# Patient Record
Sex: Female | Born: 1985 | Race: White | Hispanic: No | Marital: Married | Smoking: Never smoker
Health system: Southern US, Community
[De-identification: ages and names within clinical notes are randomized; demographics above are authoritative.]

## PROBLEM LIST (undated history)

## (undated) DIAGNOSIS — F419 Anxiety disorder, unspecified: Secondary | ICD-10-CM

## (undated) DIAGNOSIS — F32A Depression, unspecified: Secondary | ICD-10-CM

## (undated) DIAGNOSIS — G35D Multiple sclerosis, unspecified: Secondary | ICD-10-CM

## (undated) DIAGNOSIS — G35 Multiple sclerosis: Secondary | ICD-10-CM

## (undated) DIAGNOSIS — R87629 Unspecified abnormal cytological findings in specimens from vagina: Secondary | ICD-10-CM

## (undated) DIAGNOSIS — K589 Irritable bowel syndrome without diarrhea: Secondary | ICD-10-CM

## (undated) DIAGNOSIS — H539 Unspecified visual disturbance: Secondary | ICD-10-CM

## (undated) HISTORY — DX: Unspecified visual disturbance: H53.9

## (undated) HISTORY — PX: WISDOM TOOTH EXTRACTION: SHX21

## (undated) HISTORY — PX: TYMPANOPLASTY: SHX33

## (undated) HISTORY — PX: LEEP: SHX91

## (undated) HISTORY — PX: MYRINGOTOMY: SUR874

---
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
## 2007-02-09 ENCOUNTER — Other Ambulatory Visit: Admission: RE | Admit: 2007-02-09 | Discharge: 2007-02-09 | Payer: Self-pay | Admitting: Gynecology

## 2007-08-08 ENCOUNTER — Encounter: Admission: RE | Admit: 2007-08-08 | Discharge: 2007-08-08 | Payer: Self-pay | Admitting: Gastroenterology

## 2007-08-15 ENCOUNTER — Other Ambulatory Visit: Admission: RE | Admit: 2007-08-15 | Discharge: 2007-08-15 | Payer: Self-pay | Admitting: Gynecology

## 2008-04-24 ENCOUNTER — Other Ambulatory Visit: Admission: RE | Admit: 2008-04-24 | Discharge: 2008-04-24 | Payer: Self-pay | Admitting: Gynecology

## 2010-02-20 DIAGNOSIS — N760 Acute vaginitis: Secondary | ICD-10-CM | POA: Insufficient documentation

## 2010-02-20 DIAGNOSIS — D649 Anemia, unspecified: Secondary | ICD-10-CM | POA: Insufficient documentation

## 2010-02-20 DIAGNOSIS — K5900 Constipation, unspecified: Secondary | ICD-10-CM | POA: Insufficient documentation

## 2010-02-20 DIAGNOSIS — K589 Irritable bowel syndrome without diarrhea: Secondary | ICD-10-CM | POA: Insufficient documentation

## 2014-07-01 ENCOUNTER — Encounter: Payer: Self-pay | Admitting: Gastroenterology

## 2014-08-14 ENCOUNTER — Ambulatory Visit: Payer: Self-pay | Admitting: Gastroenterology

## 2016-03-27 ENCOUNTER — Encounter (HOSPITAL_COMMUNITY): Payer: Self-pay | Admitting: *Deleted

## 2016-03-27 ENCOUNTER — Ambulatory Visit (HOSPITAL_COMMUNITY)
Admission: EM | Admit: 2016-03-27 | Discharge: 2016-03-27 | Disposition: A | Discharge status: Nursing Home (Includes ICF) | Payer: BLUE CROSS/BLUE SHIELD | Source: Home / Self Care | Attending: Emergency Medicine | Admitting: Emergency Medicine

## 2016-03-27 ENCOUNTER — Emergency Department (HOSPITAL_COMMUNITY): Payer: BLUE CROSS/BLUE SHIELD

## 2016-03-27 ENCOUNTER — Emergency Department (HOSPITAL_COMMUNITY)
Admission: EM | Admit: 2016-03-27 | Discharge: 2016-03-28 | Disposition: A | Discharge status: Home / Self Care | Payer: BLUE CROSS/BLUE SHIELD | Attending: Emergency Medicine | Admitting: Emergency Medicine

## 2016-03-27 ENCOUNTER — Encounter (HOSPITAL_COMMUNITY): Payer: Self-pay | Admitting: Emergency Medicine

## 2016-03-27 DIAGNOSIS — R112 Nausea with vomiting, unspecified: Secondary | ICD-10-CM

## 2016-03-27 DIAGNOSIS — R197 Diarrhea, unspecified: Secondary | ICD-10-CM | POA: Insufficient documentation

## 2016-03-27 DIAGNOSIS — R102 Pelvic and perineal pain: Secondary | ICD-10-CM

## 2016-03-27 DIAGNOSIS — R1011 Right upper quadrant pain: Secondary | ICD-10-CM

## 2016-03-27 DIAGNOSIS — N83202 Unspecified ovarian cyst, left side: Secondary | ICD-10-CM | POA: Insufficient documentation

## 2016-03-27 DIAGNOSIS — E876 Hypokalemia: Secondary | ICD-10-CM | POA: Insufficient documentation

## 2016-03-27 DIAGNOSIS — R509 Fever, unspecified: Secondary | ICD-10-CM

## 2016-03-27 DIAGNOSIS — R1031 Right lower quadrant pain: Secondary | ICD-10-CM | POA: Insufficient documentation

## 2016-03-27 DIAGNOSIS — M545 Low back pain: Secondary | ICD-10-CM | POA: Insufficient documentation

## 2016-03-27 LAB — COMPREHENSIVE METABOLIC PANEL
ALBUMIN: 3.9 g/dL (ref 3.5–5.0)
ALT: 17 U/L (ref 14–54)
AST: 24 U/L (ref 15–41)
Alkaline Phosphatase: 55 U/L (ref 38–126)
Anion gap: 9 (ref 5–15)
BILIRUBIN TOTAL: 0.6 mg/dL (ref 0.3–1.2)
BUN: 7 mg/dL (ref 6–20)
CO2: 21 mmol/L — AB (ref 22–32)
Calcium: 8.5 mg/dL — ABNORMAL LOW (ref 8.9–10.3)
Chloride: 100 mmol/L — ABNORMAL LOW (ref 101–111)
Creatinine, Ser: 0.61 mg/dL (ref 0.44–1.00)
GFR calc Af Amer: >60 mL/min (ref >60)
GFR calc non Af Amer: >60 mL/min (ref >60)
GLUCOSE: 112 mg/dL — AB (ref 65–99)
POTASSIUM: 3.1 mmol/L — AB (ref 3.5–5.1)
SODIUM: 130 mmol/L — AB (ref 135–145)
TOTAL PROTEIN: 7 g/dL (ref 6.5–8.1)

## 2016-03-27 LAB — I-STAT BETA HCG BLOOD, ED (MC, WL, AP ONLY): I-stat hCG, quantitative: <5 m[IU]/mL (ref <5)

## 2016-03-27 LAB — POCT URINALYSIS DIP (DEVICE)
BILIRUBIN URINE: NEGATIVE
GLUCOSE, UA: NEGATIVE mg/dL
HGB URINE DIPSTICK: NEGATIVE
Ketones, ur: NEGATIVE mg/dL
LEUKOCYTES UA: NEGATIVE
NITRITE: NEGATIVE
Protein, ur: 30 mg/dL — AB
SPECIFIC GRAVITY, URINE: 1.02 (ref 1.005–1.030)
Urobilinogen, UA: 0.2 mg/dL (ref 0.0–1.0)
pH: 6 (ref 5.0–8.0)

## 2016-03-27 LAB — CBC
HEMATOCRIT: 36.7 % (ref 36.0–46.0)
Hemoglobin: 12.6 g/dL (ref 12.0–15.0)
MCH: 29.6 pg (ref 26.0–34.0)
MCHC: 34.3 g/dL (ref 30.0–36.0)
MCV: 86.2 fL (ref 78.0–100.0)
Platelets: 197 10*3/uL (ref 150–400)
RBC: 4.26 MIL/uL (ref 3.87–5.11)
RDW: 12.5 % (ref 11.5–15.5)
WBC: 5.9 10*3/uL (ref 4.0–10.5)

## 2016-03-27 LAB — WET PREP, GENITAL
SPERM: NONE SEEN
Trich, Wet Prep: NONE SEEN
Yeast Wet Prep HPF POC: NONE SEEN

## 2016-03-27 LAB — POCT PREGNANCY, URINE: PREG TEST UR: NEGATIVE

## 2016-03-27 LAB — LIPASE, BLOOD: Lipase: 123 U/L — ABNORMAL HIGH (ref 11–51)

## 2016-03-27 MED ORDER — ACETAMINOPHEN 325 MG PO TABS
650.0000 mg | ORAL_TABLET | Freq: Once | ORAL | Status: AC
  Administered 2016-03-27: 650 mg via ORAL

## 2016-03-27 MED ORDER — ACETAMINOPHEN 325 MG PO TABS
ORAL_TABLET | ORAL | Status: AC
  Filled 2016-03-27: qty 1

## 2016-03-27 MED ORDER — ONDANSETRON 4 MG PO TBDP
4.0000 mg | ORAL_TABLET | Freq: Once | ORAL | Status: AC
  Administered 2016-03-27: 4 mg via ORAL

## 2016-03-27 MED ORDER — ONDANSETRON 4 MG PO TBDP
ORAL_TABLET | ORAL | Status: AC
Start: 2016-03-27 — End: 2016-03-27
  Filled 2016-03-27: qty 1

## 2016-03-27 MED ORDER — IBUPROFEN 400 MG PO TABS
400.0000 mg | ORAL_TABLET | Freq: Once | ORAL | Status: AC
  Administered 2016-03-27: 400 mg via ORAL
  Filled 2016-03-27: qty 1

## 2016-03-27 NOTE — ED Triage Notes (Signed)
Reports feeling tight and crampy in lower abdomen.  reports burning with urination. Denies vaginal discharge.  Symptoms started yesterday.  Reports vomiting and diarrhea until 10 this morning.  Nausea continues.  Having general aches

## 2016-03-27 NOTE — Discharge Instructions (Signed)
Please go directly to the emergency room. This could be either your gallbladder or appendix.

## 2016-03-27 NOTE — ED Notes (Signed)
Pt ambulated to restroom independently and back to room

## 2016-03-27 NOTE — ED Triage Notes (Signed)
Sent from urgent care.  Pt had lower abdominal pain last week and then resolved and now with right lower abdominal pain.  Tender to right upper abdominal area.She was given tylenol for fever and headache and now head feels better. Sent here to rule out appy and chole.

## 2016-03-27 NOTE — ED Triage Notes (Signed)
Here for UTI sx onset last night associated w/dysuria, n/v, abd pain, BA, back pain, fevers, chills  A&O x4... NAD

## 2016-03-27 NOTE — ED Provider Notes (Signed)
[facility]    CSN: [id number] Arrival date & time: 03/27/16  [id number]     History   Chief Complaint Chief Complaint  Patient presents with  . Urinary Tract Infection    HPI Brittany PhiKelley D Johnston is a 30 y.o. female.   HPI She is a 30 year old woman here for evaluation of abdominal pain. She states her symptoms started last night with nausea, vomiting, and diarrhea. Today, she developed body aches and pain in her lower back. She reports a little bit of discomfort with urination. She states she is hurting the worst in her right lower abdomen. No vomiting or diarrhea since 10:00 this morning. She has been able to tolerate Gatorade and ginger ale. She is febrile here. She has not taken any medication prior to arrival.  No past medical history on file.  There are no active problems to display for this patient.   History reviewed. No pertinent surgical history.  OB History    No data available       Home Medications    Prior to Admission medications   Not on File    Family History No family history on file.  Social History Social History  Substance Use Topics  . Smoking status: Never Smoker  . Smokeless tobacco: Never Used  . Alcohol use Yes     Allergies   Patient has no known allergies.   Review of Systems Review of Systems As in history of present illness  Physical Exam Triage Vital Signs ED Triage Vitals [03/27/16 1921]  Enc Vitals Group     BP 108/67     Pulse Rate 110     Resp 20     Temp 102.2 F (39 C)     Temp Source Oral     SpO2 100 %     Weight      Height      Head Circumference      Peak Flow      Pain Score 8     Pain Loc      Pain Edu?      Excl. in GC?    No data found.   Updated Vital Signs BP 108/67 (BP Location: Left Arm)   Pulse 110   Temp 102.2 F (39 C) (Oral)   Resp 20   LMP 03/14/2016   SpO2 100%   Visual Acuity Right Eye Distance:   Left Eye Distance:   Bilateral Distance:    Right Eye Near:     Left Eye Near:    Bilateral Near:     Physical Exam  Constitutional: She is oriented to person, place, and time. She appears well-developed and well-nourished. No distress.  Looks ill  Cardiovascular: Regular rhythm and normal heart sounds.   No murmur heard. Tachycardic  Pulmonary/Chest: Effort normal and breath sounds normal. No respiratory distress. She has no wheezes. She has no rales.  Abdominal: Soft. Bowel sounds are normal. She exhibits no distension. There is tenderness (RUQ and RLQ). There is tenderness at McBurney's point. There is no rebound and no guarding.  No CVA tenderness  Neurological: She is alert and oriented to person, place, and time.     UC Treatments / Results  Labs (all labs ordered are listed, but only abnormal results are displayed) Labs Reviewed  POCT URINALYSIS DIP (DEVICE) - Abnormal; Notable for the following:       Result Value   Protein, ur 30 (*)    All other components within normal  limits  URINE CULTURE    EKG  EKG Interpretation None       Radiology No results found.  Procedures Procedures (including critical care time)  Medications Ordered in UC Medications  acetaminophen (TYLENOL) tablet 650 mg (650 mg Oral Given 03/27/16 1952)  ondansetron (ZOFRAN-ODT) disintegrating tablet 4 mg (4 mg Oral Given 03/27/16 2035)     Initial Impression / Assessment and Plan / UC Course  I have reviewed the triage vital signs and the nursing notes.  Pertinent labs & imaging results that were available during my care of the patient were reviewed by me and considered in my medical decision making (see chart for details).  Clinical Course     Given fever and right upper quadrant and right lower quadrant pain, transfer to the emergency room via private vehicle to rule out cholecystitis or appendicitis. Tylenol and Zofran given here prior to transfer.  Final Clinical Impressions(s) / UC Diagnoses   Final diagnoses:  Fever, unspecified fever  cause  RLQ abdominal pain  RUQ pain    New Prescriptions New Prescriptions   No medications on file     Charm Rings, MD 03/27/16 2037

## 2016-03-27 NOTE — ED Notes (Signed)
Pt requested pain medicine for her headache. Pt last took 650mg  of tylenol at 1930. Elpidio Anis notified and gave verbal order for 400mg  of ibuprofen.

## 2016-03-27 NOTE — ED Notes (Signed)
Offered to get provider to explain concerns

## 2016-03-27 NOTE — ED Notes (Signed)
Pregnancy test is negative

## 2016-03-28 ENCOUNTER — Other Ambulatory Visit (HOSPITAL_COMMUNITY): Payer: Self-pay

## 2016-03-28 ENCOUNTER — Other Ambulatory Visit (HOSPITAL_COMMUNITY): Payer: BLUE CROSS/BLUE SHIELD

## 2016-03-28 MED ORDER — METRONIDAZOLE 0.75 % VA GEL: 1.0000 | Freq: Two times a day (BID) | VAGINAL | 0 refills | Status: DC

## 2016-03-28 MED ORDER — POTASSIUM CHLORIDE CRYS ER 20 MEQ PO TBCR: 20.0000 meq | EXTENDED_RELEASE_TABLET | Freq: Two times a day (BID) | ORAL | 0 refills | Status: DC

## 2016-03-28 MED ORDER — TRAMADOL HCL 50 MG PO TABS: 50.0000 mg | ORAL_TABLET | Freq: Four times a day (QID) | ORAL | 0 refills | Status: DC | PRN

## 2016-03-28 MED ORDER — NAPROXEN 500 MG PO TABS
500.0000 mg | ORAL_TABLET | Freq: Two times a day (BID) | ORAL | 0 refills | Status: DC
Start: 2016-03-28 — End: 2016-06-28

## 2016-03-28 NOTE — ED Notes (Signed)
Pt transported to US

## 2016-03-28 NOTE — ED Provider Notes (Signed)
[facility]   CSN: [id number] Arrival date & time: 03/27/16  2039     History   Chief Complaint Chief Complaint  Patient presents with  . Fever  . Abdominal Pain  . Nausea    HPI Brittany Johnston is a 30 y.o. female.  Patient presents with complaint of lower abdominal discomfort that started yesterday. No nausea, vomiting or diarrhea. She developed a fever with a reported Tmax of 102. No congestion, cough, sore throat. She localizes the abdominal discomfort to the lower abdomen. No vaginal discharge, dysuria or frequency. She also had pain across the low back. She was seen at Urgent Care prior to arrival and reports that on his exam she had tenderness in the RUQ. No aggravating or alleviating factors.    The history is provided by the patient. No language interpreter was used.  Fever   Pertinent negatives include no diarrhea and no vomiting.  Abdominal Pain   Associated symptoms include fever. Pertinent negatives include diarrhea, nausea, vomiting and dysuria.    History reviewed. No pertinent past medical history.  There are no active problems to display for this patient.   Past Surgical History:  Procedure Laterality Date  . LEEP     suppose to have follow up of cervical cells in January    OB History    No data available       Home Medications    Prior to Admission medications   Not on File    Family History No family history on file.  Social History Social History  Substance Use Topics  . Smoking status: Never Smoker  . Smokeless tobacco: Never Used  . Alcohol use Yes     Allergies   Patient has no known allergies.   Review of Systems Review of Systems  Constitutional: Positive for fever. Negative for chills.  HENT: Negative.   Respiratory: Negative.   Cardiovascular: Negative.   Gastrointestinal: Positive for abdominal pain. Negative for diarrhea, nausea and vomiting.  Genitourinary: Negative.  Negative for dysuria,  menstrual problem, pelvic pain and vaginal discharge.  Musculoskeletal: Positive for back pain.  Skin: Negative.   Neurological: Negative.      Physical Exam Updated Vital Signs BP 113/71   Pulse 83   Temp 98.7 F (37.1 C) (Oral)   Resp 16   Wt 66.1 kg   LMP 03/14/2016   SpO2 96%   Physical Exam  Constitutional: She appears well-developed and well-nourished.  HENT:  Head: Normocephalic.  Neck: Normal range of motion. Neck supple.  Cardiovascular: Normal rate and regular rhythm.   Pulmonary/Chest: Effort normal and breath sounds normal. She has no wheezes. She has no rales.  Abdominal: Soft. Bowel sounds are normal. There is no tenderness. There is no rebound and no guarding.  Genitourinary:  Genitourinary Comments: There is tenderness to cervix and a yellowish vaginal discharge present. Cervix is otherwise unremarkable in appearance. No adnexal mass or tenderness.   Musculoskeletal: Normal range of motion.  Neurological: She is alert. No cranial nerve deficit.  Skin: Skin is warm and dry.  Psychiatric: She has a normal mood and affect.     ED Treatments / Results  Labs (all labs ordered are listed, but only abnormal results are displayed) Labs Reviewed  WET PREP, GENITAL - Abnormal; Notable for the following:       Result Value   Clue Cells Wet Prep HPF POC PRESENT (*)    WBC, Wet Prep HPF POC MODERATE (*)  All other components within normal limits  LIPASE, BLOOD - Abnormal; Notable for the following:    Lipase 123 (*)    All other components within normal limits  COMPREHENSIVE METABOLIC PANEL - Abnormal; Notable for the following:    Sodium 130 (*)    Potassium 3.1 (*)    Chloride 100 (*)    CO2 21 (*)    Glucose, Bld 112 (*)    Calcium 8.5 (*)    All other components within normal limits  CBC  I-STAT BETA HCG BLOOD, ED ([facility], [facility], [facility] ONLY)  GC/CHLAMYDIA PROBE AMP ([place]) NOT AT [facility]   Results for orders placed or performed during the hospital  encounter of 03/27/16  Wet prep, genital  Result Value Ref Range   Yeast Wet Prep HPF POC NONE SEEN NONE SEEN   Trich, Wet Prep NONE SEEN NONE SEEN   Clue Cells Wet Prep HPF POC PRESENT (A) NONE SEEN   WBC, Wet Prep HPF POC MODERATE (A) NONE SEEN   Sperm NONE SEEN   Lipase, blood  Result Value Ref Range   Lipase 123 (H) 11 - 51 U/L  Comprehensive metabolic panel  Result Value Ref Range   Sodium 130 (L) 135 - 145 mmol/L   Potassium 3.1 (L) 3.5 - 5.1 mmol/L   Chloride 100 (L) 101 - 111 mmol/L   CO2 21 (L) 22 - 32 mmol/L   Glucose, Bld 112 (H) 65 - 99 mg/dL   BUN 7 6 - 20 mg/dL   Creatinine, Ser 4.09 0.44 - 1.00 mg/dL   Calcium 8.5 (L) 8.9 - 10.3 mg/dL   Total Protein 7.0 6.5 - 8.1 g/dL   Albumin 3.9 3.5 - 5.0 g/dL   AST 24 15 - 41 U/L   ALT 17 14 - 54 U/L   Alkaline Phosphatase 55 38 - 126 U/L   Total Bilirubin 0.6 0.3 - 1.2 mg/dL   GFR calc non Af Amer >60 >60 mL/min   GFR calc Af Amer >60 >60 mL/min   Anion gap 9 5 - 15  CBC  Result Value Ref Range   WBC 5.9 4.0 - 10.5 K/uL   RBC 4.26 3.87 - 5.11 MIL/uL   Hemoglobin 12.6 12.0 - 15.0 g/dL   HCT 81.1 91.4 - 78.2 %   MCV 86.2 78.0 - 100.0 fL   MCH 29.6 26.0 - 34.0 pg   MCHC 34.3 30.0 - 36.0 g/dL   RDW 95.6 21.3 - 08.6 %   Platelets 197 150 - 400 K/uL  I-Stat beta hCG blood, ED  Result Value Ref Range   I-stat hCG, quantitative <5.0 <5 mIU/mL   Comment 3            EKG  EKG Interpretation None       Radiology No results found. US Transvaginal Non-ob  Result Date: 03/28/2016 CLINICAL DATA:  Acute onset of lower pelvic pain. Initial encounter. EXAM: TRANSABDOMINAL AND TRANSVAGINAL ULTRASOUND OF PELVIS TECHNIQUE: Both transabdominal and transvaginal ultrasound examinations of the pelvis were performed. Transabdominal technique was performed for global imaging of the pelvis including uterus, ovaries, adnexal regions, and pelvic cul-de-sac. It was necessary to proceed with endovaginal exam following the  transabdominal exam to visualize the endometrium. COMPARISON:  None FINDINGS: Uterus Measurements: 7.3 x 3.6 x 5.7 cm. No fibroids or other mass visualized. Endometrium Thickness: 0.8 cm.  No focal abnormality visualized. Right ovary Measurements: 3.8 x 1.4 x 2.3 cm. Normal appearance/no adnexal mass. Left ovary Measurements: 4.0  x 3.7 x 3.1 cm. A complex 3.1 x 3.1 x 2.2 cm cyst is noted at the left ovary, with internal echoes. The appearance suggests a hemorrhagic cyst. Other findings Trace free fluid is seen within the pelvic cul-de-sac. IMPRESSION: 1. Suggestion of 3.1 cm hemorrhagic cyst at the left ovary. 2. Otherwise unremarkable pelvic ultrasound. No evidence for ovarian torsion. Electronically Signed   By: Roanna Raider M.D.   On: 03/28/2016 01:04   US Pelvis Complete  Result Date: 03/28/2016 CLINICAL DATA:  Acute onset of lower pelvic pain. Initial encounter. EXAM: TRANSABDOMINAL AND TRANSVAGINAL ULTRASOUND OF PELVIS TECHNIQUE: Both transabdominal and transvaginal ultrasound examinations of the pelvis were performed. Transabdominal technique was performed for global imaging of the pelvis including uterus, ovaries, adnexal regions, and pelvic cul-de-sac. It was necessary to proceed with endovaginal exam following the transabdominal exam to visualize the endometrium. COMPARISON:  None FINDINGS: Uterus Measurements: 7.3 x 3.6 x 5.7 cm. No fibroids or other mass visualized. Endometrium Thickness: 0.8 cm.  No focal abnormality visualized. Right ovary Measurements: 3.8 x 1.4 x 2.3 cm. Normal appearance/no adnexal mass. Left ovary Measurements: 4.0 x 3.7 x 3.1 cm. A complex 3.1 x 3.1 x 2.2 cm cyst is noted at the left ovary, with internal echoes. The appearance suggests a hemorrhagic cyst. Other findings Trace free fluid is seen within the pelvic cul-de-sac. IMPRESSION: 1. Suggestion of 3.1 cm hemorrhagic cyst at the left ovary. 2. Otherwise unremarkable pelvic ultrasound. No evidence for ovarian torsion.  Electronically Signed   By: Roanna Raider M.D.   On: 03/28/2016 01:04    Procedures Procedures (including critical care time)  Medications Ordered in ED Medications  ibuprofen (ADVIL,MOTRIN) tablet 400 mg (400 mg Oral Given 03/27/16 2352)     Initial Impression / Assessment and Plan / ED Course  I have reviewed the triage vital signs and the nursing notes.  Pertinent labs & imaging results that were available during my care of the patient were reviewed by me and considered in my medical decision making (see chart for details).  Clinical Course     Patient presents from Urgent Care with concern for lower abdominal pain and possible appendicitis.   On my exam, she is currently nontender to palpation in upper or lower abdomen. There is tenderness on pelvic exam and a vaginal discharge present. She is known to have cervical dysplasia, currently under GYN care.   Pelvic US was ordered to more fully evaluate. Will reassess after results.  Patient's US showing a left hemorrhagic ovarian cyst. She has been comfortable. No leukocytosis. No RUQ (or RLQ) tenderness. Doubt appendicitis or involvement of gall bladder. There is a cyst present and known cervical dysplasia. The patient is felt stable for discharge home. Return precautions of fever, significant RLQ tenderness, vomiting, were discussed. Patient and family comfortable with discharge.   Final Clinical Impressions(s) / ED Diagnoses   Final diagnoses:  Pelvic pain    New Prescriptions New Prescriptions   No medications on file     Danne Harbor 04/01/16 2311    Lyndal Pulley, MD 04/02/16 1309

## 2016-03-28 NOTE — Discharge Instructions (Signed)
Treat the fever with ibuprofen and/or tylenol. Take prescriptions as directed and return to the hospital if you have worsening symptoms.

## 2016-03-28 NOTE — ED Notes (Signed)
Pt verbalized understanding of d/c instructions and has no further questions. Pt stable and NAD. Pt to follow up with obgyn. VSS.

## 2016-03-29 LAB — URINE CULTURE

## 2016-03-31 LAB — GC/CHLAMYDIA PROBE AMP (~~LOC~~) NOT AT ARMC
Chlamydia: NEGATIVE
NEISSERIA GONORRHEA: NEGATIVE

## 2016-06-03 ENCOUNTER — Other Ambulatory Visit: Payer: Self-pay | Admitting: Ophthalmology

## 2016-06-03 DIAGNOSIS — H04129 Dry eye syndrome of unspecified lacrimal gland: Secondary | ICD-10-CM

## 2016-06-03 DIAGNOSIS — H16102 Unspecified superficial keratitis, left eye: Secondary | ICD-10-CM

## 2016-06-03 DIAGNOSIS — H5712 Ocular pain, left eye: Secondary | ICD-10-CM

## 2016-06-07 ENCOUNTER — Telehealth: Payer: Self-pay | Admitting: Neurology

## 2016-06-07 ENCOUNTER — Ambulatory Visit: Payer: Self-pay | Admitting: Neurology

## 2016-06-07 NOTE — Telephone Encounter (Signed)
This patient did not show for a new patient appointment today. 

## 2016-06-08 ENCOUNTER — Encounter: Payer: Self-pay | Admitting: Neurology

## 2016-06-21 ENCOUNTER — Telehealth: Payer: Self-pay | Admitting: *Deleted

## 2016-06-21 ENCOUNTER — Encounter: Payer: Self-pay | Admitting: *Deleted

## 2016-06-21 NOTE — Telephone Encounter (Signed)
Brittany Johnston called back stating she can come in to see Dr. Epimenio Foot on 06-28-16 at 1:30.

## 2016-06-21 NOTE — Telephone Encounter (Signed)
LMTC.  RAS has reviewed her MRI and would like for her to come in sooner than 07-12-16.  Will offer w/i appt. 06-28-16 at 1330, arrival time of 1300.  I have already reserved this time for her, and her March appt. remains as well, so just need to cancel whichever appt. she is not going to keep/fim

## 2016-06-21 NOTE — Telephone Encounter (Signed)
Noted/fim 

## 2016-06-28 ENCOUNTER — Encounter: Payer: Self-pay | Admitting: *Deleted

## 2016-06-28 ENCOUNTER — Ambulatory Visit (INDEPENDENT_AMBULATORY_CARE_PROVIDER_SITE_OTHER): Payer: BLUE CROSS/BLUE SHIELD | Admitting: Neurology

## 2016-06-28 ENCOUNTER — Encounter: Payer: Self-pay | Admitting: Neurology

## 2016-06-28 DIAGNOSIS — F329 Major depressive disorder, single episode, unspecified: Secondary | ICD-10-CM | POA: Diagnosis not present

## 2016-06-28 DIAGNOSIS — H469 Unspecified optic neuritis: Secondary | ICD-10-CM

## 2016-06-28 DIAGNOSIS — F32A Depression, unspecified: Secondary | ICD-10-CM

## 2016-06-28 DIAGNOSIS — Z79899 Other long term (current) drug therapy: Secondary | ICD-10-CM

## 2016-06-28 DIAGNOSIS — G35 Multiple sclerosis: Secondary | ICD-10-CM | POA: Diagnosis not present

## 2016-06-28 DIAGNOSIS — G35D Multiple sclerosis, unspecified: Secondary | ICD-10-CM | POA: Insufficient documentation

## 2016-06-28 HISTORY — DX: Unspecified optic neuritis: H46.9

## 2016-06-28 NOTE — Progress Notes (Signed)
[facility]  PATIENT: Brittany Johnston DOB: [birth year]  REFERRING DOCTOR OR PCP:  Aura Camps [facility])    Fax [phone] SOURCE: Patient, , records from [facility], MRI and lab reports, MRI images on PACS.  _________________________________   HISTORICAL  CHIEF COMPLAINT:  Chief Complaint  Patient presents with  . Abnormal MRI    Brittany Johnston is here wit hher mother Brittany Johnston to discuss abnormal MRI brain. Sts. onset of left eye pain mid January.  Opthalmologist dx. optic neuritis and sent her for an MRI brain, which is suspicious for MS.  Has had 5 dayas of IV steroids at [facility].  No LP.  Here to confirm dx. of MS. Denies eye pain but sts. vision is not back to baseline/fim    HISTORY OF PRESENT ILLNESS:  I had the pleasure seeing you patient, Brittany Johnston, at [facility] for neurologic consultation regarding her recent optic neuritis and abnormal MRI.  Around 05/20/2016, she had the onset of left eye pain, worse with movement.   Symptoms started while driving and she noted looking to merge was painful. She saw her ophthalmologist and she had an MRI 06/03/2016 showing optic nerve enhancement on the left and a second focus in the right hemisphere that also enhanced.     She noted mild blurry vision shortly after that but it seemed worse 06/05/2016 and the next day, vision was worse.    She went to the [facility] ED and was admitted for 5 days of IV Solu-Medrol (3 days in hospital, 2 as outpatient).  There, she had an MRI of the cervical spine that showed 2 more lesions (at C4C5 and T1) consistent with MS.  In retrospect, a few weeks before the visual symptoms, she noted right hand numbness, especially if she hold her phone which is persisting.   , She saw a holistic medicine doctor who told her that she was deficient and vitamin D and was hypothyroid and she was prescribed supplements. She was also prescribed low-dose naltrexone.    She saw Dr. Harlen Labs at  [facility].   Tysabri and ocrelizumab were discussed as possible treatments. Some initial blood work was performed and she is JCV antibody negative.   She is hepatitis B negative.  In early to mid January, she had the flu and was treated with Tamiflu. She was feeling better when her vision symptoms occurred.    Gait/strength/sensation:   She tripped a few times over the last week and felt less steady climbing the attic ladder recently.   However, she feels that strength and sensation are normal and symmetric in the legs. She has not noted clumsiness in the arms or legs. She does note some numbness in the right hand that comes and goes but has been occurring over the last month.  Bladder/Bowel:   She denies any bladder urgency, frequency or incontinence. She does not have hesitancy.   She has constipation and had a coloscopy at age 89.  Fatigue/Sleep:   She denies much fatigue.   She has some trouble falling asleep.  Once asleep, she sleeps well.  Mood/Cognition:  She feels depressed and has had some crying spells, worse since the diagnosis.  She would prefer not to go on an antidepressant at this point.   She feels she does not remember as well as she used to.    She feels focus and attention are worse.  There is no family history of MS or other autoimmune disease.  I personally reviewed the MRI of the brain dated 06/03/2016 and the MRI of the cervical spine dated 06/08/2016.   The MRI of the brain showed enhancement of the left optic nerve. Additionally in the right temporal lobes there is another T2/FLAIR hyperintense focus that enhances. Additionally there is a periventricular right cerebellar focus that does not enhance. I also reviewed the MRI of the cervical spine. At C3-C4 there is a right lateral focus with subtle enhancement. There is also a right posterolateral focus adjacent to T1 that does not enhance.   Lab reports from her recent hospital stay were also  evaluated. He is JCV antibody negative. Anti-NMO is negative.    REVIEW OF SYSTEMS: Constitutional: No fevers, chills, sweats, or change in appetite Eyes: No visual changes, double vision, eye pain Ear, nose and throat: No hearing loss, ear pain, nasal congestion, sore throat Cardiovascular: No chest pain, palpitations Respiratory: No shortness of breath at rest or with exertion.   No wheezes GastrointestinaI: No nausea, vomiting, diarrhea, abdominal pain, fecal incontinence,    Has constipation Genitourinary: No dysuria, urinary retention or frequency.  No nocturia.  Has had some yeast infections.  Musculoskeletal: No neck pain, back pain Integumentary: No rash, pruritus, skin lesions Neurological: as above Psychiatric: Noting some depression, worse with recent symptoms.   Endocrine: No palpitations, diaphoresis, change in appetite, change in weigh or increased thirst Hematologic/Lymphatic: No anemia, purpura, petechiae. Allergic/Immunologic: No itchy/runny eyes, nasal congestion, recent allergic reactions, rashes  ALLERGIES: No Known Allergies  HOME MEDICATIONS:  Current Outpatient Prescriptions:  .  Cholecalciferol (VITAMIN D3) 5000 units CAPS, Take by mouth., Disp: , Rfl:  .  lactobacillus acidophilus (BACID) TABS tablet, Take 2 tablets by mouth 3 (three) times daily., Disp: , Rfl:  .  UNABLE TO FIND, Methylcobalamin 1 mg daily, Disp: , Rfl:  .  zinc gluconate 50 MG tablet, Take 50 mg by mouth., Disp: , Rfl:  .  natalizumab (TYSABRI) 300 MG/15ML injection, Inject 300 mg into the vein every 30 (thirty) days., Disp: , Rfl:   PAST MEDICAL HISTORY: Past Medical History:  Diagnosis Date  . Vision abnormalities     PAST SURGICAL HISTORY: Past Surgical History:  Procedure Laterality Date  . LEEP     suppose to have follow up of cervical cells in January    FAMILY HISTORY: Family History  Problem Relation Age of Onset  . Heart disease Mother   . Healthy Father      SOCIAL HISTORY:  Social History   Social History  . Marital status: Single    Spouse name: N/A  . Number of children: N/A  . Years of education: N/A   Occupational History  . Not on file.   Social History Main Topics  . Smoking status: Never Smoker  . Smokeless tobacco: Never Used  . Alcohol use Yes     Comment: occasional  . Drug use: No  . Sexual activity: Not on file   Other Topics Concern  . Not on file   Social History Narrative  . No narrative on file     PHYSICAL EXAM  Vitals:   06/28/16 1326  BP: 104/75  Pulse: 77  Resp: 14  Weight: 134 lb (60.8 kg)  Height: 5\' 5"  (1.651 m)    Body mass index is 22.3 kg/m.   General: The patient is well-developed and well-nourished and in no acute distress  Eyes:  Funduscopic exam shows mild optic nerve edema OS and normal OD optic discs and  normal OU retinal vessels.   She is 20/100 OS and 20/30 OD with glasses  Neck: The neck is supple, no carotid bruits are noted.  The neck is nontender.  Cardiovascular: The heart has a regular rate and rhythm with a normal S1 and S2. There were no murmurs, gallops or rubs. Lungs are clear to auscultation.  Skin: Extremities are without significant edema.  Musculoskeletal:  Back is nontender  Neurologic Exam  Mental status: The patient is alert and oriented x 3 at the time of the examination. The patient has apparent normal recent and remote memory, with an apparently normal attention span and concentration ability.   Speech is normal.  Cranial nerves: Extraocular movements are full. She has a 2+ APD.   Left color vision is reduced.  Visual fields are full.  Facial symmetry is present. There is good facial sensation to soft touch bilaterally.Facial strength is normal.  Trapezius and sternocleidomastoid strength is normal. No dysarthria is noted.  The tongue is midline, and the patient has symmetric elevation of the soft palate. No obvious hearing deficits are  noted.  Motor:  Muscle bulk is normal.   Tone is normal. Strength is  5 / 5 in all 4 extremities.   Sensory: Sensory testing is intact to pinprick, soft touch and vibration sensation in all 4 extremities.  Coordination: Cerebellar testing reveals good finger-nose-finger and heel-to-shin bilaterally.  Gait and station: Station is normal.   Gait is normal. Tandem gait is normal. Romberg is negative.   Reflexes: Deep tendon reflexes are symmetric and normal bilaterally.   Plantar responses are flexor.     DIAGNOSTIC DATA (LABS, IMAGING, TESTING) - I reviewed patient records, labs, notes, testing and imaging myself where available.  Lab Results  Component Value Date   WBC 5.9 03/27/2016   HGB 12.6 03/27/2016   HCT 36.7 03/27/2016   MCV 86.2 03/27/2016   PLT 197 03/27/2016      Component Value Date/Time   NA 130 (L) 03/27/2016 2054   K 3.1 (L) 03/27/2016 2054   CL 100 (L) 03/27/2016 2054   CO2 21 (L) 03/27/2016 2054   GLUCOSE 112 (H) 03/27/2016 2054   BUN 7 03/27/2016 2054   CREATININE 0.61 03/27/2016 2054   CALCIUM 8.5 (L) 03/27/2016 2054   PROT 7.0 03/27/2016 2054   ALBUMIN 3.9 03/27/2016 2054   AST 24 03/27/2016 2054   ALT 17 03/27/2016 2054   ALKPHOS 55 03/27/2016 2054   BILITOT 0.6 03/27/2016 2054   GFRNONAA >60 03/27/2016 2054   GFRAA >60 03/27/2016 2054       ASSESSMENT AND PLAN  Multiple sclerosis (HCC) - Plan: Quantiferon tb gold assay (blood), Pan-ANCA, TSH  Optic neuritis - Plan: Quantiferon tb gold assay (blood), Pan-ANCA, TSH  High risk medication use  Depression, unspecified depression type   In summary, Ms. Christell Constant is a 31 year old woman with left optic neuritis who has MRIs of the brain and spinal cord consistent with multiple sclerosis. She has evidence of dissemination in space and time and has two lesions in the spinal cord, 2 in the brain (one adjacent to the fourth ventricle in the cerebellum and one right temporal enhancing lesion) and left  optic nerve enhancement.    She is better since receiving IV steroids but has not returned to baseline. He had a very long discussion today about MS symptoms, and treatment options. Because she has two lesions in the spinal cord, I believe we need to begin with an higher efficacious  medication as her first disease modifying therapy and recommend that we start either ocrelizumab or Tysabri. She is JCV antibody negative and I feel that Tysabri may be her best beginning option. She signed the service request form and we will send it in. She does have some depression which is partially reactive to her recent diagnosis if symptoms worsen any or do not improve over the next month or 2 I would recommend that she begin an antidepressant or seek counseling/psychology.     I will check a pan--Anka tests to make sure that she does not have polyarteritis nodosa that can mimic MS. I will also check a TSH as she was told she might have hypothyroidism.    She is advised to take vitamin D 5000 units daily.      She will return to see me for a regular follow-up visit in 3 months but return sooner for her infusions. She should call us if she has any new or worsening neurologic symptoms.  Thank you for asking me to see Mrs. Moore. Please let me know if I can be of further assistance with her or other patients in the future.     Charels Stambaugh A. Epimenio Foot, MD, PhD 06/28/2016, 4:00 PM Certified in Neurology, Clinical Neurophysiology, Sleep Medicine, Pain Medicine and Neuroimaging  [facility] [address], [address] [place], [place] [address] [phone]

## 2016-06-29 LAB — PAN-ANCA
ANCA Proteinase 3: <3.5 U/mL (ref 0.0–3.5)
Atypical pANCA: <1:20 {titer}
C-ANCA: <1:20 {titer}

## 2016-06-29 LAB — TSH: TSH: 1.48 u[IU]/mL (ref 0.450–4.500)

## 2016-07-01 LAB — QUANTIFERON IN TUBE
QFT TB AG MINUS NIL VALUE: 0 [IU]/mL
QUANTIFERON MITOGEN VALUE: 5.24 IU/mL
QUANTIFERON NIL VALUE: 0.02 [IU]/mL
QUANTIFERON TB AG VALUE: 0.02 [IU]/mL
QUANTIFERON TB GOLD: NEGATIVE

## 2016-07-01 LAB — QUANTIFERON TB GOLD ASSAY (BLOOD)

## 2016-07-02 ENCOUNTER — Telehealth: Payer: Self-pay | Admitting: *Deleted

## 2016-07-02 NOTE — Telephone Encounter (Signed)
-----   Message from Asa Lente, MD sent at 07/01/2016  6:49 PM EST ----- Please let her know that the pan-ANCA test was negative. The thyroid test was normal.   If we have not already done so we can send in the Tysabri form.

## 2016-07-02 NOTE — Telephone Encounter (Signed)
I have spoken with Brittany Johnston this morning and per RAS, reviewed lab results as below.  Tysabri form has already been completed and given to [place] in the infusion suite.  Pt. verbalized understanding of same and sts. she has already been contacted by [employer] and told that ins. approved Ty and she will be receiving a call to schedule/fim

## 2016-07-12 ENCOUNTER — Ambulatory Visit: Payer: Self-pay | Admitting: Neurology

## 2016-07-20 NOTE — Telephone Encounter (Signed)
Pt called stating that her insurance company is not approving the [facility]. Pt states that Dr Epimenio Foot would have to call them to schedule a Peer to Peer with their physician. Pt would like a call with outcome

## 2016-07-20 NOTE — Telephone Encounter (Signed)
Message printed and given to Tina in the infusion suite/fim 

## 2016-07-23 ENCOUNTER — Telehealth: Payer: Self-pay | Admitting: Neurology

## 2016-07-23 NOTE — Telephone Encounter (Signed)
I called [phone] to do Peer to Peer.    I was told Med Director who reviewed her case will not be available until Tuesday.  As she has an aggressive MS (MRI shows 2 enhancing lesions and she also has two foci in her spine), I requested review earlier.

## 2016-07-26 NOTE — Telephone Encounter (Signed)
I did "peer to peer" with a Wellsite geologist [phone]).    Dr. Luiz Blare     I discussed her aggressive features including 2 plaques in her spinal cord and 1 plaque in her cerebellum and there is medical necessity for a highly effective disease modifying therapy such as Tysabri.  .     She was unable to authorize Tysabri as their policy states that the patient should begin with less effective medications such as Copaxone.    She recommended that I appeal on behalf of the patient with an expedited review by a neurologist to [phone]  I called that number (10-15 min wait) and was put through to a total of 3 different people over the next 45 minutes without resolution of the issue. Eventually, I was given the phone #[phone] that I had spoken to on Friday to set up the initial peer-to-peer.     We'll try that number again tomorrow as they are closed right now.

## 2016-07-27 NOTE — Telephone Encounter (Signed)
I have spoken with Brittany Johnston again today--she sts. she has spoken with her ins. co. and has faxed them a release allowing RAS to act on her behalf in Tysabri appeal/fim

## 2016-07-27 NOTE — Telephone Encounter (Signed)
Yesterday I spoke to a Wellsite geologist (Dr. Luiz Blare) who is not a neurologist and she upheld the denial of Tysabri.   I then spoke to 3-4 more people over an hour and got nowhere on initiating her member appeal.  Today:  I called [phone]  At 915 am - -- I got  voicemail and LM for them to call me so I can schedule peer to peer with neurologist.  Id did not get a return call.  I called again at 12:50 and got through to a live person  In order for me to have her denial reviewedby a neurologist, the patient has to give permission for [place] to act on her behalf for a 'member appeal of her denial'.  She needs to call [phone] and let them know she wants to do a "member appeal for the denial of Tysabri" and that I can speak to them on her behalf.   (if she calls we should be able to have this occur much faster than if she does by mail)  Please call her and let her know the above and for her to use those words

## 2016-07-27 NOTE — Telephone Encounter (Signed)
Patient requesting a call to discuss as in previous message permission for Dr. Epimenio Foot to speak to ins. company on her behalf regarding an appeal for denial of Tysabri.

## 2016-07-27 NOTE — Telephone Encounter (Signed)
I have spoken with Brittany Johnston and per RAS, reviewed information with her.  She will call her ins. co. and give RAS permission to act on her behalf for a member appeal for the denial of Tysabri, and let me know once she has done this/fim

## 2016-07-28 NOTE — Telephone Encounter (Signed)
I lmom identified vm for the med. director's office (phone# [phone]), requesting a call back for Dr. Epimenio Foot to schedule expedited review with a neurologist for pt's Tysabri.  Left message that this is an urgent request, as pt. is not currently on any dmt, and therefore is at risk for relapse.  I left office # and also my cell phone #/fim

## 2016-08-02 NOTE — Telephone Encounter (Signed)
Patient calling to follow up on Tysabri appeal. She also states her right foot has been numb  X 2 days.

## 2016-08-03 NOTE — Telephone Encounter (Signed)
I have spoken with Brittany Johnston in the Med. Director's office, who sts. we must speak with someone in the appeals office.  She transferred me to a #, where I was on hold for 20 min.  Brittany Johnston then came back to the phone and stated that appeal has not been started, that pt. must refax hippa info to them.  She requested pt's phone #, which I have provided./fim

## 2016-08-03 NOTE — Telephone Encounter (Signed)
I have spoken with Brittany Johnston at [facility] [employer], who sts. pt. does not have rx. coverage with them. I then lmom for the med. director's office [phone]) requesting urgent call back, as pt. is currently experiencing active MS sx, has not been able to start a dmt due to insurance non-responsiveness./fim

## 2016-08-03 NOTE — Telephone Encounter (Signed)
I have spoken with Anisha L. at [facility] and req. to speak with the legal dept., in order to file a complaint in ref. to ins. companies lack of response to urgent appeal, leaving pt's MS untreated.  Anisha placed me on hold, then returned to the phone and sts. we are not able to speak with anyone in the legal dept. and only recourse is to go thru provider services. I have spoken with [name] and advised that [place], in the med. director's office should have contacted her this morning to ask her to refax hippa release.  She sts. nobody has contacted her.  She will contact her insurance co. now to see if she can expedite appeal/fim

## 2016-08-04 NOTE — Telephone Encounter (Signed)
Pt called to inform that she spoke with the insurance company on yesterday and she was told that they do not have record of Dr Epimenio Foot calling to appeal the denial of covering the Tysabri .  Pt said she was told that by calling on yesterday she initiated an appeal, she was told that a packet is being sent to her with the name and direct # of someone to assist in this, pt said when she gets this information she will call back and provide it.

## 2016-08-04 NOTE — Telephone Encounter (Signed)
I have spoken with Brittany Johnston this afternoon.  She sts. she is very frustrated--that her ins. company denied speaking with Dr. Epimenio Foot, denied that a request for appeal of Tysabri denial had been made. Sts. they denied receiving faxed consent from [place] last week, giving Dr. Epimenio Foot permission to appeal Tysabri denial on her behalf; that she would have to refax connsent, and that appeal process would take up to 30 days.  Pt. sts. she requested to speak with the legal dept. to file a complaint, but was told only attorneys can speak with legal dept.  Will speak with RAS and call pt. back, as he will not want her to go another month or longer without dmt for MS./fim

## 2016-08-04 NOTE — Telephone Encounter (Signed)
I have spoken with Brittany Johnston this evening and given w/I appt. with RAS tomrrow am 0930, to discuss tx. options/fim

## 2016-08-04 NOTE — Telephone Encounter (Signed)
I have made multiple phone calls to [facility] this afternoon, to try and determine what MS dmt they will approve immediately, as RAS wants to get pt. started on a therapy asap.  I have spoken with [employer], with request that someone review their formulary with me to see what would be covered.  Online, there are 3 formulary choices--I do not know which one pt. has access to.  Dr. Epimenio Foot has reviewed what he has been able to find online, but meds on the formulary do not appear to be up to date. (For ex., Su Hilt is on the formulary, and it is no longer on the market.)  I have expressed extreme frustration that this one question can't be answered by anyone I have spoken with, even Tamela Oddi in the pharmacy. Nobody has been able to even give approved MS dmt.  RAS is aware./fim

## 2016-08-05 ENCOUNTER — Encounter: Payer: Self-pay | Admitting: Neurology

## 2016-08-05 ENCOUNTER — Telehealth: Payer: Self-pay | Admitting: *Deleted

## 2016-08-05 ENCOUNTER — Ambulatory Visit (INDEPENDENT_AMBULATORY_CARE_PROVIDER_SITE_OTHER): Payer: BLUE CROSS/BLUE SHIELD | Admitting: Neurology

## 2016-08-05 ENCOUNTER — Encounter (INDEPENDENT_AMBULATORY_CARE_PROVIDER_SITE_OTHER): Payer: Self-pay

## 2016-08-05 VITALS — BP 114/73 | HR 69 | Resp 20 | Ht 65.0 in | Wt 136.0 lb

## 2016-08-05 DIAGNOSIS — H469 Unspecified optic neuritis: Secondary | ICD-10-CM

## 2016-08-05 DIAGNOSIS — G373 Acute transverse myelitis in demyelinating disease of central nervous system: Secondary | ICD-10-CM | POA: Diagnosis not present

## 2016-08-05 DIAGNOSIS — G35 Multiple sclerosis: Secondary | ICD-10-CM

## 2016-08-05 DIAGNOSIS — R200 Anesthesia of skin: Secondary | ICD-10-CM

## 2016-08-05 HISTORY — DX: Acute transverse myelitis in demyelinating disease of central nervous system: G37.3

## 2016-08-05 NOTE — Progress Notes (Signed)
[facility]  PATIENT: Brittany Johnston DOB: 1986/03/20  REFERRING DOCTOR OR PCP:  Aura Camps [facility])    Fax [phone] SOURCE: Patient, , records from [facility], MRI and lab reports, MRI images on PACS.  _________________________________   HISTORICAL  CHIEF COMPLAINT:  Chief Complaint  Patient presents with  . Follow-up    discuss treatment options    HISTORY OF PRESENT ILLNESS:  Brittany Johnston is a 31 yo woman with a new diagnosis of MS.   Because of the aggressiveness of her MS (2 cervical spine lesions, one enhancing causing numbness and optic neuritis both occurring in rapid) she needs to be on a highly effective DMT.    Insurance did not approve Tysabri (I spoke to Dr. Luiz Blare, a medical director) and both the patient and I tried to initiate appeals last week  but the insurance company has delayed the process.  I have spoken with them for over an hour and two nurses with extensive experience working with MS patients have spent a combined total over 8 hours and the patient has spoken to them several times.    For the past 2 days, she has had right hand and leg numbness, consistent with a new exacerbation.   Her gait is more off balance.  No change in bladder.    MS History  Around 05/20/2016, she had the onset of left eye pain, worse with movement.   Symptoms started while driving and she noted looking to merge was painful. She saw her ophthalmologist and she had an MRI 06/03/2016 showing optic nerve enhancement on the left and a second focus in the right hemisphere that also enhanced.     She noted mild blurry vision shortly after that but it seemed worse 06/05/2016 and the next day, vision was worse.    She went to the [facility] ED and was admitted for 5 days of IV Solu-Medrol (3 days in hospital, 2 as outpatient).  There, she had an MRI of the cervical spine that showed 2 more lesions (at C4C5 and T1) consistent with MS.  In retrospect, a few weeks before  the visual symptoms, she noted right hand numbness, especially if she hold her phone which is persisting.     She saw Dr. Harlen Labs, Director of the MS Center at [facility].   Tysabri and ocrelizumab were discussed as possible treatments. Some initial blood work was performed and she is JCV antibody negative.   She is hepatitis B negative.  I have reviewed the MRI of the brain dated 06/03/2016 and the MRI of the cervical spine dated 06/08/2016.   The MRI of the brain showed enhancement of the left optic nerve. Additionally in the right temporal lobes there is another T2/FLAIR hyperintense focus that enhances. Additionally there is a periventricular right cerebellar focus that does not enhance. I also reviewed the MRI of the cervical spine. At C3-C4 there is a right lateral focus with subtle enhancement. There is also a right posterolateral focus adjacent to T1 that does not enhance.   Lab reports from her recent hospital stay were also evaluated. He is JCV antibody negative. Anti-NMO is negative.     REVIEW OF SYSTEMS: Constitutional: No fevers, chills, sweats, or change in appetite Eyes: No visual changes, double vision, eye pain Ear, nose and throat: No hearing loss, ear pain, nasal congestion, sore throat Cardiovascular: No chest pain, palpitations Respiratory: No shortness of breath at rest or with exertion.  No wheezes GastrointestinaI: No nausea, vomiting, diarrhea, abdominal pain, fecal incontinence,    Has constipation Genitourinary: No dysuria, urinary retention or frequency.  No nocturia.  Has had some yeast infections.  Musculoskeletal: No neck pain, back pain Integumentary: No rash, pruritus, skin lesions Neurological: as above Psychiatric: Noting some depression, worse with recent symptoms.   Endocrine: No palpitations, diaphoresis, change in appetite, change in weigh or increased thirst Hematologic/Lymphatic: No anemia, purpura, petechiae. Allergic/Immunologic: No  itchy/runny eyes, nasal congestion, recent allergic reactions, rashes  ALLERGIES: No Known Allergies  HOME MEDICATIONS:  Current Outpatient Prescriptions:  .  Cholecalciferol (VITAMIN D3) 5000 units CAPS, Take by mouth., Disp: , Rfl:  .  lactobacillus acidophilus (BACID) TABS tablet, Take 2 tablets by mouth 3 (three) times daily., Disp: , Rfl:  .  natalizumab (TYSABRI) 300 MG/15ML injection, Inject 300 mg into the vein every 30 (thirty) days., Disp: , Rfl:  .  UNABLE TO FIND, Methylcobalamin 1 mg daily, Disp: , Rfl:  .  zinc gluconate 50 MG tablet, Take 50 mg by mouth., Disp: , Rfl:   PAST MEDICAL HISTORY: Past Medical History:  Diagnosis Date  . Vision abnormalities     PAST SURGICAL HISTORY: Past Surgical History:  Procedure Laterality Date  . LEEP     suppose to have follow up of cervical cells in January    FAMILY HISTORY: Family History  Problem Relation Age of Onset  . Heart disease Mother   . Healthy Father     SOCIAL HISTORY:  Social History   Social History  . Marital status: Single    Spouse name: N/A  . Number of children: N/A  . Years of education: N/A   Occupational History  . Not on file.   Social History Main Topics  . Smoking status: Never Smoker  . Smokeless tobacco: Never Used  . Alcohol use Yes     Comment: occasional  . Drug use: No  . Sexual activity: Not on file   Other Topics Concern  . Not on file   Social History Narrative  . No narrative on file     PHYSICAL EXAM  Vitals:   08/05/16 1014  BP: 114/73  Pulse: 69  Resp: 20  Weight: 136 lb (61.7 kg)  Height: 5\' 5"  (1.651 m)    Body mass index is 22.63 kg/m.   General: The patient is well-developed and well-nourished and in no acute distress   Neurologic Exam  Mental status: The patient is alert and oriented x 3 at the time of the examination. The patient has apparent normal recent and remote memory, with an apparently normal attention span and concentration  ability.   Speech is normal.  Cranial nerves: Extraocular movements are full. She has a 2+ APD.   Left color vision is reduced.   Facial symmetry is present. There is good facial sensation to soft touch bilaterally.Facial strength is normal.  Trapezius and sternocleidomastoid strength is normal. No dysarthria is noted.  The tongue is midline, and the patient has symmetric elevation of the soft palate. No obvious hearing deficits are noted.  Motor:  Muscle bulk is normal.   Tone is normal. Strength is  5 / 5 in all 4 extremities.   Sensory: She has reduced sensation to touch in the right hand and right foot. Other modalities are more symmetric.  Coordination: Cerebellar testing reveals good finger-nose-finger and heel-to-shin bilaterally.  Gait and station: Station is normal.   Gait is normal. Tandem gait is now wide.  Romberg is negative.   Reflexes: Deep tendon reflexes are symmetric and normal bilaterally.       DIAGNOSTIC DATA (LABS, IMAGING, TESTING) - I reviewed patient records, labs, notes, testing and imaging myself where available.  Lab Results  Component Value Date   WBC 5.9 03/27/2016   HGB 12.6 03/27/2016   HCT 36.7 03/27/2016   MCV 86.2 03/27/2016   PLT 197 03/27/2016      Component Value Date/Time   NA 130 (L) 03/27/2016 2054   K 3.1 (L) 03/27/2016 2054   CL 100 (L) 03/27/2016 2054   CO2 21 (L) 03/27/2016 2054   GLUCOSE 112 (H) 03/27/2016 2054   BUN 7 03/27/2016 2054   CREATININE 0.61 03/27/2016 2054   CALCIUM 8.5 (L) 03/27/2016 2054   PROT 7.0 03/27/2016 2054   ALBUMIN 3.9 03/27/2016 2054   AST 24 03/27/2016 2054   ALT 17 03/27/2016 2054   ALKPHOS 55 03/27/2016 2054   BILITOT 0.6 03/27/2016 2054   GFRNONAA >60 03/27/2016 2054   GFRAA >60 03/27/2016 2054       ASSESSMENT AND PLAN  Multiple sclerosis (HCC)  Numbness  Optic neuritis  Transverse myelitis (HCC)    1.    She has a new MS exacerbation causing numbness on the right and a change in  her gait. We will do 3 days of IV Solu-Medrol one gram each day. 2.     I am continuing to try to get her on a more effective disease modifying therapy for her multiple sclerosis. An appeal has been initiated to have a peer-to-peer review by a neurologist who hopefully will recognize that her case presentation (spinal cord symptoms, multiple enhancing lesions, exacerbations occurring with short intervals and incomplete recovery) places her in a more aggressive subgroup.  I have extensive experience with multiple sclerosis following more than 500 patients and serving as Interior and spatial designer of the MS center in [place]. Additionally, she had an opinion from the director of the MS center at [facility] when hospitalized for her optic neuritis.   Both of [place] have felt that she needs to start on a highly effective DMT such as Tysabri (or ocrelizumab would also be a reasonable alternative).    Studies show that medications such as Copaxone or one of the interferons has only a 29-32% effect on relapse rate compared to the 68% effect that Tysabri has.   Additionally, another highly effective therapy (ocrelizumab) when compared to an interferon (Rebif) was more than 50% more effective.   Both Tysabri and Ocrelizumab are more effective in new lesion reduction on MRI, as well.  She is JCV Ab negative.    3.    It is medically necessary that she be placed on a highly effective disease modifying therapy for her aggressive form of MS. 4.   She will return tomorrow for her next dose of IV steroid and call if any new issues  47 minute face-to-face interaction with greater than one half the time counseling and coordinating care about her new diagnosis of MS and disease modifying therapy.  Merlon Alcorta A. Epimenio Foot, MD, PhD 08/05/2016, 12:32 PM Certified in Neurology, Clinical Neurophysiology, Sleep Medicine, Pain Medicine and Neuroimaging  [facility] [address] [place], [place] [address] [phone]

## 2016-08-05 NOTE — Telephone Encounter (Signed)
Spoke to [place] - she mentioned earlier today, while here for Solu-Medrol, that she has a Advice worker plan for her pharmacy benefits.  She had not realized that this information was unknown to [place] or important to her care.  The plan is completely separate from her [facility] plan.  I called [facility] at [phone] (spoke to [place]) and completed a verbal review for her Tysabri.  It was approved from 08/05/16 through 08/06/2018.  HW#[id number].  I called [facility] customer [facility] at [phone] (spoke to [place]) who stated the patient would have a zero co-pay.  I returned call to [place] to let her know the status.  She apologized profusely stating that she did not understand that this information was important for [place] to know.  She thought since the medication was being infused at the doctor's office that it would be covered by her medical insurance.  I assured her it was fine and that we were just glad to get the approval so she could start therapy.

## 2016-08-05 NOTE — Telephone Encounter (Addendum)
Late entry from 08/05/16 (prior to the two calls received from [facility] today):  1) [employer] and spoke to [place] - explained situation and transferred to member services. 2) Spoke to [place] in member services who stated the patient does not have prescription benefits with them.  He verified this with [facility] who is the [employer] pharmacy vendor.  Provided him with Tysabri's J-code [phone]).  He reviewed her benefits and stated this was billable through her medical policy as long as our office was buying and billing the medication.  He told me it required pre-certification and transferred me to the authorization department. 3) Spoke to Darl Pikes in the authorization department who stated there was already a case denied for this patient's Tysabri but she could not see if an appeal had been started yet.  She transferred to another area of authorization. 4) Spoke to Maralyn Sago in this other area of authorizations who told me she was unable to find a provider initiated appeal (although the appeal had been previously requested) but found a customer initiated appeal, via email request, that was started on 08/04/16.  She gave me the inquiry # of [id number]. Stated they needed a consent form from the patient.  The patient had already faxed and mailed (overnight) her consent form giving [place] permission to initiate an appeal on her behalf.  Also, the representative stated that there was documentation that [employer] called the patient and was unable to reach her.  She was unable to inform me of next step. I asked to speak with a supervisor.   The patient was in the office at the time of this call and stated she had no missed calls from [facility].  5) Spoke to supervisor, Jill Alexanders, who was able to locate both consent forms sent in by the patient.  He was also able to locate the provider initiated request for appeal and the customer initiated request for appeal.  He gave me the following information:  1) Provider Appeal was started on 08/03/16  and marked expedited (although Dr. Epimenio Foot had asked for the appeal on 07/26/16 - see previous  note) - Appeal #[id number].  It was closed on 08/04/16 because the appeal was not considered as expedited.  I asked him why the appeal  was closed when no one had reached out to Dr. Epimenio Foot to complete the appeal.  Jill Alexanders was uncertain what happened and said I would need  to speak with someone in member services.  2) Customer Appeal was started on 08/04/16 and was closed the same day - Appeal #[id number].  We were told that [employer] tried to reach  the patient unsuccessfully.  The patient was in our office at the time of this call.  She stated that she never received a call from the insurance  company on the date the call was claimed to be made.  Jill Alexanders was uncertain what happened and said I would need to speak with someone  in member services.   6) I was transferred to member services and spoke to [place] (call reference [id number]) 7) Additionally, during this time, a separate call was made to the Medical Director's office by Lupe Carney, RN.  She spoke to [place] who told her they had tried to reach our office by phone.  Our office has no documentation of any call from them.  In fact, we have been readily available and have made repeated phone calls to their offices.  Dr. Epimenio Foot also stated that he had not received a return call  from them. 8) I ask Heron Nay in member services to speak with a Merchandiser, retail.  He said he would research all the information and see what he could do to help.  After investigation, Heron Nay came back on the line and offered to have a Scientist, research (medical) named Orvilla Fus return the call to Dr. Epimenio Foot.  Dr. Bonnita Hollow mobile number was provided and I ask that the call be made from her prior to me disconnecting from member services.  He sent her an instant message and she was agreeable.  She phoned him immediately and took the patient's information from him at that time.  At this point, she started the appeal  process and ask him to fax in medical records to [phone].  She provided him with case [id number] and appeal #[id number] (which is the same appeal number that we were told was closed earlier today).  Dr. Epimenio Foot faxed the requested information and received confirmation of receipt.  After repeated communications with the insurance today and Dr. Bonnita Hollow faxed information, Ezequiel Essex has now returned our calls and indicated that a decision on this appeal will be made 08/09/16.  Dr. Epimenio Foot, Faith and I have spent a total of ten hours on the phone with the insurance company.  Today's call last 3 hours and 34 minutes.

## 2016-08-05 NOTE — Telephone Encounter (Signed)
Vernona Rieger with [facility] Dept. Is calling to discuss appeal for Tysabri for the patient. Her direct number is [phone].

## 2016-08-05 NOTE — Telephone Encounter (Signed)
I have spoken with Vernona Rieger in the appeals dept. at [facility] this afternoon.  She sts. pt's appeal has been assigned to her and we should have an answer by Monday 08-09-16/fim

## 2016-08-06 NOTE — Telephone Encounter (Signed)
Fax received from [facility].  Tysabri approved for dates 08-05-16 thru 08-06-18.  PA# [employer] [id number] DG/fim

## 2016-08-09 ENCOUNTER — Telehealth: Payer: Self-pay | Admitting: Neurology

## 2016-08-09 NOTE — Telephone Encounter (Signed)
Brittany Johnston from [facility] [employer]([phone]) called re: [id number]#[id number] to inform that a letter has been mailed out to pt that the claim was not approved

## 2016-08-09 NOTE — Telephone Encounter (Signed)
Noted.  RAS is aware. Note along with [facility] approval for Tysabri  left for Tina:  RAS would like pt. infused this week if at all possible/fim

## 2016-08-10 ENCOUNTER — Encounter: Payer: Self-pay | Admitting: Neurology

## 2016-08-11 NOTE — Telephone Encounter (Signed)
Patient calling saying she has not heard from [place] regarding her infusion. I called and spoke to [facility] and she was in the middle of a problem with a patient. I told patient I would give her VM for Mindy and she will call her right back but said she had rather leave a message with you.

## 2016-08-11 NOTE — Telephone Encounter (Signed)
Message printed and given to Tina in the infusion suite/fim 

## 2016-09-03 ENCOUNTER — Encounter: Payer: Self-pay | Admitting: Gastroenterology

## 2016-09-08 ENCOUNTER — Encounter: Payer: Self-pay | Admitting: Neurology

## 2016-09-08 ENCOUNTER — Telehealth: Payer: Self-pay | Admitting: *Deleted

## 2016-09-08 MED ORDER — PHENTERMINE HCL 30 MG PO CAPS: 30.0000 mg | ORAL_CAPSULE | ORAL | 5 refills | Status: DC

## 2016-09-08 NOTE — Telephone Encounter (Signed)
See 09/08/16 email/fim

## 2016-10-06 ENCOUNTER — Ambulatory Visit: Payer: BLUE CROSS/BLUE SHIELD | Admitting: Gastroenterology

## 2016-10-06 ENCOUNTER — Other Ambulatory Visit: Payer: Self-pay

## 2016-10-07 ENCOUNTER — Ambulatory Visit: Payer: BLUE CROSS/BLUE SHIELD | Admitting: Neurology

## 2016-10-14 ENCOUNTER — Encounter: Payer: Self-pay | Admitting: Neurology

## 2016-10-18 ENCOUNTER — Ambulatory Visit (INDEPENDENT_AMBULATORY_CARE_PROVIDER_SITE_OTHER): Payer: BLUE CROSS/BLUE SHIELD | Admitting: Neurology

## 2016-10-18 ENCOUNTER — Encounter: Payer: Self-pay | Admitting: Neurology

## 2016-10-18 ENCOUNTER — Telehealth: Payer: Self-pay | Admitting: *Deleted

## 2016-10-18 VITALS — BP 118/68 | HR 90 | Temp 100.7°F | Resp 16 | Ht 65.0 in | Wt 139.5 lb

## 2016-10-18 DIAGNOSIS — G501 Atypical facial pain: Secondary | ICD-10-CM | POA: Diagnosis not present

## 2016-10-18 DIAGNOSIS — H6690 Otitis media, unspecified, unspecified ear: Secondary | ICD-10-CM | POA: Insufficient documentation

## 2016-10-18 DIAGNOSIS — G35 Multiple sclerosis: Secondary | ICD-10-CM

## 2016-10-18 MED ORDER — METHYLPREDNISOLONE 4 MG PO TABS: ORAL_TABLET | ORAL | 0 refills | Status: DC

## 2016-10-18 MED ORDER — ANTIPYRINE-BENZOCAINE 5.4-1.4 % OT SOLN: 3.0000 [drp] | OTIC | 1 refills | Status: DC | PRN

## 2016-10-18 NOTE — Telephone Encounter (Signed)
I have spoken with Brittany Johnston this morning.  She sts. urgent care dx. ear infection vs TMJ problems.  She is not able to work today due to pain.  She will come in now for w/i with RAS/fim

## 2016-10-18 NOTE — Progress Notes (Signed)
[facility]  PATIENT: Brittany Johnston DOB: [birth year]  REFERRING DOCTOR OR PCP:  Aura Camps [facility])    Fax [phone]  _________________________________   HISTORICAL  CHIEF COMPLAINT:  Chief Complaint  Patient presents with  . Multiple Sclerosis    Sts. she continues to tolerate Tysabri well.  Today she c/o right sided facial pain--right ear, tmj area, onset yesterday morning upon waking.  "I could barely open my mouth."  Pain worse with opening mouth/fim  . Facial Pain    HISTORY OF PRESENT ILLNESS:  Brittany Johnston is a 31 yo woman with MS on Tysabri.   She is experiencing a lot of pain in and around the right ear.   Pain is constant in the ear and moderate with a throbbing quality and then She has superimposed electric-like shocks of pain radiating from the ear region to the cheek. Touching the ear is very painful.  Pain is worse if she opens her mouth.   Yesterday, she went to the [place] urgent care center. She was told she likely had swimmer's ear.   Although she hasn't done swimming lately, she often goes to bed with her hair wet.    She was prescribed an antibiotic eardrops and hydrocodone. She does not think she is any better today.  She grinds her teeth at night and notes some TMJ-like pain on the right if she opens her mouth wide that preceded the current symptoms.  MS:   She started Tysabri a few months ago and is tolerating well. She has had some twitching in the biceps and deltoid.   She has not experienced any other neurologic symptoms. She denies any new weakness,  numbness or change in gait.  No change in bladder function.  She denies any new difficulty with gait, strength or sensation.  Her mild gait difficulty has improved. She notes no changes in vision.  MS History  Around 05/20/2016, she had the onset of left eye pain, worse with movement.   Symptoms started while driving and she noted looking to merge was painful. She saw her  ophthalmologist and she had an MRI 06/03/2016 showing optic nerve enhancement on the left and a second focus in the right hemisphere that also enhanced.     She noted mild blurry vision shortly after that but it seemed worse 06/05/2016 and the next day, vision was worse.    She went to the [facility] ED and was admitted for 5 days of IV Solu-Medrol (3 days in hospital, 2 as outpatient).  There, she had an MRI of the cervical spine that showed 2 more lesions (at C4C5 and T1) consistent with MS.  In retrospect, a few weeks before the visual symptoms, she noted right hand numbness, especially if she hold her phone which is persisting.     I have reviewed the MRI of the brain dated 06/03/2016 and the MRI of the cervical spine dated 06/08/2016.   The MRI of the brain showed enhancement of the left optic nerve. Additionally in the right temporal lobes there is another T2/FLAIR hyperintense focus that enhances. Additionally there is a periventricular right cerebellar focus that does not enhance. I also reviewed the MRI of the cervical spine. At C3-C4 there is a right lateral focus with subtle enhancement. There is also a right posterolateral focus adjacent to T1 that does not enhance.   Lab reports from her recent hospital stay were also evaluated. He is JCV antibody negative. Anti-NMO is negative.  She  started Tysabri in March 2018.Marland Kitchen      REVIEW OF SYSTEMS: Constitutional: No fevers, chills, sweats, or change in appetite Eyes: No visual changes, double vision, eye pain Ear, nose and throat: No hearing loss, ear pain, nasal congestion, sore throat Cardiovascular: No chest pain, palpitations Respiratory: No shortness of breath at rest or with exertion.   No wheezes GastrointestinaI: No nausea, vomiting, diarrhea, abdominal pain, fecal incontinence,    Has constipation Genitourinary: No dysuria, urinary retention or frequency.  No nocturia.  Has had some yeast infections.  Musculoskeletal: No neck pain, back  pain Integumentary: No rash, pruritus, skin lesions Neurological: as above Psychiatric: Noting some depression, worse with recent symptoms.   Endocrine: No palpitations, diaphoresis, change in appetite, change in weigh or increased thirst Hematologic/Lymphatic: No anemia, purpura, petechiae. Allergic/Immunologic: No itchy/runny eyes, nasal congestion, recent allergic reactions, rashes  ALLERGIES: No Known Allergies  HOME MEDICATIONS:  Current Outpatient Prescriptions:  .  Cholecalciferol (VITAMIN D3) 5000 units CAPS, Take by mouth., Disp: , Rfl:  .  natalizumab (TYSABRI) 300 MG/15ML injection, Inject 300 mg into the vein every 30 (thirty) days., Disp: , Rfl:  .  phentermine 30 MG capsule, Take 1 capsule (30 mg total) by mouth every morning., Disp: 30 capsule, Rfl: 5 .  antipyrine-benzocaine (AURALGAN) OTIC solution, Place 3-4 drops into the right ear every 2 (two) hours as needed for ear pain., Disp: 20 mL, Rfl: 1 .  lactobacillus acidophilus (BACID) TABS tablet, Take 2 tablets by mouth 3 (three) times daily., Disp: , Rfl:  .  methylPREDNISolone (MEDROL) 4 MG tablet, Take as directed over 6 days, Disp: 21 tablet, Rfl: 0 .  UNABLE TO FIND, Methylcobalamin 1 mg daily, Disp: , Rfl:  .  zinc gluconate 50 MG tablet, Take 50 mg by mouth., Disp: , Rfl:   PAST MEDICAL HISTORY: Past Medical History:  Diagnosis Date  . Vision abnormalities     PAST SURGICAL HISTORY: Past Surgical History:  Procedure Laterality Date  . LEEP     suppose to have follow up of cervical cells in January    FAMILY HISTORY: Family History  Problem Relation Age of Onset  . Heart disease Mother   . Healthy Father     SOCIAL HISTORY:  Social History   Social History  . Marital status: Single    Spouse name: N/A  . Number of children: N/A  . Years of education: N/A   Occupational History  . Not on file.   Social History Main Topics  . Smoking status: Never Smoker  . Smokeless tobacco: Never Used   . Alcohol use Yes     Comment: occasional  . Drug use: No  . Sexual activity: Not on file   Other Topics Concern  . Not on file   Social History Narrative  . No narrative on file     PHYSICAL EXAM  118/68 Pulse:  90 RR:   16  Temp:  100.7 F (38.2 C)  Body mass index is 23.21 kg/m.   General: The patient is well-developed and well-nourished and in no acute distress   HEENT:   Head is normocephalic and atraumatic.   Touching the right there is painful. There is redness of the external ear canal. The tympanic membrane is red. I do not see any fluid behind the tympanic membrane.  Neurologic Exam  Mental status: The patient is alert and oriented x 3 at the time of the examination. The patient has apparent normal recent and remote memory,  with an apparently normal attention span and concentration ability.   Speech is normal.  Cranial nerves: Extraocular movements are full.  There is good facial sensation to soft touch bilaterally.Facial strength is normal.  Trapezius and sternocleidomastoid strength is normal. No dysarthria is noted.  The tongue is midline, and the patient has symmetric elevation of the soft palate. No obvious hearing deficits are noted.  Motor:  Muscle bulk is normal.   Tone is normal. Strength is  5 / 5 in all 4 extremities.   Sensory: She has normal and symmetric sensation to touch and vibration in the arms and legs.   Gait and station: Station is normal.   Gait is normal. Tandem gait is now wide.  Romberg is negative.   Reflexes: Deep tendon reflexes are symmetric and normal bilaterally.       DIAGNOSTIC DATA (LABS, IMAGING, TESTING) - I reviewed patient records, labs, notes, testing and imaging myself where available.  Lab Results  Component Value Date   WBC 5.9 03/27/2016   HGB 12.6 03/27/2016   HCT 36.7 03/27/2016   MCV 86.2 03/27/2016   PLT 197 03/27/2016      Component Value Date/Time   NA 130 (L) 03/27/2016 2054   K 3.1 (L) 03/27/2016  2054   CL 100 (L) 03/27/2016 2054   CO2 21 (L) 03/27/2016 2054   GLUCOSE 112 (H) 03/27/2016 2054   BUN 7 03/27/2016 2054   CREATININE 0.61 03/27/2016 2054   CALCIUM 8.5 (L) 03/27/2016 2054   PROT 7.0 03/27/2016 2054   ALBUMIN 3.9 03/27/2016 2054   AST 24 03/27/2016 2054   ALT 17 03/27/2016 2054   ALKPHOS 55 03/27/2016 2054   BILITOT 0.6 03/27/2016 2054   GFRNONAA >60 03/27/2016 2054   GFRAA >60 03/27/2016 2054       ASSESSMENT AND PLAN  Multiple sclerosis (HCC)  Ear infection  Atypical face pain    1.    She was diagnosed with otitis externa yesterday. Her exam does show redness in the external ear canal and eardrum. I could not appreciate fluid behind the tympanic membrane.   She has a low grade fever.    She also has some pain when she moves her jaw back-and-forth, only on the right and I can't rule out that she could have some component of TMJ, though the exquisite tenderness of the ear canal makes that less likely diagnosis. There is some shooting component with an electric-like quality worrisome for trigeminal neuralgia. I still believe that ear infection is most likely.  In the office, I instilled a few drops of 1% lidocaine.    I have added Auralgan to help with the pain.   If not better tomorrow, I will see if we get her into see ENT and I will add Augmentin for possible middle ear infection.    She'll take a steroid Dosepak to see if that can also help to reduce the inflammatory pain component.   Consider imaging if pain persists more than a few more days. 2.    For the MS, she will continue Tysabri. 3.    She will return as scheduled for her next Tysabri and follow-up visits. She should call sooner if she has new or worsening neurologic symptoms.  Richard A. Epimenio Foot, MD, PhD 10/18/2016, 4:45 PM Certified in Neurology, Clinical Neurophysiology, Sleep Medicine, Pain Medicine and Neuroimaging  [facility] [address], [address] [place], [place]  [address] [phone]

## 2016-10-19 ENCOUNTER — Telehealth: Payer: Self-pay | Admitting: Neurology

## 2016-10-19 ENCOUNTER — Encounter: Payer: Self-pay | Admitting: Neurology

## 2016-10-19 DIAGNOSIS — G501 Atypical facial pain: Secondary | ICD-10-CM

## 2016-10-19 DIAGNOSIS — H6690 Otitis media, unspecified, unspecified ear: Secondary | ICD-10-CM

## 2016-10-19 DIAGNOSIS — G35 Multiple sclerosis: Secondary | ICD-10-CM

## 2016-10-19 MED ORDER — AMOXICILLIN-POT CLAVULANATE 875-125 MG PO TABS
1.0000 | ORAL_TABLET | Freq: Two times a day (BID) | ORAL | 0 refills | Status: DC
Start: 2016-10-19 — End: 2016-11-16

## 2016-10-19 NOTE — Telephone Encounter (Signed)
Pt calling back to inform that due to her pain level she will look around on her own for a ENT specialist.  Pt will call back if she is unable to find anyone.

## 2016-10-19 NOTE — Telephone Encounter (Signed)
Pt is in excruciating pain and would like to be referred to an ENT other than Dr Haroldine Laws whom she has seen in the past. Pt sent an email regarding this also.

## 2016-10-19 NOTE — Telephone Encounter (Signed)
ENT referral made.  Augmentin escribed to [facility]/fim

## 2016-10-19 NOTE — Addendum Note (Signed)
Addended by: Candis Schatz I on: 10/19/2016 10:00 AM   Modules accepted: Orders

## 2016-11-16 ENCOUNTER — Encounter: Payer: Self-pay | Admitting: Neurology

## 2016-11-16 ENCOUNTER — Ambulatory Visit (INDEPENDENT_AMBULATORY_CARE_PROVIDER_SITE_OTHER): Payer: BLUE CROSS/BLUE SHIELD | Admitting: Neurology

## 2016-11-16 VITALS — BP 107/66 | HR 80 | Resp 18 | Ht 65.0 in | Wt 137.0 lb

## 2016-11-16 DIAGNOSIS — Z79899 Other long term (current) drug therapy: Secondary | ICD-10-CM | POA: Diagnosis not present

## 2016-11-16 DIAGNOSIS — H469 Unspecified optic neuritis: Secondary | ICD-10-CM

## 2016-11-16 DIAGNOSIS — G35 Multiple sclerosis: Secondary | ICD-10-CM

## 2016-11-16 DIAGNOSIS — G373 Acute transverse myelitis in demyelinating disease of central nervous system: Secondary | ICD-10-CM | POA: Diagnosis not present

## 2016-11-16 DIAGNOSIS — H6690 Otitis media, unspecified, unspecified ear: Secondary | ICD-10-CM

## 2016-11-16 NOTE — Progress Notes (Signed)
[facility]  PATIENT: Brittany Johnston DOB: 07-14-1985  REFERRING DOCTOR OR PCP:  Aura Camps [facility])    Fax [phone]  _________________________________   HISTORICAL  CHIEF COMPLAINT:  Chief Complaint  Patient presents with  . Multiple Sclerosis    Sts. she continues to tolerate Tysabri well.   JCV ab last checked at [facility] in Feb. 2018.  Denies new or worsening sx/fim    HISTORY OF PRESENT ILLNESS:  Brittany Johnston is a 31 yo woman with MS on Tysabri.   MS:   She started Tysabri in April 2018.   She is tolerating it well. She sometimes feels a little bit more tired the last week of each one month cycle. She was JCV antibody negative when tested in February. We will retest today.  Gait/strength/sensation: She denies any significant problem with her gait or strength. She did have some twitching in the right biceps and deltoid shortly after diagnosis and before starting Tysabri and some slight numbness in the right hand at the same time. The symptoms lasted about a week and have not returned.  Vision: She feels her vision is back to normal now.   She had left optic neuritis January 2018.  Bladder: She denies any difficulty with urinary frequency, urgency or hesitancy.  Mood/cognition:  She has not had any recent trouble with depression or anxiety. She had some mild depression and anxiety around the time of diagnosis but that has improved.   She denies any significant problems with cognitive function.  Fatigue/sleep:    She notes mild fatigue but it does not get in the way of her performing necessary activities of daily living. She sleeps well most nights.    She grinds her teeth at night and notes some TMJ-like pain on the right if she opens her mouth wide that preceded the current symptoms.   ENT:  She had severe otitis externa.   A combination of antibiotics and drops hekpled a lot.     MS History  Around 05/20/2016, she had the onset of left eye pain,  worse with movement.   Symptoms started while driving and she noted looking to merge was painful. She saw her ophthalmologist and she had an MRI 06/03/2016 showing optic nerve enhancement on the left and a second focus in the right hemisphere that also enhanced.     She noted mild blurry vision shortly after that but it seemed worse 06/05/2016 and the next day, vision was worse.    She went to the [facility] ED and was admitted for 5 days of IV Solu-Medrol (3 days in hospital, 2 as outpatient).  There, she had an MRI of the cervical spine that showed 2 more lesions (at C4C5 and T1) consistent with MS.  In retrospect, a few weeks before the visual symptoms, she noted right hand numbness, especially if she hold her phone which is persisting.     I have reviewed the MRI of the brain dated 06/03/2016 and the MRI of the cervical spine dated 06/08/2016.   The MRI of the brain showed enhancement of the left optic nerve. Additionally in the right temporal lobes there is another T2/FLAIR hyperintense focus that enhances. Additionally there is a periventricular right cerebellar focus that does not enhance. I also reviewed the MRI of the cervical spine. At C3-C4 there is a right lateral focus with subtle enhancement. There is also a right posterolateral focus adjacent to T1 that does not enhance.   Lab reports from  her recent hospital stay were also evaluated. He is JCV antibody negative. Anti-NMO is negative.  She started Tysabri in March 2018.Marland Kitchen      REVIEW OF SYSTEMS: Constitutional: No fevers, chills, sweats, or change in appetite Eyes: No visual changes, double vision, eye pain Ear, nose and throat: No hearing loss, ear pain, nasal congestion, sore throat Cardiovascular: No chest pain, palpitations Respiratory: No shortness of breath at rest or with exertion.   No wheezes GastrointestinaI: No nausea, vomiting, diarrhea, abdominal pain, fecal incontinence,    Has constipation Genitourinary: No dysuria, urinary retention or  frequency.  No nocturia.  Has had some yeast infections.  Musculoskeletal: No neck pain, back pain Integumentary: No rash, pruritus, skin lesions Neurological: as above Psychiatric: Currently denies depression,     Endocrine: No palpitations, diaphoresis, change in appetite, change in weigh or increased thirst Hematologic/Lymphatic: No anemia, purpura, petechiae. Allergic/Immunologic: No itchy/runny eyes, nasal congestion, recent allergic reactions, rashes  ALLERGIES: No Known Allergies  HOME MEDICATIONS:  Current Outpatient Prescriptions:  .  Cholecalciferol (VITAMIN D3) 5000 units CAPS, Take by mouth., Disp: , Rfl:  .  lactobacillus acidophilus (BACID) TABS tablet, Take 2 tablets by mouth 3 (three) times daily., Disp: , Rfl:  .  natalizumab (TYSABRI) 300 MG/15ML injection, Inject 300 mg into the vein every 30 (thirty) days., Disp: , Rfl:  .  phentermine 30 MG capsule, Take 1 capsule (30 mg total) by mouth every morning., Disp: 30 capsule, Rfl: 5 .  UNABLE TO FIND, Methylcobalamin 1 mg daily, Disp: , Rfl:  .  zinc gluconate 50 MG tablet, Take 50 mg by mouth., Disp: , Rfl:   PAST MEDICAL HISTORY: Past Medical History:  Diagnosis Date  . Vision abnormalities     PAST SURGICAL HISTORY: Past Surgical History:  Procedure Laterality Date  . LEEP     suppose to have follow up of cervical cells in January    FAMILY HISTORY: Family History  Problem Relation Age of Onset  . Heart disease Mother   . Healthy Father     SOCIAL HISTORY:  Social History   Social History  . Marital status: Single    Spouse name: N/A  . Number of children: N/A  . Years of education: N/A   Occupational History  . Not on file.   Social History Main Topics  . Smoking status: Never Smoker  . Smokeless tobacco: Never Used  . Alcohol use Yes     Comment: occasional  . Drug use: No  . Sexual activity: Not on file   Other Topics Concern  . Not on file   Social History Narrative  . No  narrative on file     PHYSICAL EXAM  118/68 Pulse:  90 RR:   16  Temp:  100.7 F (38.2 C)  Body mass index is 22.8 kg/m.   General: The patient is well-developed and well-nourished and in no acute distress   HEENT:   Head is normocephalic and atraumatic.  The ear is no longer tender  Neurologic Exam  Mental status: The patient is alert and oriented x 3 at the time of the examination. The patient has apparent normal recent and remote memory, with an apparently normal attention span and concentration ability.   Speech is normal.  Cranial nerves: Extraocular movements are full.  There is good facial sensation to soft touch bilaterally.Facial strength is normal.  Trapezius and sternocleidomastoid strength is normal. No dysarthria is noted.  The tongue is midline, and the patient has  symmetric elevation of the soft palate. No obvious hearing deficits are noted.  Motor:  Muscle bulk is normal.   Tone is normal. Strength is  5 / 5 in all 4 extremities.   Sensory: She symmetric sensation to touch and vibration in the arms and the legs.   Gait and station: Station is normal.   Gait is normal. Tandem gait is normal.  Romberg is negative.   Reflexes: Deep tendon reflexes are symmetric and normal bilaterally.       DIAGNOSTIC DATA (LABS, IMAGING, TESTING) - I reviewed patient records, labs, notes, testing and imaging myself where available.  Lab Results  Component Value Date   WBC 5.9 03/27/2016   HGB 12.6 03/27/2016   HCT 36.7 03/27/2016   MCV 86.2 03/27/2016   PLT 197 03/27/2016      Component Value Date/Time   NA 130 (L) 03/27/2016 2054   K 3.1 (L) 03/27/2016 2054   CL 100 (L) 03/27/2016 2054   CO2 21 (L) 03/27/2016 2054   GLUCOSE 112 (H) 03/27/2016 2054   BUN 7 03/27/2016 2054   CREATININE 0.61 03/27/2016 2054   CALCIUM 8.5 (L) 03/27/2016 2054   PROT 7.0 03/27/2016 2054   ALBUMIN 3.9 03/27/2016 2054   AST 24 03/27/2016 2054   ALT 17 03/27/2016 2054   ALKPHOS 55  03/27/2016 2054   BILITOT 0.6 03/27/2016 2054   GFRNONAA >60 03/27/2016 2054   GFRAA >60 03/27/2016 2054       ASSESSMENT AND PLAN  Multiple sclerosis (HCC)  Optic neuritis  Transverse myelitis (HCC)  High risk medication use  Ear infection    1.    For the MS, she will continue Tysabri.   I will check his JCV antibody today. Later this year or early next year we will check another MRI of the brain to make sure that she is not experiencing subclinical progression. 2.    Continue vitamin D. Continue phentermine for fatigue. 3.    She will return as scheduled for her next Tysabri and follow-up visits. She should call sooner if she has new or worsening neurologic symptoms.  Darlynn Ricco A. Epimenio Foot, MD, PhD 11/16/2016, 3:23 PM Certified in Neurology, Clinical Neurophysiology, Sleep Medicine, Pain Medicine and Neuroimaging  [facility] [address], [address] [place], [place] [address] [phone]

## 2016-11-17 LAB — CBC WITH DIFFERENTIAL/PLATELET
Basophils Absolute: 0 10*3/uL (ref 0.0–0.2)
Basos: 1 %
EOS (ABSOLUTE): 0.1 10*3/uL (ref 0.0–0.4)
EOS: 1 %
HEMATOCRIT: 34.5 % (ref 34.0–46.6)
Hemoglobin: 11.6 g/dL (ref 11.1–15.9)
IMMATURE GRANS (ABS): 0 10*3/uL (ref 0.0–0.1)
Immature Granulocytes: 0 %
LYMPHS: 49 %
Lymphocytes Absolute: 3.2 10*3/uL — ABNORMAL HIGH (ref 0.7–3.1)
MCH: 29.7 pg (ref 26.6–33.0)
MCHC: 33.6 g/dL (ref 31.5–35.7)
MCV: 89 fL (ref 79–97)
MONOS ABS: 0.4 10*3/uL (ref 0.1–0.9)
Monocytes: 7 %
NEUTROS PCT: 42 %
Neutrophils Absolute: 2.7 10*3/uL (ref 1.4–7.0)
PLATELETS: 225 10*3/uL (ref 150–379)
RBC: 3.9 x10E6/uL (ref 3.77–5.28)
RDW: 13.7 % (ref 12.3–15.4)
WBC: 6.4 10*3/uL (ref 3.4–10.8)

## 2016-11-22 ENCOUNTER — Encounter: Payer: Self-pay | Admitting: *Deleted

## 2017-01-05 ENCOUNTER — Encounter: Payer: Self-pay | Admitting: Neurology

## 2017-01-06 ENCOUNTER — Encounter: Payer: Self-pay | Admitting: Neurology

## 2017-01-06 ENCOUNTER — Ambulatory Visit (INDEPENDENT_AMBULATORY_CARE_PROVIDER_SITE_OTHER): Payer: BLUE CROSS/BLUE SHIELD | Admitting: Neurology

## 2017-01-06 VITALS — BP 118/77 | HR 76 | Resp 16 | Ht 65.0 in | Wt 137.5 lb

## 2017-01-06 DIAGNOSIS — F418 Other specified anxiety disorders: Secondary | ICD-10-CM | POA: Diagnosis not present

## 2017-01-06 DIAGNOSIS — H469 Unspecified optic neuritis: Secondary | ICD-10-CM

## 2017-01-06 DIAGNOSIS — Z79899 Other long term (current) drug therapy: Secondary | ICD-10-CM | POA: Diagnosis not present

## 2017-01-06 DIAGNOSIS — G35 Multiple sclerosis: Secondary | ICD-10-CM | POA: Diagnosis not present

## 2017-01-06 MED ORDER — SERTRALINE HCL 100 MG PO TABS: 100.0000 mg | ORAL_TABLET | Freq: Every day | ORAL | 11 refills | Status: DC

## 2017-01-06 NOTE — Telephone Encounter (Signed)
Spoke with Brittany Johnston this morning.  At last ov, she was not having problems with anx/dep.  She will come in now to discuss.  Added to schedule/fim

## 2017-01-06 NOTE — Progress Notes (Signed)
[facility]  PATIENT: Brittany Johnston DOB: 01/04/1986  REFERRING DOCTOR OR PCP:  Aura Camps [facility])    Fax [phone]  _________________________________   HISTORICAL  CHIEF COMPLAINT:  Chief Complaint  Patient presents with  . Multiple Sclerosis    Here today to discuss anxiety/depression, related to family/financial issues.  Denies SI/HI.  Tearful today./fim    HISTORY OF PRESENT ILLNESS:  Brittany Johnston is a 31 yo woman with MS on Tysabri.   Mood:   She is having a lot more depression and anxiety, triggered (she thinks) by other friends moving on with their life and her having all these health issues (besides MS needed cervical LEEP procedure and close monitoring and multiple moles removed-one possibly a melanoma).  She also has a lot of stress at work.     She has frequent crying spells.   We had a long discussion about her mood, triggers, relationship to medical issues therapy, etc   MS:   She continues on Tysabri. She tolerates it well. She is JCV antibody negative (last tested July 2018). She has not had any further exacerbations. Vision is essentially back to normal and her gait and sensation are practically baseline per   _____________________________________ From 11/16/2016  MS:   She started Tysabri in April 2018.   She is tolerating it well. She sometimes feels a little bit more tired the last week of each one month cycle. She was JCV antibody negative when tested in February. We will retest today.  Gait/strength/sensation: She denies any significant problem with her gait or strength. She did have some twitching in the right biceps and deltoid shortly after diagnosis and before starting Tysabri and some slight numbness in the right hand at the same time. The symptoms lasted about a week and have not returned.  Vision: She feels her vision is back to normal now.   She had left optic neuritis January 2018.  Bladder: She denies any difficulty  with urinary frequency, urgency or hesitancy.  Mood/cognition:  She has not had any recent trouble with depression or anxiety. She had some mild depression and anxiety around the time of diagnosis but that has improved.   She denies any significant problems with cognitive function.  Fatigue/sleep:    She notes mild fatigue but it does not get in the way of her performing necessary activities of daily living. She sleeps well most nights.    She grinds her teeth at night and notes some TMJ-like pain on the right if she opens her mouth wide that preceded the current symptoms.   ENT:  She had severe otitis externa.   A combination of antibiotics and drops hekpled a lot.     MS History  Around 05/20/2016, she had the onset of left eye pain, worse with movement.   Symptoms started while driving and she noted looking to merge was painful. She saw her ophthalmologist and she had an MRI 06/03/2016 showing optic nerve enhancement on the left and a second focus in the right hemisphere that also enhanced.     She noted mild blurry vision shortly after that but it seemed worse 06/05/2016 and the next day, vision was worse.    She went to the [facility] ED and was admitted for 5 days of IV Solu-Medrol (3 days in hospital, 2 as outpatient).  There, she had an MRI of the cervical spine that showed 2 more lesions (at C4C5 and T1) consistent with MS.  In retrospect,  a few weeks before the visual symptoms, she noted right hand numbness, especially if she hold her phone which is persisting.     I have reviewed the MRI of the brain dated 06/03/2016 and the MRI of the cervical spine dated 06/08/2016.   The MRI of the brain showed enhancement of the left optic nerve. Additionally in the right temporal lobes there is another T2/FLAIR hyperintense focus that enhances. Additionally there is a periventricular right cerebellar focus that does not enhance. I also reviewed the MRI of the cervical spine. At C3-C4 there is a right lateral focus with  subtle enhancement. There is also a right posterolateral focus adjacent to T1 that does not enhance.   Lab reports from her recent hospital stay were also evaluated. He is JCV antibody negative. Anti-NMO is negative.  She started Tysabri in March 2018.Marland Kitchen      REVIEW OF SYSTEMS: Constitutional: No fevers, chills, sweats, or change in appetite Eyes: No visual changes, double vision, eye pain Ear, nose and throat: No hearing loss, ear pain, nasal congestion, sore throat Cardiovascular: No chest pain, palpitations Respiratory: No shortness of breath at rest or with exertion.   No wheezes GastrointestinaI: No nausea, vomiting, diarrhea, abdominal pain, fecal incontinence,    Has constipation Genitourinary: No dysuria, urinary retention or frequency.  No nocturia.  Has had some yeast infections.  Musculoskeletal: No neck pain, back pain Integumentary: No rash, pruritus, skin lesions Neurological: as above Psychiatric: Currently denies depression,     Endocrine: No palpitations, diaphoresis, change in appetite, change in weigh or increased thirst Hematologic/Lymphatic: No anemia, purpura, petechiae. Allergic/Immunologic: No itchy/runny eyes, nasal congestion, recent allergic reactions, rashes  ALLERGIES: No Known Allergies  HOME MEDICATIONS:  Current Outpatient Prescriptions:  .  Cholecalciferol (VITAMIN D3) 5000 units CAPS, Take by mouth., Disp: , Rfl:  .  lactobacillus acidophilus (BACID) TABS tablet, Take 2 tablets by mouth 3 (three) times daily., Disp: , Rfl:  .  natalizumab (TYSABRI) 300 MG/15ML injection, Inject 300 mg into the vein every 30 (thirty) days., Disp: , Rfl:  .  UNABLE TO FIND, Methylcobalamin 1 mg daily, Disp: , Rfl:  .  zinc gluconate 50 MG tablet, Take 50 mg by mouth., Disp: , Rfl:  .  sertraline (ZOLOFT) 100 MG tablet, Take 1 tablet (100 mg total) by mouth daily., Disp: 30 tablet, Rfl: 11  PAST MEDICAL HISTORY: Past Medical History:  Diagnosis Date  . Vision  abnormalities     PAST SURGICAL HISTORY: Past Surgical History:  Procedure Laterality Date  . LEEP     suppose to have follow up of cervical cells in January    FAMILY HISTORY: Family History  Problem Relation Age of Onset  . Heart disease Mother   . Healthy Father     SOCIAL HISTORY:  Social History   Social History  . Marital status: Single    Spouse name: N/A  . Number of children: N/A  . Years of education: N/A   Occupational History  . Not on file.   Social History Main Topics  . Smoking status: Never Smoker  . Smokeless tobacco: Never Used  . Alcohol use Yes     Comment: occasional  . Drug use: No  . Sexual activity: Not on file   Other Topics Concern  . Not on file   Social History Narrative  . No narrative on file     PHYSICAL EXAM  BP 118/77   Pulse 76   Resp 16   Ht   (1.651 m)   Wt 137 lb 8 oz (62.4 kg)   BMI 22.88 kg/m    Body mass index is 22.88 kg/m.   General: The patient is well-developed and well-nourished and in no acute distress   HEENT:   Head is normocephalic and atraumatic.  The ear is no longer tender  Neurologic Exam  Mental status: She cried several times today. Affect is not flat. The patient is alert and oriented x 3 at the time of the examination. The patient has apparent normal recent and remote memory, with an apparently normal attention span and concentration ability.   Speech is normal.  Cranial nerves: Extraocular movements are full.  Facial strength and sensation is normal. Trapezius is normal.. No dysarthria is noted.  The tongue is midline, and the patient has symmetric elevation of the soft palate. No obvious hearing deficits are noted.  Motor:  Muscle bulk is normal.   Tone is normal. Strength is  5 / 5 in all 4 extremities.   Sensory: She symmetric sensation to touch in the arms and the legs.   Gait and station: Station is normal.   Gait is normal. Tandem gait is normal.    Reflexes: Deep tendon  reflexes are symmetric and normal bilaterally.       DIAGNOSTIC DATA (LABS, IMAGING, TESTING) - I reviewed patient records, labs, notes, testing and imaging myself where available.  Lab Results  Component Value Date   WBC 6.4 11/16/2016   HGB 11.6 11/16/2016   HCT 34.5 11/16/2016   MCV 89 11/16/2016   PLT 225 11/16/2016      Component Value Date/Time   NA 130 (L) 03/27/2016 2054   K 3.1 (L) 03/27/2016 2054   CL 100 (L) 03/27/2016 2054   CO2 21 (L) 03/27/2016 2054   GLUCOSE 112 (H) 03/27/2016 2054   BUN 7 03/27/2016 2054   CREATININE 0.61 03/27/2016 2054   CALCIUM 8.5 (L) 03/27/2016 2054   PROT 7.0 03/27/2016 2054   ALBUMIN 3.9 03/27/2016 2054   AST 24 03/27/2016 2054   ALT 17 03/27/2016 2054   ALKPHOS 55 03/27/2016 2054   BILITOT 0.6 03/27/2016 2054   GFRNONAA >60 03/27/2016 2054   GFRAA >60 03/27/2016 2054       ASSESSMENT AND PLAN  Multiple sclerosis (HCC)  Depression with anxiety  Optic neuritis  High risk medication use    1.    Sertraline 100 mg daily. We also discussed her seeing a psychiatrist or counselor. 2.    Continue vitamin D.   3.    Continue Tysabri every 4 weeks. She will return to see me in 4 months or sooner if there are new or worsening symptoms. She is advised to call us if mood is no better after 6 weeks or if she feels her mood is significantly worsening or if she feels she might harm herself or others.  Richard A. Epimenio Foot, MD, PhD 01/06/2017, 10:15 AM Certified in Neurology, Clinical Neurophysiology, Sleep Medicine, Pain Medicine and Neuroimaging  [facility] [address] [place], [place] [address] [phone]

## 2017-01-10 ENCOUNTER — Ambulatory Visit: Payer: Self-pay | Admitting: Neurology

## 2017-02-03 ENCOUNTER — Encounter: Payer: Self-pay | Admitting: *Deleted

## 2017-02-04 ENCOUNTER — Encounter: Payer: Self-pay | Admitting: Neurology

## 2017-05-08 IMAGING — US US TRANSVAGINAL NON-OB
1 series · 14 of 25 positions shown · non-contrast
Comparison: None

CLINICAL DATA: Acute onset of lower pelvic pain. Initial encounter.



[Series 1: us transvaginal non-ob · 0.20mm/px · 14 of 60 slices shown]
[im 1/60]
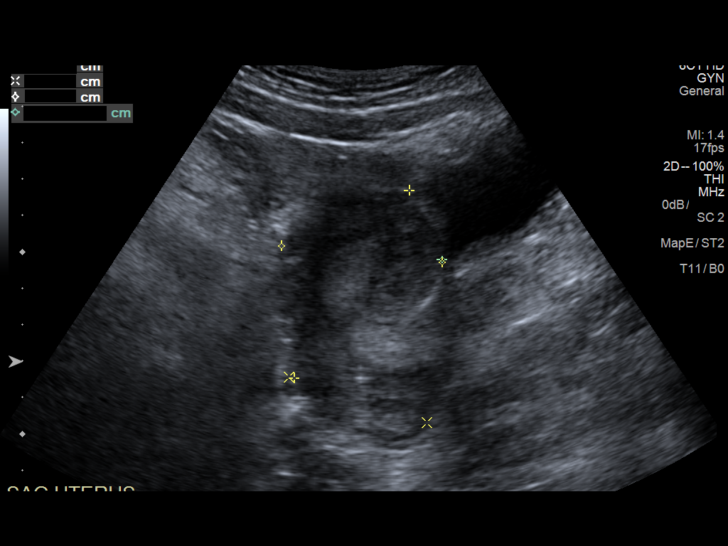
[im 5/60]
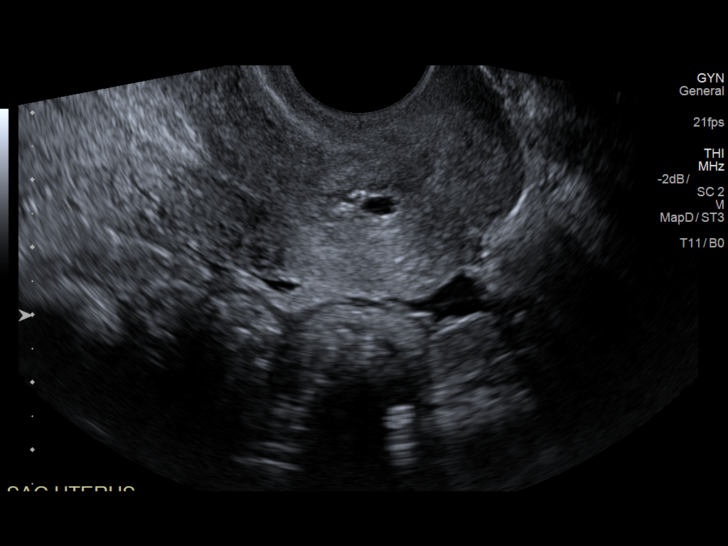
[im 10/60]
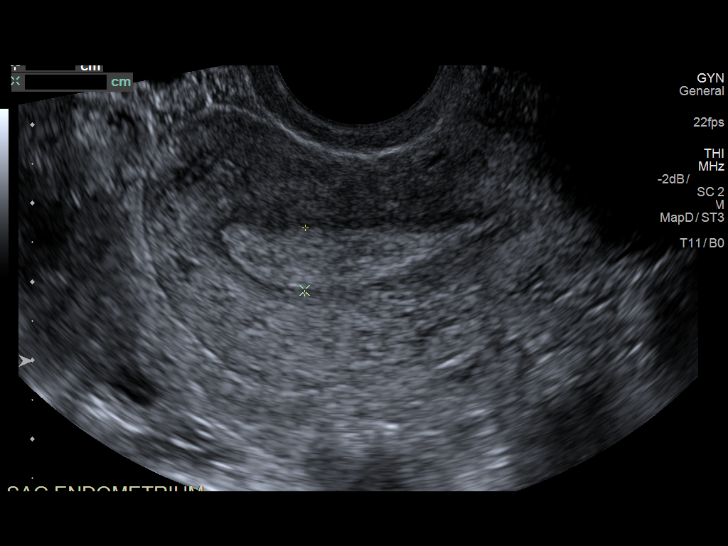
[im 15/60]
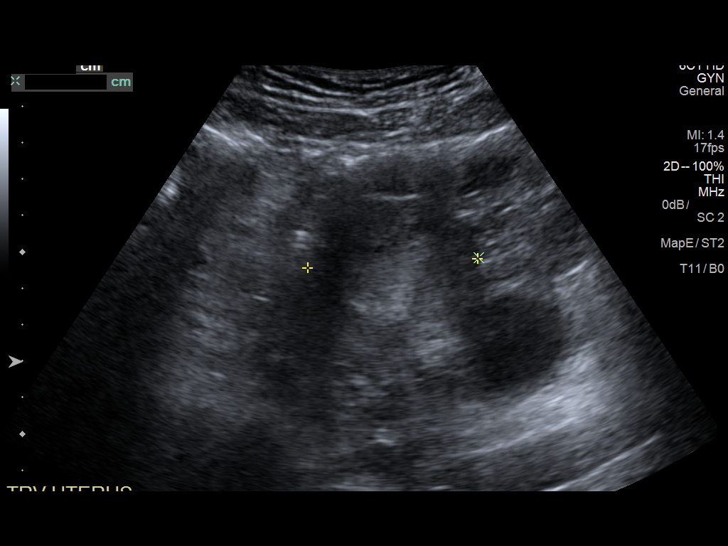
[im 20/60]
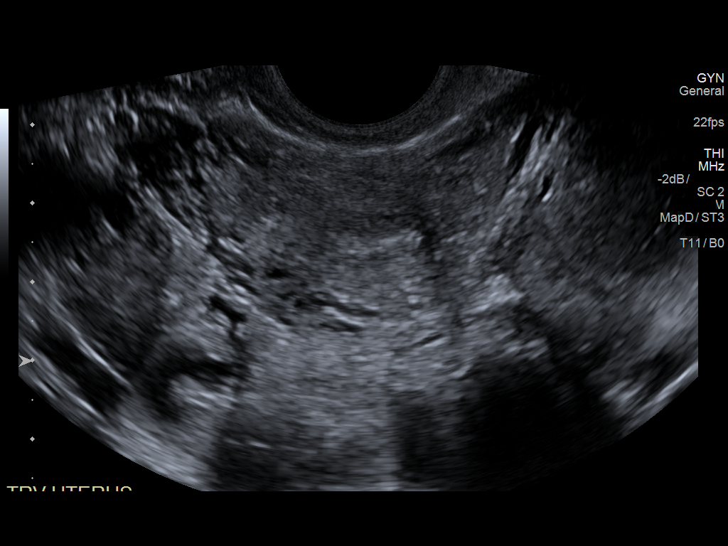
[im 23/60]
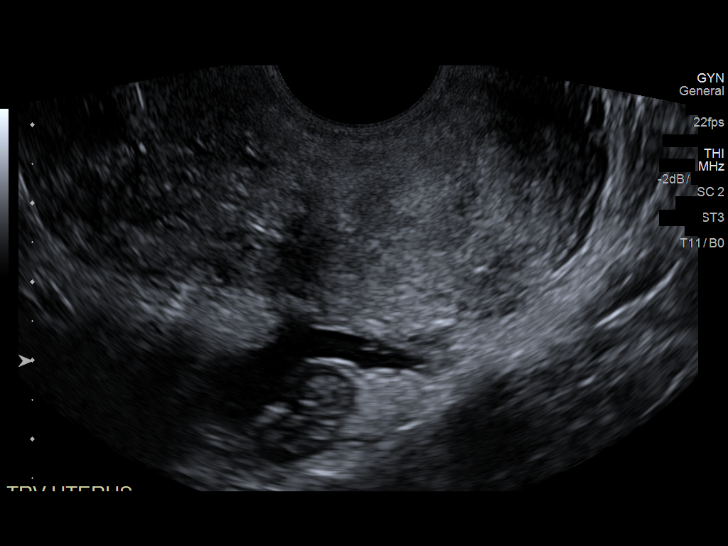
[im 28/60]
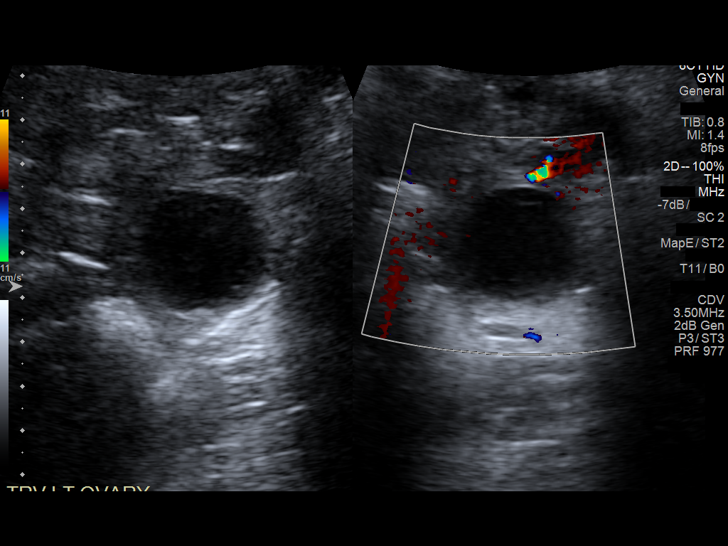
[im 32/60]
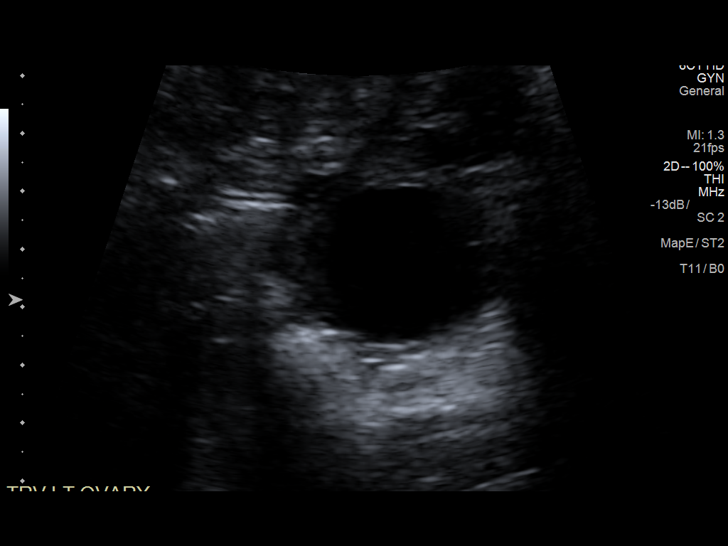
[im 37/60]
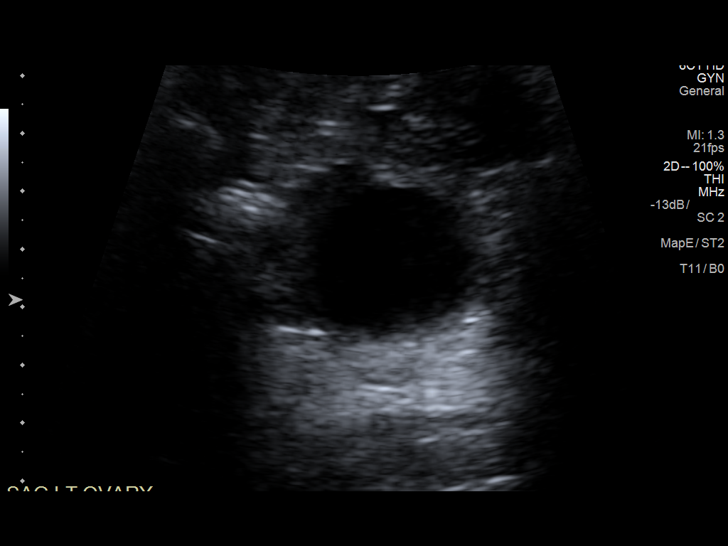
[im 40/60]
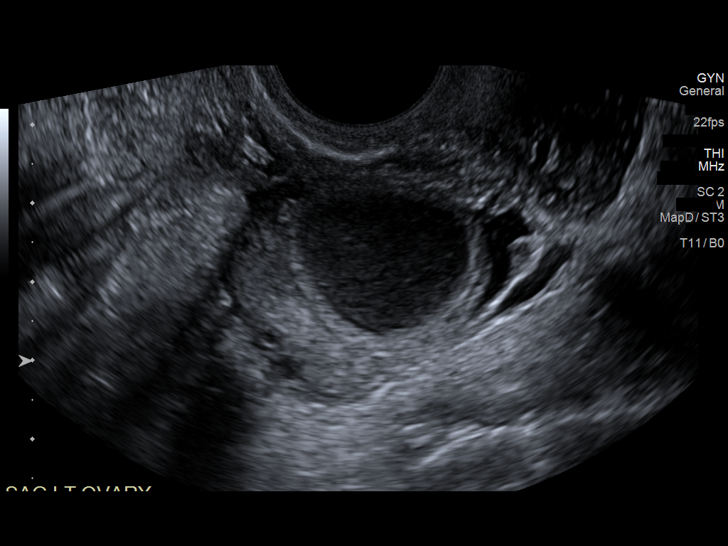
[im 45/60]
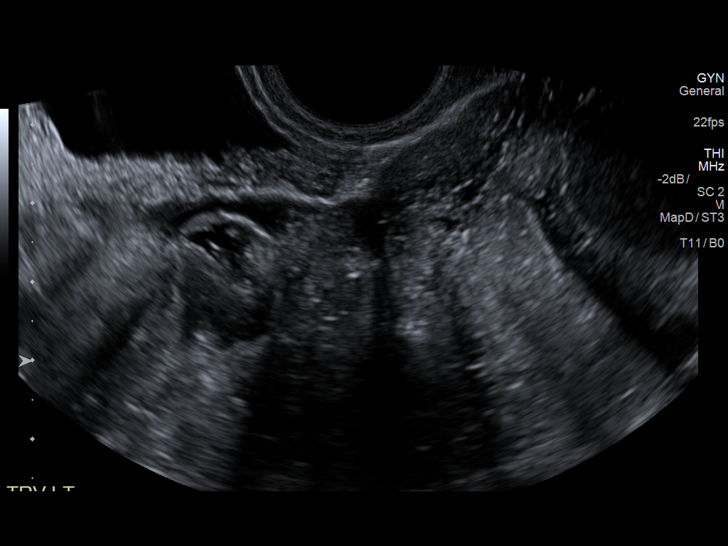
[im 50/60]
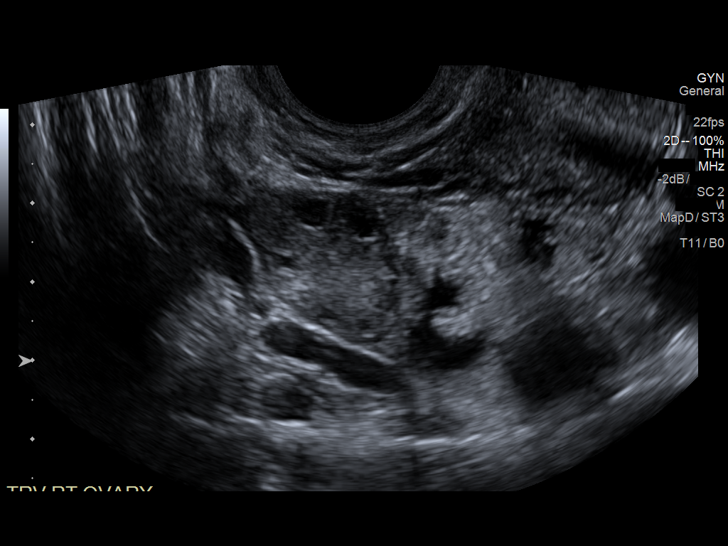
[im 55/60]
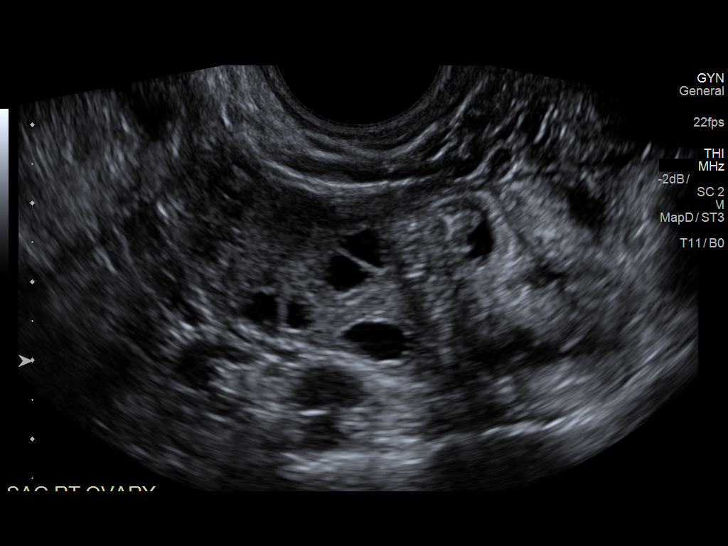
[im 60/60]
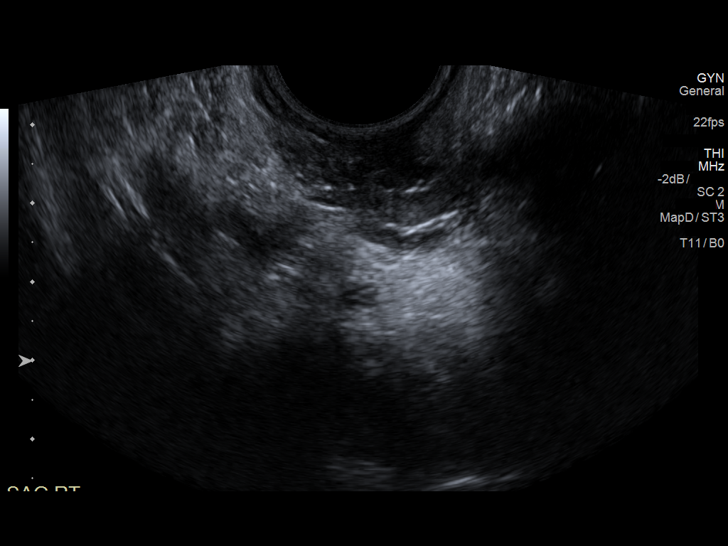

[14 of 25 positions shown; findings below may reference images not displayed]

FINDINGS: Uterus

Measurements: 7.3 x 3.6 x 5.7 cm. No fibroids or other mass
visualized.

Endometrium

Thickness: 0.8 cm.  No focal abnormality visualized.

Right ovary

Measurements: 3.8 x 1.4 x 2.3 cm. Normal appearance/no adnexal mass.

Left ovary

Measurements: 4.0 x 3.7 x 3.1 cm. A complex 3.1 x 3.1 x 2.2 cm cyst
is noted at the left ovary, with internal echoes. The appearance
suggests a hemorrhagic cyst.

Other findings

Trace free fluid is seen within the pelvic cul-de-sac.
IMPRESSION: 1. Suggestion of 3.1 cm hemorrhagic cyst at the left ovary.
2. Otherwise unremarkable pelvic ultrasound. No evidence for ovarian
torsion.

## 2017-05-19 ENCOUNTER — Encounter: Payer: Self-pay | Admitting: Neurology

## 2017-05-19 ENCOUNTER — Other Ambulatory Visit: Payer: Self-pay

## 2017-05-19 ENCOUNTER — Ambulatory Visit (INDEPENDENT_AMBULATORY_CARE_PROVIDER_SITE_OTHER): Payer: BLUE CROSS/BLUE SHIELD | Admitting: Neurology

## 2017-05-19 VITALS — BP 113/70 | HR 75 | Resp 16 | Wt 146.0 lb

## 2017-05-19 DIAGNOSIS — G35 Multiple sclerosis: Secondary | ICD-10-CM | POA: Diagnosis not present

## 2017-05-19 DIAGNOSIS — F418 Other specified anxiety disorders: Secondary | ICD-10-CM | POA: Diagnosis not present

## 2017-05-19 DIAGNOSIS — R200 Anesthesia of skin: Secondary | ICD-10-CM | POA: Diagnosis not present

## 2017-05-19 DIAGNOSIS — H469 Unspecified optic neuritis: Secondary | ICD-10-CM

## 2017-05-19 DIAGNOSIS — Z79899 Other long term (current) drug therapy: Secondary | ICD-10-CM | POA: Diagnosis not present

## 2017-05-19 MED ORDER — BUSPIRONE HCL 15 MG PO TABS: 15.0000 mg | ORAL_TABLET | Freq: Two times a day (BID) | ORAL | 11 refills | Status: DC

## 2017-05-19 NOTE — Progress Notes (Signed)
[facility]  PATIENT: Brittany Johnston DOB: 07-12-1985  REFERRING DOCTOR OR PCP:  Aura Camps [facility])    Fax [phone]  _________________________________   HISTORICAL  CHIEF COMPLAINT:  Chief Complaint  Patient presents with  . Multiple Sclerosis    Sts. she continues to tolerate Tysabri well.  JCV ab last checked 11/16/16 and was negative at 0.07.  Sts. she stopped Sertraline after 2 wks b/c she didn't feel it helped anxiety/depression.  She is willing to try it again.    HISTORY OF PRESENT ILLNESS:  Brittany Johnston is a 32 yo woman with MS on Tysabri.   Update 05/19/2017: She reports more stress and anxiety.   She tried Zoloft but sleep was worse so she stopped.   She has not tried Buspar.    We discussed some changes in medications and if not effective seeing a psychologist or psychiatrist may be of benefit.  Her MS is doing well on Tysabri.  She tolerates it well and has had no exacerbation.    She notes no problems with gait, strength, sensation or bladder.    Vision has returned to normal  She notes some fatigue most days, not much different with heat.     This does not affect her much at work but she is more tired after work and sometimes skips social events.   She sleeps well in general (was worse with Zoloft).  From 01/06/2017 Mood:   She is having a lot more depression and anxiety, triggered (she thinks) by other friends moving on with their life and her having all these health issues (besides MS needed cervical LEEP procedure and close monitoring and multiple moles removed-one possibly a melanoma).  She also has a lot of stress at work.     She has frequent crying spells.   We had a long discussion about her mood, triggers, relationship to medical issues therapy, etc   MS:   She continues on Tysabri. She tolerates it well. She is JCV antibody negative (last tested July 2018). She has not had any further exacerbations. Vision is essentially back to  normal and her gait and sensation are practically baseline per   _____________________________________ From 11/16/2016  MS:   She started Tysabri in April 2018.   She is tolerating it well. She sometimes feels a little bit more tired the last week of each one month cycle. She was JCV antibody negative when tested in February. We will retest today.  Gait/strength/sensation: She denies any significant problem with her gait or strength. She did have some twitching in the right biceps and deltoid shortly after diagnosis and before starting Tysabri and some slight numbness in the right hand at the same time. The symptoms lasted about a week and have not returned.  Vision: She feels her vision is back to normal now.   She had left optic neuritis January 2018.  Bladder: She denies any difficulty with urinary frequency, urgency or hesitancy.  Mood/cognition:  She has not had any recent trouble with depression or anxiety. She had some mild depression and anxiety around the time of diagnosis but that has improved.   She denies any significant problems with cognitive function.  Fatigue/sleep:    She notes mild fatigue but it does not get in the way of her performing necessary activities of daily living. She sleeps well most nights.    She grinds her teeth at night and notes some TMJ-like pain on the right if she opens  her mouth wide that preceded the current symptoms.   ENT:  She had severe otitis externa.   A combination of antibiotics and drops hekpled a lot.     MS History  Around 05/20/2016, she had the onset of left eye pain, worse with movement.   Symptoms started while driving and she noted looking to merge was painful. She saw her ophthalmologist and she had an MRI 06/03/2016 showing optic nerve enhancement on the left and a second focus in the right hemisphere that also enhanced.     She noted mild blurry vision shortly after that but it seemed worse 06/05/2016 and the next day, vision was worse.    She  went to the [facility] ED and was admitted for 5 days of IV Solu-Medrol (3 days in hospital, 2 as outpatient).  There, she had an MRI of the cervical spine that showed 2 more lesions (at C4C5 and T1) consistent with MS.  In retrospect, a few weeks before the visual symptoms, she noted right hand numbness, especially if she hold her phone which is persisting.     I have reviewed the MRI of the brain dated 06/03/2016 and the MRI of the cervical spine dated 06/08/2016.   The MRI of the brain showed enhancement of the left optic nerve. Additionally in the right temporal lobes there is another T2/FLAIR hyperintense focus that enhances. Additionally there is a periventricular right cerebellar focus that does not enhance. I also reviewed the MRI of the cervical spine. At C3-C4 there is a right lateral focus with subtle enhancement. There is also a right posterolateral focus adjacent to T1 that does not enhance.   Lab reports from her recent hospital stay were also evaluated. He is JCV antibody negative. Anti-NMO is negative.  She started Tysabri in March 2018.Marland Kitchen      REVIEW OF SYSTEMS: Constitutional: No fevers, chills, sweats, or change in appetite Eyes: No visual changes, double vision, eye pain Ear, nose and throat: No hearing loss, ear pain, nasal congestion, sore throat Cardiovascular: No chest pain, palpitations Respiratory: No shortness of breath at rest or with exertion.   No wheezes GastrointestinaI: No nausea, vomiting, diarrhea, abdominal pain, fecal incontinence,    Has constipation Genitourinary: No dysuria, urinary retention or frequency.  No nocturia.  Has had some yeast infections.  Musculoskeletal: No neck pain, back pain Integumentary: No rash, pruritus, skin lesions Neurological: as above Psychiatric: Currently denies depression,     Endocrine: No palpitations, diaphoresis, change in appetite, change in weigh or increased thirst Hematologic/Lymphatic: No anemia, purpura,  petechiae. Allergic/Immunologic: No itchy/runny eyes, nasal congestion, recent allergic reactions, rashes  ALLERGIES: No Known Allergies  HOME MEDICATIONS:  Current Outpatient Medications:  .  Cholecalciferol (VITAMIN D3) 5000 units CAPS, Take by mouth., Disp: , Rfl:  .  lactobacillus acidophilus (BACID) TABS tablet, Take 2 tablets by mouth 3 (three) times daily., Disp: , Rfl:  .  natalizumab (TYSABRI) 300 MG/15ML injection, Inject 300 mg into the vein every 30 (thirty) days., Disp: , Rfl:  .  UNABLE TO FIND, Methylcobalamin 1 mg daily, Disp: , Rfl:  .  zinc gluconate 50 MG tablet, Take 50 mg by mouth., Disp: , Rfl:  .  busPIRone (BUSPAR) 15 MG tablet, Take 1 tablet (15 mg total) by mouth 2 (two) times daily., Disp: 60 tablet, Rfl: 11 .  sertraline (ZOLOFT) 100 MG tablet, Take 1 tablet (100 mg total) by mouth daily. (Patient not taking: Reported on 05/19/2017), Disp: 30 tablet, Rfl: 11  PAST MEDICAL HISTORY: Past Medical History:  Diagnosis Date  . Vision abnormalities     PAST SURGICAL HISTORY: Past Surgical History:  Procedure Laterality Date  . LEEP     suppose to have follow up of cervical cells in January    FAMILY HISTORY: Family History  Problem Relation Age of Onset  . Heart disease Mother   . Healthy Father     SOCIAL HISTORY:  Social History   Socioeconomic History  . Marital status: Single    Spouse name: Not on file  . Number of children: Not on file  . Years of education: Not on file  . Highest education level: Not on file  Social Needs  . Financial resource strain: Not on file  . Food insecurity - worry: Not on file  . Food insecurity - inability: Not on file  . Transportation needs - medical: Not on file  . Transportation needs - non-medical: Not on file  Occupational History  . Not on file  Tobacco Use  . Smoking status: Never Smoker  . Smokeless tobacco: Never Used  Substance and Sexual Activity  . Alcohol use: Yes    Comment: occasional  .  Drug use: No  . Sexual activity: Not on file  Other Topics Concern  . Not on file  Social History Narrative  . Not on file     PHYSICAL EXAM  BP 113/70   Pulse 75   Resp 16   Wt 146 lb (66.2 kg)   BMI 24.30 kg/m    Body mass index is 24.3 kg/m.   General: The patient is well-developed and well-nourished and in no acute distress   HEENT:   Head is normocephalic and atraumatic.  The ear is no longer tender  Neurologic Exam  Mental status: She cried several times today. Affect is not flat. The patient is alert and oriented x 3 at the time of the examination. The patient has apparent normal recent and remote memory, with an apparently normal attention span and concentration ability.   Speech is normal.  Cranial nerves: Extraocular movements are full.  Facial strength and sensation is normal. Trapezius is normal.. No dysarthria is noted.  The tongue is midline, and the patient has symmetric elevation of the soft palate. No obvious hearing deficits are noted.  Motor:  Muscle bulk is normal.   Tone is normal. Strength is  5 / 5 in all 4 extremities.   Sensory: She symmetric sensation to touch in the arms and the legs.   Gait and station: Station is normal.   Gait is normal. Tandem gait is normal.    Reflexes: Deep tendon reflexes are symmetric and normal bilaterally.       DIAGNOSTIC DATA (LABS, IMAGING, TESTING) - I reviewed patient records, labs, notes, testing and imaging myself where available.  Lab Results  Component Value Date   WBC 6.4 11/16/2016   HGB 11.6 11/16/2016   HCT 34.5 11/16/2016   MCV 89 11/16/2016   PLT 225 11/16/2016      Component Value Date/Time   NA 130 (L) 03/27/2016 2054   K 3.1 (L) 03/27/2016 2054   CL 100 (L) 03/27/2016 2054   CO2 21 (L) 03/27/2016 2054   GLUCOSE 112 (H) 03/27/2016 2054   BUN 7 03/27/2016 2054   CREATININE 0.61 03/27/2016 2054   CALCIUM 8.5 (L) 03/27/2016 2054   PROT 7.0 03/27/2016 2054   ALBUMIN 3.9 03/27/2016 2054    AST 24 03/27/2016 2054  ALT 17 03/27/2016 2054   ALKPHOS 55 03/27/2016 2054   BILITOT 0.6 03/27/2016 2054   GFRNONAA >60 03/27/2016 2054   GFRAA >60 03/27/2016 2054       ASSESSMENT AND PLAN  Multiple sclerosis (HCC) - Plan: CBC with Differential/Platelet, Stratify JCV Antibody Test ([facility])  Optic neuritis  High risk medication use  Depression with anxiety  Numbness    1.    Buspar 15 mg po bid.  Consider Wellbutrin if anxiety better but depression persists.  We also discussed her seeing a psychiatrist or counselor.   2.    Continue vitamin D.   3.     Tysabri 300 mg every 4 weeks. We will recheck his JCV antibody today.  4.     Return in 4-6 months or sooner if there are new or worsening neurologic symptoms.  Brieanne Mignone A. Epimenio Foot, MD, PhD 05/19/2017, 1:32 PM Certified in Neurology, Clinical Neurophysiology, Sleep Medicine, Pain Medicine and Neuroimaging  [facility] [address] [place], [place] [address] [phone]

## 2017-05-20 LAB — CBC WITH DIFFERENTIAL/PLATELET
BASOS ABS: 0 10*3/uL (ref 0.0–0.2)
Basos: 1 %
EOS (ABSOLUTE): 0.1 10*3/uL (ref 0.0–0.4)
EOS: 1 %
HEMATOCRIT: 36.8 % (ref 34.0–46.6)
Hemoglobin: 12.6 g/dL (ref 11.1–15.9)
IMMATURE GRANULOCYTES: 0 %
Immature Grans (Abs): 0 10*3/uL (ref 0.0–0.1)
Lymphocytes Absolute: 4.1 10*3/uL — ABNORMAL HIGH (ref 0.7–3.1)
Lymphs: 49 %
MCH: 30.2 pg (ref 26.6–33.0)
MCHC: 34.2 g/dL (ref 31.5–35.7)
MCV: 88 fL (ref 79–97)
MONOS ABS: 0.7 10*3/uL (ref 0.1–0.9)
Monocytes: 8 %
NEUTROS PCT: 41 %
Neutrophils Absolute: 3.3 10*3/uL (ref 1.4–7.0)
PLATELETS: 219 10*3/uL (ref 150–379)
RBC: 4.17 x10E6/uL (ref 3.77–5.28)
RDW: 13.4 % (ref 12.3–15.4)
WBC: 8.2 10*3/uL (ref 3.4–10.8)

## 2017-05-24 ENCOUNTER — Telehealth: Payer: Self-pay | Admitting: *Deleted

## 2017-05-24 MED ORDER — BUSPIRONE HCL 30 MG PO TABS: ORAL_TABLET | ORAL | 1 refills | Status: DC

## 2017-05-24 NOTE — Telephone Encounter (Signed)
Received fax from pharmacy that Buspirone 15mg  is on back order.  No trouble getting 30mg , so new rx. for Buspirone 30mg , 1/2 tablet bid escribed to [facility]/fim

## 2017-05-30 ENCOUNTER — Telehealth: Payer: Self-pay

## 2017-05-30 NOTE — Telephone Encounter (Signed)
JCV lab negative, 0.07.

## 2017-06-09 ENCOUNTER — Telehealth: Payer: Self-pay | Admitting: *Deleted

## 2017-06-09 MED ORDER — BUSPIRONE HCL 30 MG PO TABS: ORAL_TABLET | ORAL | 1 refills | Status: DC

## 2017-06-09 NOTE — Telephone Encounter (Signed)
Buspar 30mg  escribed to [facility], as 15mg  is on back order/fim

## 2017-06-10 ENCOUNTER — Telehealth: Payer: Self-pay | Admitting: *Deleted

## 2017-06-10 MED ORDER — ARIPIPRAZOLE 5 MG PO TABS: 5.0000 mg | ORAL_TABLET | Freq: Every day | ORAL | 3 refills | Status: DC

## 2017-06-10 NOTE — Telephone Encounter (Signed)
Spoke with Brittany Johnston. She was taking Zoloft for anx/dep, but it caused insomnia, so she stopped.  Has rx. for Buspar but it is on back order (all doses). Per [facility], ok for Abilify 5mg  po qd.  Rx. escribed to [facility] [address] per her request/fim

## 2017-06-24 ENCOUNTER — Other Ambulatory Visit: Payer: Self-pay | Admitting: Neurology

## 2017-09-16 ENCOUNTER — Ambulatory Visit: Payer: BLUE CROSS/BLUE SHIELD | Admitting: Neurology

## 2017-09-19 ENCOUNTER — Telehealth: Payer: Self-pay

## 2017-09-19 ENCOUNTER — Telehealth: Payer: Self-pay | Admitting: Neurology

## 2017-09-19 ENCOUNTER — Ambulatory Visit: Payer: BLUE CROSS/BLUE SHIELD | Admitting: Neurology

## 2017-09-19 ENCOUNTER — Encounter: Payer: Self-pay | Admitting: Neurology

## 2017-09-19 VITALS — BP 105/64 | HR 70 | Ht 65.0 in | Wt 139.0 lb

## 2017-09-19 DIAGNOSIS — Z79899 Other long term (current) drug therapy: Secondary | ICD-10-CM

## 2017-09-19 DIAGNOSIS — R5383 Other fatigue: Secondary | ICD-10-CM | POA: Diagnosis not present

## 2017-09-19 DIAGNOSIS — H469 Unspecified optic neuritis: Secondary | ICD-10-CM | POA: Diagnosis not present

## 2017-09-19 DIAGNOSIS — G35 Multiple sclerosis: Secondary | ICD-10-CM | POA: Diagnosis not present

## 2017-09-19 DIAGNOSIS — Z3009 Encounter for other general counseling and advice on contraception: Secondary | ICD-10-CM | POA: Insufficient documentation

## 2017-09-19 DIAGNOSIS — G373 Acute transverse myelitis in demyelinating disease of central nervous system: Secondary | ICD-10-CM

## 2017-09-19 NOTE — Telephone Encounter (Signed)
[employer] Auth: [id number] (exp. 09/19/17 to 11/17/17)  lvm for pt to call back about scheduling mri

## 2017-09-19 NOTE — Telephone Encounter (Signed)
JCV specimen drawn today and placed in [facility] box for pick up. I made a copy of order and placed on Faith, RN desk.

## 2017-09-19 NOTE — Progress Notes (Signed)
[facility]  PATIENT: Brittany Johnston DOB: [birth year]  REFERRING DOCTOR OR PCP:  Aura Camps [facility])    Fax [phone]  _________________________________   HISTORICAL  CHIEF COMPLAINT:  Chief Complaint  Patient presents with  . Multiple Sclerosis    Patient doing well and would like to discuss future family planning.     HISTORY OF PRESENT ILLNESS:  Brittany Johnston is a 32 yo woman with MS on Tysabri.   Update 09/19/17: She is recently engaged and is interested in family planning issues.     She is currently on Tysabri, having started around March 2018.  She tolerates it well and has not had any relapses while she is on it.   Of note, Tysabri was chosen because she has a moderately aggressive MS with 2 spinal cord lesions including one that enhanced consistent with her numbness.  She also has multiple brain foci and optic neuritis.  She is engaged and planning on being married April 2020 and quickly starting a family after that.  We discussed several options.  One would be to continue on Tysabri until she gets pregnant and then stop it.  Another would be to stay on Tysabri and switch to Copaxone when she becomes pregnant.  The third option would be to do cladribine this year and next year and then she should be able to get pregnant around December 2020 or later.  A final option is to similarly do [place] this year and next year and then she should be able to get pregnant around September or October 2020 or later.  She feels that her MS symptoms are doing well.  She has no difficulty with her gait or balance.  She denies any weakness and rarely feels any numbness.  Region returned to normal.  She continues to note some anxiety and depression but is doing better.  She has some fatigue but this is not bothering her too much.  Update 05/19/2017: She reports more stress and anxiety.   She tried Zoloft but sleep was worse so she stopped.   She has not tried Buspar.     We discussed some changes in medications and if not effective seeing a psychologist or psychiatrist may be of benefit.  Her MS is doing well on Tysabri.  She tolerates it well and has had no exacerbation.    She notes no problems with gait, strength, sensation or bladder.    Vision has returned to normal  She notes some fatigue most days, not much different with heat.     This does not affect her much at work but she is more tired after work and sometimes skips social events.   She sleeps well in general (was worse with Zoloft).  From 01/06/2017 Mood:   She is having a lot more depression and anxiety, triggered (she thinks) by other friends moving on with their life and her having all these health issues (besides MS needed cervical LEEP procedure and close monitoring and multiple moles removed-one possibly a melanoma).  She also has a lot of stress at work.     She has frequent crying spells.   We had a long discussion about her mood, triggers, relationship to medical issues therapy, etc   MS:   She continues on Tysabri. She tolerates it well. She is JCV antibody negative (last tested July 2018). She has not had any further exacerbations. Vision is essentially back to normal and her gait and sensation are practically baseline  per   _____________________________________ From 11/16/2016  MS:   She started Tysabri in April 2018.   She is tolerating it well. She sometimes feels a little bit more tired the last week of each one month cycle. She was JCV antibody negative when tested in February. We will retest today.  Gait/strength/sensation: She denies any significant problem with her gait or strength. She did have some twitching in the right biceps and deltoid shortly after diagnosis and before starting Tysabri and some slight numbness in the right hand at the same time. The symptoms lasted about a week and have not returned.  Vision: She feels her vision is back to normal now.   She had left optic  neuritis January 2018.  Bladder: She denies any difficulty with urinary frequency, urgency or hesitancy.  Mood/cognition:  She has not had any recent trouble with depression or anxiety. She had some mild depression and anxiety around the time of diagnosis but that has improved.   She denies any significant problems with cognitive function.  Fatigue/sleep:    She notes mild fatigue but it does not get in the way of her performing necessary activities of daily living. She sleeps well most nights.    She grinds her teeth at night and notes some TMJ-like pain on the right if she opens her mouth wide that preceded the current symptoms.   ENT:  She had severe otitis externa.   A combination of antibiotics and drops hekpled a lot.     MS History  Around 05/20/2016, she had the onset of left eye pain, worse with movement.   Symptoms started while driving and she noted looking to merge was painful. She saw her ophthalmologist and she had an MRI 06/03/2016 showing optic nerve enhancement on the left and a second focus in the right hemisphere that also enhanced.     She noted mild blurry vision shortly after that but it seemed worse 06/05/2016 and the next day, vision was worse.    She went to [facility] ED and was admitted for 5 days of IV Solu-Medrol (3 days in hospital, 2 as outpatient).  There, she had an MRI of the cervical spine that showed 2 more lesions (at C4C5 and T1) consistent with MS.  In retrospect, a few weeks before the visual symptoms, she noted right hand numbness, especially if she hold her phone which is persisting.     I have reviewed the MRI of the brain dated 06/03/2016 and the MRI of the cervical spine dated 06/08/2016.   The MRI of the brain showed enhancement of the left optic nerve. Additionally in the right temporal lobes there is another T2/FLAIR hyperintense focus that enhances. Additionally there is a periventricular right cerebellar focus that does not enhance. I also reviewed the MRI of the  cervical spine. At C3-C4 there is a right lateral focus with subtle enhancement. There is also a right posterolateral focus adjacent to T1 that does not enhance.   Lab reports from her recent hospital stay were also evaluated. He is JCV antibody negative. Anti-NMO is negative.  She started Tysabri in March 2018.Marland Kitchen      REVIEW OF SYSTEMS: Constitutional: No fevers, chills, sweats, or change in appetite Eyes: No visual changes, double vision, eye pain Ear, nose and throat: No hearing loss, ear pain, nasal congestion, sore throat Cardiovascular: No chest pain, palpitations Respiratory: No shortness of breath at rest or with exertion.   No wheezes GastrointestinaI: No nausea, vomiting, diarrhea, abdominal  pain, fecal incontinence,    Has constipation Genitourinary: No dysuria, urinary retention or frequency.  No nocturia.  Has had some yeast infections.  Musculoskeletal: No neck pain, back pain Integumentary: No rash, pruritus, skin lesions Neurological: as above Psychiatric: Currently denies depression,     Endocrine: No palpitations, diaphoresis, change in appetite, change in weigh or increased thirst Hematologic/Lymphatic: No anemia, purpura, petechiae. Allergic/Immunologic: No itchy/runny eyes, nasal congestion, recent allergic reactions, rashes  ALLERGIES: No Known Allergies  HOME MEDICATIONS:  Current Outpatient Medications:  .  Cholecalciferol (VITAMIN D3) 5000 units CAPS, Take by mouth., Disp: , Rfl:  .  lactobacillus acidophilus (BACID) TABS tablet, Take 2 tablets by mouth 3 (three) times daily., Disp: , Rfl:  .  natalizumab (TYSABRI) 300 MG/15ML injection, Inject 300 mg into the vein every 30 (thirty) days., Disp: , Rfl:  .  UNABLE TO FIND, Methylcobalamin 1 mg daily, Disp: , Rfl:  .  zinc gluconate 50 MG tablet, Take 50 mg by mouth., Disp: , Rfl:   PAST MEDICAL HISTORY: Past Medical History:  Diagnosis Date  . Vision abnormalities     PAST SURGICAL HISTORY: Past  Surgical History:  Procedure Laterality Date  . LEEP     suppose to have follow up of cervical cells in January    FAMILY HISTORY: Family History  Problem Relation Age of Onset  . Heart disease Mother   . Healthy Father     SOCIAL HISTORY:  Social History   Socioeconomic History  . Marital status: Single    Spouse name: Not on file  . Number of children: Not on file  . Years of education: Not on file  . Highest education level: Not on file  Occupational History  . Not on file  Social Needs  . Financial resource strain: Not on file  . Food insecurity:    Worry: Not on file    Inability: Not on file  . Transportation needs:    Medical: Not on file    Non-medical: Not on file  Tobacco Use  . Smoking status: Never Smoker  . Smokeless tobacco: Never Used  Substance and Sexual Activity  . Alcohol use: Yes    Comment: occasional  . Drug use: No  . Sexual activity: Not on file  Lifestyle  . Physical activity:    Days per week: Not on file    Minutes per session: Not on file  . Stress: Not on file  Relationships  . Social connections:    Talks on phone: Not on file    Gets together: Not on file    Attends religious service: Not on file    Active member of club or organization: Not on file    Attends meetings of clubs or organizations: Not on file    Relationship status: Not on file  . Intimate partner violence:    Fear of current or ex partner: Not on file    Emotionally abused: Not on file    Physically abused: Not on file    Forced sexual activity: Not on file  Other Topics Concern  . Not on file  Social History Narrative  . Not on file     PHYSICAL EXAM  BP 105/64   Pulse 70   Ht 5\' 5"  (1.651 m)   Wt 139 lb (63 kg)   BMI 23.13 kg/m    Body mass index is 23.13 kg/m.   General: The patient is well-developed and well-nourished and in no acute distress  HEENT:   Head is normocephalic and atraumatic.  The ear is no longer tender  Neurologic  Exam  Mental status: She cried several times today. Affect is not flat. The patient is alert and oriented x 3 at the time of the examination. The patient has apparent normal recent and remote memory, with an apparently normal attention span and concentration ability.   Speech is normal.  Cranial nerves: Extraocular movements are full.  Facial strength and sensation is normal.  Trapezius strength is normal.  The tongue is midline, and the patient has symmetric elevation of the soft palate. No obvious hearing deficits are noted.  Motor:  Muscle bulk is normal.   Tone is normal. Strength is  5 / 5 in all 4 extremities.   Sensory: She symmetric sensation to touch in the arms and the legs.   Gait and station: Station is normal.   Gait and the tandem gait are normal.  Reflexes: Deep tendon reflexes are symmetric and normal bilaterally.       DIAGNOSTIC DATA (LABS, IMAGING, TESTING) - I reviewed patient records, labs, notes, testing and imaging myself where available.  Lab Results  Component Value Date   WBC 8.2 05/19/2017   HGB 12.6 05/19/2017   HCT 36.8 05/19/2017   MCV 88 05/19/2017   PLT 219 05/19/2017      Component Value Date/Time   NA 130 (L) 03/27/2016 2054   K 3.1 (L) 03/27/2016 2054   CL 100 (L) 03/27/2016 2054   CO2 21 (L) 03/27/2016 2054   GLUCOSE 112 (H) 03/27/2016 2054   BUN 7 03/27/2016 2054   CREATININE 0.61 03/27/2016 2054   CALCIUM 8.5 (L) 03/27/2016 2054   PROT 7.0 03/27/2016 2054   ALBUMIN 3.9 03/27/2016 2054   AST 24 03/27/2016 2054   ALT 17 03/27/2016 2054   ALKPHOS 55 03/27/2016 2054   BILITOT 0.6 03/27/2016 2054   GFRNONAA >60 03/27/2016 2054   GFRAA >60 03/27/2016 2054       ASSESSMENT AND PLAN  Multiple sclerosis (HCC) - Plan: Varicella zoster antibody, IgG, CBC with Differential/Platelet, Comprehensive metabolic panel, Stratify JCV Antibody Test ([facility]), MR BRAIN W WO CONTRAST, MR CERVICAL SPINE W WO CONTRAST, Hepatitis B core antibody, total,  Hepatitis B surface antigen, HIV antibody, QuantiFERON-TB Gold Plus, Hepatitis B surface antibody, Hepatitis C antibody  Transverse myelitis (HCC)  High risk medication use - Plan: Varicella zoster antibody, IgG, CBC with Differential/Platelet, Comprehensive metabolic panel, Stratify JCV Antibody Test ([facility]), Hepatitis B core antibody, total, Hepatitis B surface antigen, HIV antibody, QuantiFERON-TB Gold Plus, Hepatitis B surface antibody, Hepatitis C antibody  Optic neuritis  Family planning counseling  Other fatigue    1.    We discussed the various options for family planning issues (see above).    She will decide in the next week or 2 where they are to go on a long-lasting medication such as Lemtrada or cladribine (leaning towards cladribine) or to stay on Tysabri until she gets pregnant.  We will go ahead and check blood work for opportunistic infections to make sure that she is a candidate.  Additionally I will check a varicella zoster IgG to see if she is immune.  If not I will recommend that she get back seen before starting medications. 2.    Continue other med's   3.     Return in 3 months or sooner if there are new or worsening neurologic symptoms.   If she does decide to go on cladribine  we will need to check a lymphocyte count around that time.  45-minute face-to-face evaluation with greater than one half the time counseling and coordinating care about family planning issues in regards to her MS and disease modifying therapies  Tenicia Gural A. Epimenio Foot, MD, PhD 09/19/2017, 12:51 PM Certified in Neurology, Clinical Neurophysiology, Sleep Medicine, Pain Medicine and Neuroimaging  [facility] [address] [place], [place] [address] [phone]

## 2017-09-19 NOTE — Telephone Encounter (Signed)
Patient returned my call she is scheduled for 09/21/17 on the [facility] mobile unit.

## 2017-09-20 ENCOUNTER — Telehealth: Payer: Self-pay | Admitting: *Deleted

## 2017-09-20 NOTE — Telephone Encounter (Signed)
LMOM with below lab results and rec. for the Shingrix vaccination.  Please call if she has any questions/fim

## 2017-09-20 NOTE — Telephone Encounter (Signed)
-----   Message from Asa Lente, MD sent at 09/20/2017  8:23 AM EDT ----- Please let her know the labwork shows she does not have immunity against varicella (Chicken Pox / Shingles).  She should get the vaccine (the best vaccine for patients with autoimmune diseases is [facility]. And the schedule is two shots 2 months apart.

## 2017-09-21 ENCOUNTER — Ambulatory Visit: Payer: BLUE CROSS/BLUE SHIELD

## 2017-09-21 DIAGNOSIS — G35 Multiple sclerosis: Secondary | ICD-10-CM | POA: Diagnosis not present

## 2017-09-21 MED ORDER — GADOPENTETATE DIMEGLUMINE 469.01 MG/ML IV SOLN
13.0000 mL | Freq: Once | INTRAVENOUS | Status: AC | PRN
  Administered 2017-09-21: 13 mL via INTRAVENOUS

## 2017-09-22 ENCOUNTER — Telehealth: Payer: Self-pay | Admitting: *Deleted

## 2017-09-22 NOTE — Telephone Encounter (Signed)
-----   Message from Asa Lente, MD sent at 09/21/2017  6:05 PM EDT ----- Please let her know that the MRI of the brain did not show any new lesions in the one focus that was brand-new on the February 2018 MRI is much smaller and no longer enhances.  The MRI of the cervical spine did not show any new lesions.  2 of the 3 small foci actually look smaller on the current scan that they did in 2018.

## 2017-09-22 NOTE — Telephone Encounter (Signed)
Spoke with Nicholaus Bloom.  We reviewed MRI results.  She verbalized understanding of same. Sts. she is still considering her tx. options for when she becomes pregnant, but right now she is leaning towards continuing Tysabri until she gets pregnant, then switching to Copaxone/fim

## 2017-09-22 NOTE — Telephone Encounter (Signed)
LMOM with below MRI report.  She does not need to return this call unless she has questions/fim 

## 2017-09-23 LAB — CBC WITH DIFFERENTIAL/PLATELET
BASOS: 0 %
Basophils Absolute: 0 10*3/uL (ref 0.0–0.2)
EOS (ABSOLUTE): 0.2 10*3/uL (ref 0.0–0.4)
EOS: 2 %
HEMATOCRIT: 38.2 % (ref 34.0–46.6)
Hemoglobin: 12.5 g/dL (ref 11.1–15.9)
Immature Grans (Abs): 0 10*3/uL (ref 0.0–0.1)
Immature Granulocytes: 0 %
LYMPHS ABS: 3.9 10*3/uL — AB (ref 0.7–3.1)
Lymphs: 40 %
MCH: 29.6 pg (ref 26.6–33.0)
MCHC: 32.7 g/dL (ref 31.5–35.7)
MCV: 91 fL (ref 79–97)
MONOS ABS: 0.8 10*3/uL (ref 0.1–0.9)
Monocytes: 8 %
NEUTROS ABS: 4.9 10*3/uL (ref 1.4–7.0)
Neutrophils: 50 %
PLATELETS: 246 10*3/uL (ref 150–450)
RBC: 4.22 x10E6/uL (ref 3.77–5.28)
RDW: 14 % (ref 12.3–15.4)
WBC: 9.7 10*3/uL (ref 3.4–10.8)

## 2017-09-23 LAB — QUANTIFERON-TB GOLD PLUS
QuantiFERON Mitogen Value: >10 IU/mL
QuantiFERON Nil Value: 0.02 IU/mL
QuantiFERON TB1 Ag Value: 0.02 IU/mL
QuantiFERON TB2 Ag Value: 0.03 IU/mL
QuantiFERON-TB Gold Plus: NEGATIVE

## 2017-09-23 LAB — COMPREHENSIVE METABOLIC PANEL
A/G RATIO: 2 (ref 1.2–2.2)
ALT: 13 IU/L (ref 0–32)
AST: 15 IU/L (ref 0–40)
Albumin: 4.4 g/dL (ref 3.5–5.5)
Alkaline Phosphatase: 67 IU/L (ref 39–117)
BILIRUBIN TOTAL: 0.3 mg/dL (ref 0.0–1.2)
BUN/Creatinine Ratio: 16 (ref 9–23)
BUN: 9 mg/dL (ref 6–20)
CALCIUM: 9.2 mg/dL (ref 8.7–10.2)
CHLORIDE: 103 mmol/L (ref 96–106)
CO2: 24 mmol/L (ref 20–29)
Creatinine, Ser: 0.57 mg/dL (ref 0.57–1.00)
GFR, EST AFRICAN AMERICAN: 144 mL/min/{1.73_m2} (ref >59)
GFR, EST NON AFRICAN AMERICAN: 125 mL/min/{1.73_m2} (ref >59)
GLUCOSE: 80 mg/dL (ref 65–99)
Globulin, Total: 2.2 g/dL (ref 1.5–4.5)
POTASSIUM: 4.5 mmol/L (ref 3.5–5.2)
Sodium: 141 mmol/L (ref 134–144)
TOTAL PROTEIN: 6.6 g/dL (ref 6.0–8.5)

## 2017-09-23 LAB — HEPATITIS B CORE ANTIBODY, TOTAL: Hep B Core Total Ab: NEGATIVE

## 2017-09-23 LAB — HIV ANTIBODY (ROUTINE TESTING W REFLEX): HIV SCREEN 4TH GENERATION: NONREACTIVE

## 2017-09-23 LAB — HEPATITIS C ANTIBODY

## 2017-09-23 LAB — HEPATITIS B SURFACE ANTIBODY,QUALITATIVE: Hep B Surface Ab, Qual: NONREACTIVE

## 2017-09-23 LAB — HEPATITIS B SURFACE ANTIGEN: HEP B S AG: NEGATIVE

## 2017-09-23 LAB — VARICELLA ZOSTER ANTIBODY, IGG: Varicella zoster IgG: <135 index — ABNORMAL LOW (ref >165)

## 2017-09-27 ENCOUNTER — Encounter: Payer: Self-pay | Admitting: *Deleted

## 2017-11-28 ENCOUNTER — Encounter: Payer: Self-pay | Admitting: Neurology

## 2017-12-07 ENCOUNTER — Encounter: Payer: Self-pay | Admitting: Neurology

## 2017-12-28 ENCOUNTER — Other Ambulatory Visit: Payer: Self-pay

## 2017-12-28 ENCOUNTER — Ambulatory Visit: Payer: BLUE CROSS/BLUE SHIELD | Admitting: Neurology

## 2017-12-28 ENCOUNTER — Encounter: Payer: Self-pay | Admitting: Neurology

## 2017-12-28 VITALS — BP 117/76 | HR 87 | Resp 16 | Ht 65.0 in | Wt 142.0 lb

## 2017-12-28 DIAGNOSIS — R200 Anesthesia of skin: Secondary | ICD-10-CM

## 2017-12-28 DIAGNOSIS — E559 Vitamin D deficiency, unspecified: Secondary | ICD-10-CM

## 2017-12-28 DIAGNOSIS — F988 Other specified behavioral and emotional disorders with onset usually occurring in childhood and adolescence: Secondary | ICD-10-CM

## 2017-12-28 DIAGNOSIS — H469 Unspecified optic neuritis: Secondary | ICD-10-CM

## 2017-12-28 DIAGNOSIS — R5383 Other fatigue: Secondary | ICD-10-CM

## 2017-12-28 DIAGNOSIS — G373 Acute transverse myelitis in demyelinating disease of central nervous system: Secondary | ICD-10-CM

## 2017-12-28 DIAGNOSIS — G35 Multiple sclerosis: Secondary | ICD-10-CM | POA: Diagnosis not present

## 2017-12-28 HISTORY — DX: Other specified behavioral and emotional disorders with onset usually occurring in childhood and adolescence: F98.8

## 2017-12-28 MED ORDER — AMPHETAMINE-DEXTROAMPHET ER 15 MG PO CP24: 15.0000 mg | ORAL_CAPSULE | ORAL | 0 refills | Status: DC

## 2017-12-28 NOTE — Progress Notes (Signed)
[facility]  PATIENT: Brittany Johnston DOB: January 15, 1986  REFERRING DOCTOR OR PCP:  [facility])    Fax [phone]  _________________________________   HISTORICAL  CHIEF COMPLAINT:  Chief Complaint  Patient presents with  . Multiple Sclerosis    Sts. she continues to tolerate Tysabri well.  JCV ab last checked 09/19/17 was Negative at 0.08. Sts. she is having more trouble with focus and concentration, remembering things at work.  Sts. she was at the beach last wk, got overheated and began haivng numbness/tingling in hands, pain in the balls of her feet.  All sx.  have resolved with exception of some burning pian in feet, but this is improving/fim    HISTORY OF PRESENT ILLNESS:  Brittany Johnston is a 32 y.o. woman with MS on Tysabri.    Update 12/28/2017:  She is on Tysabri  She was at the beach for several days.   She had the onset of tingling int eh bottom of the feet that becomes hot when she lays down.     It does not go past the ankles.   She deneis weakness.     She does feel more tired but not sleey.      She gets enough sleep at night.     She feels her mood is fine.  She cries when the feet are hurting more but she notes she is more emotional.   She feels her focus and attention are decreased and she has been forgetful at work.   She was diagnosed with ADD as a teenager and took Adderall in middle and high school.      Update 09/19/17: She is recently engaged and is interested in family planning issues.     She is currently on Tysabri, having started around March 2018.  She tolerates it well and has not had any relapses while she is on it.   Of note, Tysabri was chosen because she has a moderately aggressive MS with 2 spinal cord lesions including one that enhanced consistent with her numbness.  She also has multiple brain foci and optic neuritis.  She is engaged and planning on being married April 2020 and quickly starting a family after that.   We discussed several options.  One would be to continue on Tysabri until she gets pregnant and then stop it.  Another would be to stay on Tysabri and switch to Copaxone when she becomes pregnant.  The third option would be to do cladribine this year and next year and then she should be able to get pregnant around December 2020 or later.  A final option is to similarly do [place] this year and next year and then she should be able to get pregnant around September or October 2020 or later.  She feels that her MS symptoms are doing well.  She has no difficulty with her gait or balance.  She denies any weakness and rarely feels any numbness.  Region returned to normal.  She continues to note some anxiety and depression but is doing better.  She has some fatigue but this is not bothering her too much.  Update 05/19/2017: She reports more stress and anxiety.   She tried Zoloft but sleep was worse so she stopped.   She has not tried Buspar.    We discussed some changes in medications and if not effective seeing a psychologist or psychiatrist may be of benefit.  Her MS is doing well on Tysabri.  She  tolerates it well and has had no exacerbation.    She notes no problems with gait, strength, sensation or bladder.    Vision has returned to normal  She notes some fatigue most days, not much different with heat.     This does not affect her much at work but she is more tired after work and sometimes skips social events.   She sleeps well in general (was worse with Zoloft).  From 01/06/2017 Mood:   She is having a lot more depression and anxiety, triggered (she thinks) by other friends moving on with their life and her having all these health issues (besides MS needed cervical LEEP procedure and close monitoring and multiple moles removed-one possibly a melanoma).  She also has a lot of stress at work.     She has frequent crying spells.   We had a long discussion about her mood, triggers, relationship to medical  issues therapy, etc   MS:   She continues on Tysabri. She tolerates it well. She is JCV antibody negative (last tested July 2018). She has not had any further exacerbations. Vision is essentially back to normal and her gait and sensation are practically baseline per   _____________________________________ From 11/16/2016  MS:   She started Tysabri in April 2018.   She is tolerating it well. She sometimes feels a little bit more tired the last week of each one month cycle. She was JCV antibody negative when tested in February. We will retest today.  Gait/strength/sensation: She denies any significant problem with her gait or strength. She did have some twitching in the right biceps and deltoid shortly after diagnosis and before starting Tysabri and some slight numbness in the right hand at the same time. The symptoms lasted about a week and have not returned.  Vision: She feels her vision is back to normal now.   She had left optic neuritis January 2018.  Bladder: She denies any difficulty with urinary frequency, urgency or hesitancy.  Mood/cognition:  She has not had any recent trouble with depression or anxiety. She had some mild depression and anxiety around the time of diagnosis but that has improved.   She denies any significant problems with cognitive function.  Fatigue/sleep:    She notes mild fatigue but it does not get in the way of her performing necessary activities of daily living. She sleeps well most nights.    She grinds her teeth at night and notes some TMJ-like pain on the right if she opens her mouth wide that preceded the current symptoms.   ENT:  She had severe otitis externa.   A combination of antibiotics and drops hekpled a lot.     MS History  Around 05/20/2016, she had the onset of left eye pain, worse with movement.   Symptoms started while driving and she noted looking to merge was painful. She saw her ophthalmologist and she had an MRI 06/03/2016 showing optic nerve  enhancement on the left and a second focus in the right hemisphere that also enhanced.     She noted mild blurry vision shortly after that but it seemed worse 06/05/2016 and the next day, vision was worse.    She went to [facility] ED and was admitted for 5 days of IV Solu-Medrol (3 days in hospital, 2 as outpatient).  There, she had an MRI of the cervical spine that showed 2 more lesions (at C4C5 and T1) consistent with MS.  In retrospect, a few weeks before  the visual symptoms, she noted right hand numbness, especially if she hold her phone which is persisting.     I have reviewed the MRI of the brain dated 06/03/2016 and the MRI of the cervical spine dated 06/08/2016.   The MRI of the brain showed enhancement of the left optic nerve. Additionally in the right temporal lobes there is another T2/FLAIR hyperintense focus that enhances. Additionally there is a periventricular right cerebellar focus that does not enhance. I also reviewed the MRI of the cervical spine. At C3-C4 there is a right lateral focus with subtle enhancement. There is also a right posterolateral focus adjacent to T1 that does not enhance.   Lab reports from her recent hospital stay were also evaluated. He is JCV antibody negative. Anti-NMO is negative.  She started Tysabri in March 2018.Marland Kitchen      REVIEW OF SYSTEMS: Constitutional: No fevers, chills, sweats, or change in appetite.   She denies much fatigue. Eyes: No visual changes, double vision, eye pain Ear, nose and throat: No hearing loss, ear pain, nasal congestion, sore throat Cardiovascular: No chest pain, palpitations Respiratory: No shortness of breath at rest or with exertion.   No wheezes GastrointestinaI: No nausea, vomiting, diarrhea, abdominal pain, fecal incontinence,    Has constipation Genitourinary: No dysuria, urinary retention or frequency.  No nocturia.  Has had some yeast infections.  Musculoskeletal: No neck pain, back pain Integumentary: No rash, pruritus, skin  lesions Neurological: as above Psychiatric: Mood is doing well. Endocrine: No palpitations, diaphoresis, change in appetite, change in weigh or increased thirst Hematologic/Lymphatic: No anemia, purpura, petechiae. Allergic/Immunologic: No itchy/runny eyes, nasal congestion, recent allergic reactions, rashes  ALLERGIES: No Known Allergies  HOME MEDICATIONS:  Current Outpatient Medications:  .  Cholecalciferol (VITAMIN D3) 5000 units CAPS, Take by mouth., Disp: , Rfl:  .  lactobacillus acidophilus (BACID) TABS tablet, Take 2 tablets by mouth 3 (three) times daily., Disp: , Rfl:  .  natalizumab (TYSABRI) 300 MG/15ML injection, Inject 300 mg into the vein every 30 (thirty) days., Disp: , Rfl:  .  UNABLE TO FIND, Methylcobalamin 1 mg daily, Disp: , Rfl:  .  zinc gluconate 50 MG tablet, Take 50 mg by mouth., Disp: , Rfl:  .  amphetamine-dextroamphetamine (ADDERALL XR) 15 MG 24 hr capsule, Take 1 capsule by mouth every morning., Disp: 30 capsule, Rfl: 0  PAST MEDICAL HISTORY: Past Medical History:  Diagnosis Date  . Vision abnormalities     PAST SURGICAL HISTORY: Past Surgical History:  Procedure Laterality Date  . LEEP     suppose to have follow up of cervical cells in January    FAMILY HISTORY: Family History  Problem Relation Age of Onset  . Heart disease Mother   . Healthy Father     SOCIAL HISTORY:  Social History   Socioeconomic History  . Marital status: Single    Spouse name: Not on file  . Number of children: Not on file  . Years of education: Not on file  . Highest education level: Not on file  Occupational History  . Not on file  Social Needs  . Financial resource strain: Not on file  . Food insecurity:    Worry: Not on file    Inability: Not on file  . Transportation needs:    Medical: Not on file    Non-medical: Not on file  Tobacco Use  . Smoking status: Never Smoker  . Smokeless tobacco: Never Used  Substance and Sexual Activity  . Alcohol  use: Yes    Comment: occasional  . Drug use: No  . Sexual activity: Not on file  Lifestyle  . Physical activity:    Days per week: Not on file    Minutes per session: Not on file  . Stress: Not on file  Relationships  . Social connections:    Talks on phone: Not on file    Gets together: Not on file    Attends religious service: Not on file    Active member of club or organization: Not on file    Attends meetings of clubs or organizations: Not on file    Relationship status: Not on file  . Intimate partner violence:    Fear of current or ex partner: Not on file    Emotionally abused: Not on file    Physically abused: Not on file    Forced sexual activity: Not on file  Other Topics Concern  . Not on file  Social History Narrative  . Not on file     PHYSICAL EXAM  BP 117/76   Pulse 87   Resp 16   Ht 5\' 5"  (1.651 m)   Wt 142 lb (64.4 kg)   BMI 23.63 kg/m    Body mass index is 23.63 kg/m.   General: The patient is well-developed and well-nourished and in no acute distress   HEENT:   Head is normocephalic and atraumatic.  The ear is no longer tender  Neurologic Exam  Mental status: She cried several times today. Affect is not flat. The patient is alert and oriented x 3 at the time of the examination. The patient has apparent normal recent and remote memory, with an apparently normal attention span and concentration ability.   Speech is normal.  Cranial nerves: Extraocular movements are full.  Facial strength and sensation is normal.  Trapezius strength is normal.  No obvious hearing deficits are noted.  Motor:  Muscle bulk is normal.   Tone is normal. Strength is  5 / 5 in all 4 extremities.   Sensory: She symmetric sensation to touch in the arms and the legs.   Gait and station: Station is normal.   Gait and tandem gait are normal.  Reflexes: Deep tendon reflexes are symmetric and normal bilaterally.       DIAGNOSTIC DATA (LABS, IMAGING, TESTING) - I  reviewed patient records, labs, notes, testing and imaging myself where available.  Lab Results  Component Value Date   WBC 9.7 09/19/2017   HGB 12.5 09/19/2017   HCT 38.2 09/19/2017   MCV 91 09/19/2017   PLT 246 09/19/2017      Component Value Date/Time   NA 141 09/19/2017 0917   K 4.5 09/19/2017 0917   CL 103 09/19/2017 0917   CO2 24 09/19/2017 0917   GLUCOSE 80 09/19/2017 0917   GLUCOSE 112 (H) 03/27/2016 2054   BUN 9 09/19/2017 0917   CREATININE 0.57 09/19/2017 0917   CALCIUM 9.2 09/19/2017 0917   PROT 6.6 09/19/2017 0917   ALBUMIN 4.4 09/19/2017 0917   AST 15 09/19/2017 0917   ALT 13 09/19/2017 0917   ALKPHOS 67 09/19/2017 0917   BILITOT 0.3 09/19/2017 0917   GFRNONAA 125 09/19/2017 0917   GFRAA 144 09/19/2017 0917       ASSESSMENT AND PLAN  Multiple sclerosis (HCC)  Optic neuritis  Transverse myelitis (HCC)  Numbness  Other fatigue  Attention deficit disorder (ADD) without hyperactivity  Vitamin D deficiency - Plan: VITAMIN D 25 Hydroxy (Vit-D  Deficiency, Fractures)    1.    She has an MS of moderate aggressiveness having 2 lesions in her spinal cord.  She will be getting married early next year and desires to start a family right away (hoping to get pregnant around April 2020).    We had a long conversation about a couple different options.  She could stay on Tysabri until she is pregnant and then either stop or we could prolong the interval between doses.  We discussed that staying on Tysabri could lead to some temporary hematologic abnormalities when the baby is born.  Another option would be to take Julaine Hua, around January and this would give her a window to get pregnant between April and the end of the year.  A third option would be to take [facility] as soon as possible giving her a window between March/April 2020 in about 5 months later.  A final option would be to switch to Ocrevus and have 1 dose in February.   We had previously check some lab work.     2.    Continue other med's   3.     Check vitamin D.  She has been deficient in the past.   4.    Return in 5-6 months or sooner if there are new or worsening neurologic symptoms and depending on options of treatment.     40 minutes face-to-face interaction with the majority of the time counseling and coordinating about family planning issues related to her MS and disease modifying therapies. Richard A. Epimenio Foot, MD, PhD 12/28/2017, 11:58 AM Certified in Neurology, Clinical Neurophysiology, Sleep Medicine, Pain Medicine and Neuroimaging  [facility] [address] [place], [place] [address] [phone]

## 2017-12-29 ENCOUNTER — Telehealth: Payer: Self-pay | Admitting: *Deleted

## 2017-12-29 LAB — VITAMIN D 25 HYDROXY (VIT D DEFICIENCY, FRACTURES): VIT D 25 HYDROXY: 25 ng/mL — AB (ref 30.0–100.0)

## 2017-12-29 MED ORDER — VITAMIN D (ERGOCALCIFEROL) 1.25 MG (50000 UNIT) PO CAPS: 50000.0000 [IU] | ORAL_CAPSULE | ORAL | 0 refills | Status: DC

## 2017-12-29 NOTE — Telephone Encounter (Signed)
Spoke with Brittany Johnston and reviewed below lab results, and need for rx. Vit. D for 12 wks, then otc Vit. D 5,000iu daily.  She verbalized understanding of same.  Rx. faxed to [facility] per her request/fim

## 2017-12-29 NOTE — Telephone Encounter (Signed)
-----   Message from Asa Lente, MD sent at 12/29/2017  1:16 PM EDT ----- Please let the patient know that the vit d is mildly low    50000 U weekly x 12 weeks then 5000 U daily OTC

## 2017-12-30 ENCOUNTER — Telehealth: Payer: Self-pay | Admitting: Neurology

## 2017-12-30 NOTE — Telephone Encounter (Signed)
Spoke with Nicholaus Bloom.  She wanted to review tx. plan for MS in case of pregnancy, as discussed in last ov.  (Stay on Tysabri until pregnant, then switch to Copaxone).  I answered some questions regarding what the process of starting Copaxone would be like, and how to coordinate that with stopping Tysabri.  She verbalized understanding of same/fim

## 2017-12-30 NOTE — Telephone Encounter (Signed)
Pt requesting a call to discuss switching to a new medication Copaxone. Please call to advise

## 2018-01-13 ENCOUNTER — Encounter: Payer: Self-pay | Admitting: *Deleted

## 2018-01-31 ENCOUNTER — Telehealth: Payer: Self-pay | Admitting: Neurology

## 2018-01-31 NOTE — Telephone Encounter (Signed)
error 

## 2018-05-10 ENCOUNTER — Other Ambulatory Visit: Payer: Self-pay | Admitting: *Deleted

## 2018-05-10 ENCOUNTER — Encounter: Payer: Self-pay | Admitting: *Deleted

## 2018-05-10 ENCOUNTER — Telehealth: Payer: Self-pay | Admitting: Neurology

## 2018-05-10 ENCOUNTER — Other Ambulatory Visit: Payer: Self-pay

## 2018-05-10 MED ORDER — AMPHETAMINE-DEXTROAMPHET ER 25 MG PO CP24: 25.0000 mg | ORAL_CAPSULE | ORAL | 0 refills | Status: DC

## 2018-05-10 NOTE — Telephone Encounter (Signed)
Matt @[employer]  Financial Assistance Dept has called stating pt is applying for  [employer]  Susy Frizzle is asking if Dr Epimenio Foot will allow for them to send a complimentary dose of the Tysabri out for pt.  Please call [phone] ask for  Financial Assistance dept

## 2018-05-10 NOTE — Telephone Encounter (Signed)
I will speak with Brittany Johnston in infusion suite about this.

## 2018-05-10 NOTE — Telephone Encounter (Signed)
I called [employer] back and spoke with Rakael. Advised okay to send complimentary Tysabri dose to our office for pt. Brittany Johnston aware. They reached out to pharmacy to verify this was okay and will process this. Medication will be shipped out today and should arrive tomorrow. Advised we will contact the patient to schedule infusion once we receive medication. She verbalized understanding.

## 2018-06-20 DIAGNOSIS — L299 Pruritus, unspecified: Secondary | ICD-10-CM | POA: Insufficient documentation

## 2018-06-20 DIAGNOSIS — H93293 Other abnormal auditory perceptions, bilateral: Secondary | ICD-10-CM | POA: Insufficient documentation

## 2018-06-30 ENCOUNTER — Telehealth: Payer: Self-pay | Admitting: *Deleted

## 2018-06-30 ENCOUNTER — Encounter: Payer: Self-pay | Admitting: Neurology

## 2018-06-30 ENCOUNTER — Ambulatory Visit: Payer: BLUE CROSS/BLUE SHIELD | Admitting: Neurology

## 2018-06-30 VITALS — BP 110/76 | HR 90 | Ht 65.0 in | Wt 139.0 lb

## 2018-06-30 DIAGNOSIS — R5383 Other fatigue: Secondary | ICD-10-CM | POA: Diagnosis not present

## 2018-06-30 DIAGNOSIS — F418 Other specified anxiety disorders: Secondary | ICD-10-CM

## 2018-06-30 DIAGNOSIS — G35 Multiple sclerosis: Secondary | ICD-10-CM

## 2018-06-30 DIAGNOSIS — F988 Other specified behavioral and emotional disorders with onset usually occurring in childhood and adolescence: Secondary | ICD-10-CM | POA: Diagnosis not present

## 2018-06-30 DIAGNOSIS — E559 Vitamin D deficiency, unspecified: Secondary | ICD-10-CM

## 2018-06-30 DIAGNOSIS — Z79899 Other long term (current) drug therapy: Secondary | ICD-10-CM

## 2018-06-30 MED ORDER — AMPHETAMINE-DEXTROAMPHET ER 25 MG PO CP24: 25.0000 mg | ORAL_CAPSULE | ORAL | 0 refills | Status: DC

## 2018-06-30 NOTE — Telephone Encounter (Signed)
Placed JCV lab in [facility] lock box for routine lab pick up.  

## 2018-06-30 NOTE — Progress Notes (Signed)
[facility]  PATIENT: Brittany Johnston DOB: 1985-05-11  REFERRING DOCTOR OR PCP:  Aura Camps [facility])    Fax [phone]  _________________________________   HISTORICAL  CHIEF COMPLAINT:  Chief Complaint  Patient presents with  . Follow-up    RM 12, alone. Last seen 12/2017. Reports trouble remembering things (worsened within the last two months), tightness around neck (started this past weekend). No trouble eating, swallowing.   . Multiple Sclerosis    On Tysabri. Last infusion: 06/09/18. Next: 07/07/18. Last JCV 09/19/17, negative, titer: 0.08. NEEDS JCV LAB TODAY    HISTORY OF PRESENT ILLNESS:  Brittany Johnston is a 33 y.o. woman with MS on Tysabri.   Update 06/30/2018: She is on Tysabri and doing well with no exacerbation and good tolerability.   She is getting married soon and wants to have a family soon.  Her plans are to become pregnant in April or May.  We have discussed pregnancy plans in the past.  She will stay on Tysabri until she becomes pregnant and then switch to Copaxone during the pregnancy and then go back on Tysabri after delivery.  Gait is doing well.    Strength is good.   She has paresthesias (tight sensation around her neck).   Sometimes fingers feel ice cold   Vision is fine.   Bladder function is fine.   She notes some issues with mild memory loss and sometimes repeats herself.      Adderall has helped  Focus/attention some.    She sometimes has trouble falling asleep.   Her depression is much better and the anxiety is baseline.      Update 12/28/2017:  She is on Tysabri  She was at the beach for [place] days.   She had the onset of tingling in thebottom of the feet that becomes hot when she lays down.     It does not go past the ankles.   She deneis weakness.     She does feel more tired but not sleey.      She gets enough sleep at night.     She feels her mood is fine.  She cries when the feet are hurting more but she notes she is more  emotional.   She feels her focus and attention are decreased and she has been forgetful at work.   She was diagnosed with ADD as a teenager and took Adderall in middle and high school.      Update 09/19/17: She is recently engaged and is interested in family planning issues.     She is currently on Tysabri, having started around March 2018.  She tolerates it well and has not had any relapses while she is on it.   Of note, Tysabri was chosen because she has a moderately aggressive MS with 2 spinal cord lesions including one that enhanced consistent with her numbness.  She also has multiple brain foci and optic neuritis.  She is engaged and planning on being married April 2020 and quickly starting a family after that.  We discussed several options.  One would be to continue on Tysabri until she gets pregnant and then stop it.  Another would be to stay on Tysabri and switch to Copaxone when she becomes pregnant.  The third option would be to do cladribine this year and next year and then she should be able to get pregnant around December 2020 or later.  A final option is to similarly do [place] this  year and next year and then she should be able to get pregnant around September or October 2020 or later.  She feels that her MS symptoms are doing well.  She has no difficulty with her gait or balance.  She denies any weakness and rarely feels any numbness.  Region returned to normal.  She continues to note some anxiety and depression but is doing better.  She has some fatigue but this is not bothering her too much.  Update 05/19/2017: She reports more stress and anxiety.   She tried Zoloft but sleep was worse so she stopped.   She has not tried Buspar.    We discussed some changes in medications and if not effective seeing a psychologist or psychiatrist may be of benefit.  Her MS is doing well on Tysabri.  She tolerates it well and has had no exacerbation.    She notes no problems with gait, strength,  sensation or bladder.    Vision has returned to normal  She notes some fatigue most days, not much different with heat.     This does not affect her much at work but she is more tired after work and sometimes skips social events.   She sleeps well in general (was worse with Zoloft).  From 01/06/2017 Mood:   She is having a lot more depression and anxiety, triggered (she thinks) by other friends moving on with their life and her having all these health issues (besides MS needed cervical LEEP procedure and close monitoring and multiple moles removed-one possibly a melanoma).  She also has a lot of stress at work.     She has frequent crying spells.   We had a long discussion about her mood, triggers, relationship to medical issues therapy, etc   MS:   She continues on Tysabri. She tolerates it well. She is JCV antibody negative (last tested July 2018). She has not had any further exacerbations. Vision is essentially back to normal and her gait and sensation are practically baseline per   _____________________________________ From 11/16/2016  MS:   She started Tysabri in April 2018.   She is tolerating it well. She sometimes feels a little bit more tired the last week of each one month cycle. She was JCV antibody negative when tested in February. We will retest today.  Gait/strength/sensation: She denies any significant problem with her gait or strength. She did have some twitching in the right biceps and deltoid shortly after diagnosis and before starting Tysabri and some slight numbness in the right hand at the same time. The symptoms lasted about a week and have not returned.  Vision: She feels her vision is back to normal now.   She had left optic neuritis January 2018.  Bladder: She denies any difficulty with urinary frequency, urgency or hesitancy.  Mood/cognition:  She has not had any recent trouble with depression or anxiety. She had some mild depression and anxiety around the time of  diagnosis but that has improved.   She denies any significant problems with cognitive function.  Fatigue/sleep:    She notes mild fatigue but it does not get in the way of her performing necessary activities of daily living. She sleeps well most nights.    She grinds her teeth at night and notes some TMJ-like pain on the right if she opens her mouth wide that preceded the current symptoms.   ENT:  She had severe otitis externa.   A combination of antibiotics and drops hekpled a  lot.     MS History  Around 05/20/2016, she had the onset of left eye pain, worse with movement.   Symptoms started while driving and she noted looking to merge was painful. She saw her ophthalmologist and she had an MRI 06/03/2016 showing optic nerve enhancement on the left and a second focus in the right hemisphere that also enhanced.     She noted mild blurry vision shortly after that but it seemed worse 06/05/2016 and the next day, vision was worse.    She went to the [facility] ED and was admitted for 5 days of IV Solu-Medrol (3 days in hospital, 2 as outpatient).  There, she had an MRI of the cervical spine that showed 2 more lesions (at C4C5 and T1) consistent with MS.  In retrospect, a few weeks before the visual symptoms, she noted right hand numbness, especially if she hold her phone which is persisting.     I have reviewed the MRI of the brain dated 06/03/2016 and the MRI of the cervical spine dated 06/08/2016.   The MRI of the brain showed enhancement of the left optic nerve. Additionally in the right temporal lobes there is another T2/FLAIR hyperintense focus that enhances. Additionally there is a periventricular right cerebellar focus that does not enhance. I also reviewed the MRI of the cervical spine. At C3-C4 there is a right lateral focus with subtle enhancement. There is also a right posterolateral focus adjacent to T1 that does not enhance.   Lab reports from her recent hospital stay were also evaluated. He is JCV antibody  negative. Anti-NMO is negative.  She started Tysabri in March 2018.Marland Kitchen      REVIEW OF SYSTEMS: Constitutional: No fevers, chills, sweats, or change in appetite.   She denies much fatigue. Eyes: No visual changes, double vision, eye pain Ear, nose and throat: No hearing loss, ear pain, nasal congestion, sore throat Cardiovascular: No chest pain, palpitations Respiratory: No shortness of breath at rest or with exertion.   No wheezes GastrointestinaI: No nausea, vomiting, diarrhea, abdominal pain, fecal incontinence,    Has constipation Genitourinary: No dysuria, urinary retention or frequency.  No nocturia.  Has had some yeast infections.  Musculoskeletal: No neck pain, back pain Integumentary: No rash, pruritus, skin lesions Neurological: as above Psychiatric: Mood is doing well. Endocrine: No palpitations, diaphoresis, change in appetite, change in weigh or increased thirst Hematologic/Lymphatic: No anemia, purpura, petechiae. Allergic/Immunologic: No itchy/runny eyes, nasal congestion, recent allergic reactions, rashes  ALLERGIES: No Known Allergies  HOME MEDICATIONS:  Current Outpatient Medications:  .  amphetamine-dextroamphetamine (ADDERALL XR) 25 MG 24 hr capsule, Take 1 capsule by mouth every morning., Disp: 30 capsule, Rfl: 0 .  Cholecalciferol (VITAMIN D3) 5000 units CAPS, Take by mouth., Disp: , Rfl:  .  lactobacillus acidophilus (BACID) TABS tablet, Take 2 tablets by mouth 3 (three) times daily., Disp: , Rfl:  .  natalizumab (TYSABRI) 300 MG/15ML injection, Inject 300 mg into the vein every 30 (thirty) days., Disp: , Rfl:  .  UNABLE TO FIND, Methylcobalamin 1 mg daily, Disp: , Rfl:  .  UNABLE TO FIND, Take 1 Dose by mouth daily. Med Name: CYPRESS PRENATABS, Disp: , Rfl:  .  zinc gluconate 50 MG tablet, Take 50 mg by mouth., Disp: , Rfl:   PAST MEDICAL HISTORY: Past Medical History:  Diagnosis Date  . Vision abnormalities     PAST SURGICAL HISTORY: Past Surgical  History:  Procedure Laterality Date  . LEEP  suppose to have follow up of cervical cells in January    FAMILY HISTORY: Family History  Problem Relation Age of Onset  . Heart disease Mother   . Healthy Father     SOCIAL HISTORY:  Social History   Socioeconomic History  . Marital status: Single    Spouse name: Not on file  . Number of children: Not on file  . Years of education: Not on file  . Highest education level: Not on file  Occupational History  . Not on file  Social Needs  . Financial resource strain: Not on file  . Food insecurity:    Worry: Not on file    Inability: Not on file  . Transportation needs:    Medical: Not on file    Non-medical: Not on file  Tobacco Use  . Smoking status: Never Smoker  . Smokeless tobacco: Never Used  Substance and Sexual Activity  . Alcohol use: Yes    Comment: occasional  . Drug use: No  . Sexual activity: Not on file  Lifestyle  . Physical activity:    Days per week: Not on file    Minutes per session: Not on file  . Stress: Not on file  Relationships  . Social connections:    Talks on phone: Not on file    Gets together: Not on file    Attends religious service: Not on file    Active member of club or organization: Not on file    Attends meetings of clubs or organizations: Not on file    Relationship status: Not on file  . Intimate partner violence:    Fear of current or ex partner: Not on file    Emotionally abused: Not on file    Physically abused: Not on file    Forced sexual activity: Not on file  Other Topics Concern  . Not on file  Social History Narrative  . Not on file     PHYSICAL EXAM  BP 110/76 (BP Location: Left Arm, Patient Position: Sitting, Cuff Size: Normal)   Pulse 90   Ht  (1.651 m)   Wt 139 lb (63 kg)   BMI 23.13 kg/m    Body mass index is 23.13 kg/m.   General: The patient is well-developed and well-nourished and in no acute distress   HEENT:   Head is normocephalic  and atraumatic.  The ear is no longer tender  Neurologic Exam  Mental status: She has a good affect.. The patient is alert and oriented x 3 at the time of the examination. The patient has apparent normal recent and remote memory, with an apparently normal attention span and concentration ability.   Speech is normal.  Cranial nerves: Extraocular movements are full.  Facial strength and sensation is normal.  Trapezius strength is normal.  No obvious hearing deficits are noted.  Motor:  Muscle bulk is normal.   Tone is normal. Strength is  5 / 5 in all 4 extremities.   Sensory: She symmetric sensation to touch in the arms and the legs.   Gait and station: Station is normal.   Gait and tandem gait are normal..  Reflexes: Deep tendon reflexes are symmetric and normal bilaterally.       DIAGNOSTIC DATA (LABS, IMAGING, TESTING) - I reviewed patient records, labs, notes, testing and imaging myself where available.  Lab Results  Component Value Date   WBC 9.7 09/19/2017   HGB 12.5 09/19/2017   HCT 38.2 09/19/2017  MCV 91 09/19/2017   PLT 246 09/19/2017      Component Value Date/Time   NA 141 09/19/2017 0917   K 4.5 09/19/2017 0917   CL 103 09/19/2017 0917   CO2 24 09/19/2017 0917   GLUCOSE 80 09/19/2017 0917   GLUCOSE 112 (H) 03/27/2016 2054   BUN 9 09/19/2017 0917   CREATININE 0.57 09/19/2017 0917   CALCIUM 9.2 09/19/2017 0917   PROT 6.6 09/19/2017 0917   ALBUMIN 4.4 09/19/2017 0917   AST 15 09/19/2017 0917   ALT 13 09/19/2017 0917   ALKPHOS 67 09/19/2017 0917   BILITOT 0.3 09/19/2017 0917   GFRNONAA 125 09/19/2017 0917   GFRAA 144 09/19/2017 0917       ASSESSMENT AND PLAN  Multiple sclerosis (HCC) - Plan: CBC with Differential/Platelet, Stratify JCV Antibody Test ([facility]), VITAMIN D 25 Hydroxy (Vit-D Deficiency, Fractures), MR BRAIN WO CONTRAST  High risk medication use - Plan: CBC with Differential/Platelet, Stratify JCV Antibody Test ([facility])  Attention deficit  disorder (ADD) without hyperactivity  Other fatigue  Depression with anxiety  Vitamin D deficiency - Plan: VITAMIN D 25 Hydroxy (Vit-D Deficiency, Fractures)    1.     Continue Tysabri.  Check JCV antibody.  We will stop the Tysabri when she becomes pregnant (hoping to do so this spring or early summer after her wedding in 6 weeks).  Once pregnant, as she does have potential for a more aggressive MS, she will go on Copaxone injections.  Then, after delivery she will get back on the Tysabri.  We will check an MRI of the brain to determine if she is having subclinical progression. 2.    Continue other med's.  Renew Adderall. 3.     Check vitamin D.  She has been deficient in the past.   4.    Return in 5-6 months or sooner if there are new or worsening neurologic symptoms and depending on options of treatment.       Richard A. Epimenio Foot, MD, PhD 06/30/2018, 9:45 AM Certified in Neurology, Clinical Neurophysiology, Sleep Medicine, Pain Medicine and Neuroimaging  [facility] [address] [place], [place] [address] [phone]

## 2018-07-01 LAB — CBC WITH DIFFERENTIAL/PLATELET
Basophils Absolute: 0 10*3/uL (ref 0.0–0.2)
Basos: 0 %
EOS (ABSOLUTE): 0.1 10*3/uL (ref 0.0–0.4)
Eos: 2 %
Hematocrit: 38.5 % (ref 34.0–46.6)
Hemoglobin: 12.8 g/dL (ref 11.1–15.9)
IMMATURE GRANULOCYTES: 0 %
Immature Grans (Abs): 0 10*3/uL (ref 0.0–0.1)
Lymphocytes Absolute: 3.5 10*3/uL — ABNORMAL HIGH (ref 0.7–3.1)
Lymphs: 46 %
MCH: 30 pg (ref 26.6–33.0)
MCHC: 33.2 g/dL (ref 31.5–35.7)
MCV: 90 fL (ref 79–97)
MONOS ABS: 0.8 10*3/uL (ref 0.1–0.9)
Monocytes: 10 %
NEUTROS PCT: 42 %
Neutrophils Absolute: 3.1 10*3/uL (ref 1.4–7.0)
PLATELETS: 279 10*3/uL (ref 150–450)
RBC: 4.27 x10E6/uL (ref 3.77–5.28)
RDW: 12.4 % (ref 11.7–15.4)
WBC: 7.5 10*3/uL (ref 3.4–10.8)

## 2018-07-01 LAB — VITAMIN D 25 HYDROXY (VIT D DEFICIENCY, FRACTURES): Vit D, 25-Hydroxy: 43.1 ng/mL (ref 30.0–100.0)

## 2018-07-03 ENCOUNTER — Telehealth: Payer: Self-pay | Admitting: *Deleted

## 2018-07-03 ENCOUNTER — Telehealth: Payer: Self-pay | Admitting: Neurology

## 2018-07-03 DIAGNOSIS — G35 Multiple sclerosis: Secondary | ICD-10-CM

## 2018-07-03 NOTE — Telephone Encounter (Signed)
MR Brain wo contrast Dr. Gershon Mussel Auth: [id number] (exp. 06/30/18 to 08/28/18). Patient is scheduled at [facility] for 07/05/18

## 2018-07-03 NOTE — Telephone Encounter (Signed)
-----   Message from Asa Lente, MD sent at 07/02/2018  4:55 PM EST ----- Please let her know that the vitamin D is now fine.  She can take 5000 units daily OTC.

## 2018-07-03 NOTE — Telephone Encounter (Signed)
I called and spoke with pt about lab results per Dr. Epimenio Foot note. She verbalized understanding.   She wanted a little more information about staying on Tysabri while she is pregnant. Stated Dr. Epimenio Foot discussed he had another pt who did this and did well. She was wanting contact info to pt but advised we are unable to provide d/t HIPAA laws. Advised I will discuss with Dr. Epimenio Foot and call her back. She verbalized understanding.

## 2018-07-05 ENCOUNTER — Other Ambulatory Visit: Payer: BLUE CROSS/BLUE SHIELD

## 2018-07-10 ENCOUNTER — Telehealth: Payer: Self-pay | Admitting: *Deleted

## 2018-07-10 NOTE — Telephone Encounter (Signed)
JCV ab negative at 0.14. Collected 06/30/18/fim

## 2018-07-18 ENCOUNTER — Other Ambulatory Visit: Payer: BLUE CROSS/BLUE SHIELD

## 2018-07-20 ENCOUNTER — Telehealth: Payer: Self-pay | Admitting: *Deleted

## 2018-07-20 NOTE — Telephone Encounter (Signed)
Faxed completed/signed Tysabri pt status report and reauth questionnaire to [employer] at [phone]. Received confirmation.  

## 2018-07-21 NOTE — Telephone Encounter (Signed)
Received fax notification from [employer] prescribing program that pt re-authorized 07/20/18-02/13/19. Enrollment#: [id number]. Account: [facility]. Site auth#: [id number].

## 2018-07-27 ENCOUNTER — Telehealth: Payer: Self-pay | Admitting: *Deleted

## 2018-07-27 NOTE — Telephone Encounter (Signed)
Received fax from [facility] requiring PA for her tysabri.  Completed and to be signed then will fax back.

## 2018-07-28 DIAGNOSIS — H60331 Swimmer's ear, right ear: Secondary | ICD-10-CM | POA: Insufficient documentation

## 2018-07-28 NOTE — Telephone Encounter (Signed)
Fax confirmation received [facility] [phone].

## 2018-08-01 NOTE — Telephone Encounter (Signed)
Received fax notification from [facility] that Tysbari approved 07/28/2018-07/28/2019. Gave to [employer] for their records.

## 2018-08-15 NOTE — Telephone Encounter (Signed)
Patient was scheduled in [facility] office for 08/22/18 but due to the Covid-19 MRI's are not being performed.. I spoke to the patient and she would like to go to [facility]. When you get a chance can you put a new order in for [facility] because it will need a new accession number for them to schedule it.

## 2018-08-15 NOTE — Telephone Encounter (Signed)
Noted, thanks. Updated [employer] Auth: [id number] (exp. 08/15/18 to 02/10/19) order sent to [facility]

## 2018-08-15 NOTE — Addendum Note (Signed)
Addended by: Eilene Ghazi L on: 08/15/2018 10:26 AM   Modules accepted: Orders

## 2018-08-16 ENCOUNTER — Other Ambulatory Visit: Payer: Self-pay

## 2018-08-16 ENCOUNTER — Ambulatory Visit
Admission: RE | Admit: 2018-08-16 | Discharge: 2018-08-16 | Disposition: A | Discharge status: Home / Self Care | Payer: BLUE CROSS/BLUE SHIELD | Source: Ambulatory Visit | Attending: Neurology | Admitting: Neurology

## 2018-08-16 DIAGNOSIS — G35 Multiple sclerosis: Secondary | ICD-10-CM | POA: Diagnosis not present

## 2018-08-22 ENCOUNTER — Other Ambulatory Visit: Payer: BLUE CROSS/BLUE SHIELD

## 2018-08-29 ENCOUNTER — Other Ambulatory Visit: Payer: Self-pay | Admitting: Neurology

## 2018-08-29 MED ORDER — AMPHETAMINE-DEXTROAMPHET ER 15 MG PO CP24: 15.0000 mg | ORAL_CAPSULE | ORAL | 0 refills | Status: DC

## 2018-09-12 ENCOUNTER — Telehealth: Payer: Self-pay | Admitting: *Deleted

## 2018-09-12 NOTE — Telephone Encounter (Signed)
Faxed completed/signed copaxone start form to [employer] at [phone]. Received fax confirmation.

## 2018-09-14 ENCOUNTER — Telehealth: Payer: Self-pay | Admitting: *Deleted

## 2018-09-14 NOTE — Telephone Encounter (Signed)
Submitted PA on CMM. Key: Key: [id number]. Waiting on determination.  "Your information has been submitted to [facility]. To check for an updated outcome later, reopen this PA request from your dashboard.  If [facility] has not responded to your request within 24 hours, contact [employer] at [phone]. If you think there may be a problem with your PA request, use our live chat feature at the bottom right."

## 2018-09-14 NOTE — Telephone Encounter (Signed)
Was able to initiate PA on CMM with [facility] pharmacy benefit info she emailed me. Key: [id number].   Received response: "Your demographic data has been sent to the plan successfully. They will respond with your clinical questions and you will be notified by email when available within the next business day."

## 2018-09-14 NOTE — Telephone Encounter (Signed)
Called insurance/pharmacy benefit back. Spoke with Riki Rusk with future scripts. He searched pt with updated name. Still unable to locate her account.   I called pt back to let her know they recommended she call customer service #. She now says she has BC[employer] and thinks her pharmacy benefit it [facility]. She will send a copy via [employer]. Advised once I receive this information I will proceed with completing PA. She verbalized understanding.

## 2018-09-14 NOTE — Telephone Encounter (Signed)
I called pt insurance at [phone] and transferred to pharmacy services (future scripts). Spoke with [name]. They could not find coverage for pt. She searched for [name].   I called pt. She has since been married and new name Brittany Johnston. She was given new insurance cards. Asked her to send a mychart message with updated cards. I will print and give to front desk to scan into her chart to update.  I will call insurance back and try to initiate PA under new name. She verbalized understanding.

## 2018-09-18 NOTE — Telephone Encounter (Signed)
Received fax notification from [facility] that PA Copaxone 40mg /ml approved 09/14/18-09/14/19. PA#[employer] [id number].

## 2018-10-02 ENCOUNTER — Telehealth: Payer: Self-pay | Admitting: *Deleted

## 2018-10-02 NOTE — Telephone Encounter (Signed)
Received fax notification from [facility] that pt plans to receive/start copaxone on or about 09/29/18.

## 2018-10-10 LAB — OB RESULTS CONSOLE ANTIBODY SCREEN: Antibody Screen: NEGATIVE

## 2018-10-10 LAB — OB RESULTS CONSOLE ABO/RH: RH Type: POSITIVE

## 2018-10-10 LAB — OB RESULTS CONSOLE GC/CHLAMYDIA
Chlamydia: NEGATIVE
Gonorrhea: NEGATIVE

## 2018-10-10 LAB — OB RESULTS CONSOLE RPR: RPR: NONREACTIVE

## 2018-10-10 LAB — OB RESULTS CONSOLE HIV ANTIBODY (ROUTINE TESTING): HIV: NONREACTIVE

## 2018-10-10 LAB — OB RESULTS CONSOLE RUBELLA ANTIBODY, IGM: Rubella: IMMUNE

## 2018-10-10 LAB — OB RESULTS CONSOLE HEPATITIS B SURFACE ANTIGEN: Hepatitis B Surface Ag: NEGATIVE

## 2018-10-13 ENCOUNTER — Telehealth: Payer: Self-pay | Admitting: Neurology

## 2018-10-13 NOTE — Telephone Encounter (Signed)
Jessica from [facility] called wanting to know if the generic of Glatiramer Acetate (COPAXONE) 40 MG/ML SOSY is ok or does it have to be the Brand name. Please advise.

## 2018-10-16 NOTE — Telephone Encounter (Signed)
Called back and was on hold for over 15 min. Sounded like call connected but no one on other line. Call disconnected on their end  Pt prescribed brand name Copaxone. She should receive brand name. PA that was completed was for brand and was approved: "Received fax notification from [facility] that PA Copaxone 40mg /ml approved 09/14/18-09/14/19. PA#[employer] [id number]."

## 2019-01-15 ENCOUNTER — Encounter: Payer: Self-pay | Admitting: *Deleted

## 2019-02-05 NOTE — Progress Notes (Deleted)
PATIENT: Brittany ReddenKelley M Hoobler DOB: 10/12/1985  REASON FOR VISIT: follow up HISTORY FROM: patient  No chief complaint on file.    HISTORY OF PRESENT ILLNESS: Today 02/05/19 Brittany Johnston is a 33 y.o. female here today for follow up.   HISTORY: (copied from Dr Bonnita HollowSater's note on 06/30/2018)  Brittany Johnston is a 33 y.o. woman with MS on Tysabri.   Update 06/30/2018: She is on Tysabri and doing well with no exacerbation and good tolerability.   She is getting married soon and wants to have a family soon.  Her plans are to become pregnant in April or May.  We have discussed pregnancy plans in the past.  She will stay on Tysabri until she becomes pregnant and then switch to Copaxone during the pregnancy and then go back on Tysabri after delivery.  Gait is doing well.    Strength is good.   She has paresthesias (tight sensation around her neck).   Sometimes fingers feel ice cold   Vision is fine.   Bladder function is fine.   She notes some issues with mild memory loss and sometimes repeats herself.      Adderall has helped  Focus/attention some.    She sometimes has trouble falling asleep.   Her depression is much better and the anxiety is baseline.      Update 12/28/2017:  She is on Tysabri  She was at the beach for several days.   She had the onset of tingling in thebottom of the feet that becomes hot when she lays down.     It does not go past the ankles.   She deneis weakness.     She does feel more tired but not sleey.      She gets enough sleep at night.     She feels her mood is fine.  She cries when the feet are hurting more but she notes she is more emotional.   She feels her focus and attention are decreased and she has been forgetful at work.   She was diagnosed with ADD as a teenager and took Adderall in middle and high school.      Update 09/19/17: She is recently engaged and is interested in family planning issues.     She is currently on Tysabri, having started around  March 2018.  She tolerates it well and has not had any relapses while she is on it.   Of note, Tysabri was chosen because she has a moderately aggressive MS with 2 spinal cord lesions including one that enhanced consistent with her numbness.  She also has multiple brain foci and optic neuritis.  She is engaged and planning on being married April 2020 and quickly starting a family after that.  We discussed several options.  One would be to continue on Tysabri until she gets pregnant and then stop it.  Another would be to stay on Tysabri and switch to Copaxone when she becomes pregnant.  The third option would be to do cladribine this year and next year and then she should be able to get pregnant around December 2020 or later.  A final option is to similarly do [place] this year and next year and then she should be able to get pregnant around September or October 2020 or later.  She feels that her MS symptoms are doing well.  She has no difficulty with her gait or balance.  She denies any weakness and rarely feels any numbness.  Region returned to normal.  She continues to note some anxiety and depression but is doing better.  She has some fatigue but this is not bothering her too much.  Update 05/19/2017: She reports more stress and anxiety.   She tried Zoloft but sleep was worse so she stopped.   She has not tried Buspar.    We discussed some changes in medications and if not effective seeing a psychologist or psychiatrist may be of benefit.  Her MS is doing well on Tysabri.  She tolerates it well and has had no exacerbation.    She notes no problems with gait, strength, sensation or bladder.    Vision has returned to normal  She notes some fatigue most days, not much different with heat.     This does not affect her much at work but she is more tired after work and sometimes skips social events.   She sleeps well in general (was worse with Zoloft).  From 01/06/2017 Mood:   She is having a lot more  depression and anxiety, triggered (she thinks) by other friends moving on with their life and her having all these health issues (besides MS needed cervical LEEP procedure and close monitoring and multiple moles removed-one possibly a melanoma).  She also has a lot of stress at work.     She has frequent crying spells.   We had a long discussion about her mood, triggers, relationship to medical issues therapy, etc   MS:   She continues on Tysabri. She tolerates it well. She is JCV antibody negative (last tested July 2018). She has not had any further exacerbations. Vision is essentially back to normal and her gait and sensation are practically baseline per   _____________________________________ From 11/16/2016  MS:   She started Tysabri in April 2018.   She is tolerating it well. She sometimes feels a little bit more tired the last week of each one month cycle. She was JCV antibody negative when tested in February. We will retest today.  Gait/strength/sensation: She denies any significant problem with her gait or strength. She did have some twitching in the right biceps and deltoid shortly after diagnosis and before starting Tysabri and some slight numbness in the right hand at the same time. The symptoms lasted about a week and have not returned.  Vision: She feels her vision is back to normal now.   She had left optic neuritis January 2018.  Bladder: She denies any difficulty with urinary frequency, urgency or hesitancy.  Mood/cognition:  She has not had any recent trouble with depression or anxiety. She had some mild depression and anxiety around the time of diagnosis but that has improved.   She denies any significant problems with cognitive function.  Fatigue/sleep:    She notes mild fatigue but it does not get in the way of her performing necessary activities of daily living. She sleeps well most nights.    She grinds her teeth at night and notes some TMJ-like pain on the right if  she opens her mouth wide that preceded the current symptoms.   ENT:  She had severe otitis externa.   A combination of antibiotics and drops hekpled a lot.     MS History  Around 05/20/2016, she had the onset of left eye pain, worse with movement.   Symptoms started while driving and she noted looking to merge was painful. She saw her ophthalmologist and she had an MRI 06/03/2016 showing optic nerve enhancement on the  left and a second focus in the right hemisphere that also enhanced.     She noted mild blurry vision shortly after that but it seemed worse 06/05/2016 and the next day, vision was worse.    She went to the [facility] ED and was admitted for 5 days of IV Solu-Medrol (3 days in hospital, 2 as outpatient).  There, she had an MRI of the cervical spine that showed 2 more lesions (at C4C5 and T1) consistent with MS.  In retrospect, a few weeks before the visual symptoms, she noted right hand numbness, especially if she hold her phone which is persisting.     I have reviewed the MRI of the brain dated 06/03/2016 and the MRI of the cervical spine dated 06/08/2016.   The MRI of the brain showed enhancement of the left optic nerve. Additionally in the right temporal lobes there is another T2/FLAIR hyperintense focus that enhances. Additionally there is a periventricular right cerebellar focus that does not enhance. I also reviewed the MRI of the cervical spine. At C3-C4 there is a right lateral focus with subtle enhancement. There is also a right posterolateral focus adjacent to T1 that does not enhance.   Lab reports from her recent hospital stay were also evaluated. He is JCV antibody negative. Anti-NMO is negative.  She started Tysabri in March 2018.Marland Kitchen    REVIEW OF SYSTEMS: Out of a complete 14 system review of symptoms, the patient complains only of the following symptoms, and all other reviewed systems are negative.  ALLERGIES: No Known Allergies  HOME MEDICATIONS: Outpatient Medications Prior to Visit    Medication Sig Dispense Refill   amphetamine-dextroamphetamine (ADDERALL XR) 15 MG 24 hr capsule Take 1 capsule by mouth every morning. 30 capsule 0   Cholecalciferol (VITAMIN D3) 5000 units CAPS Take by mouth.     Glatiramer Acetate (COPAXONE) 40 MG/ML SOSY Inject into the skin.     lactobacillus acidophilus (BACID) TABS tablet Take 2 tablets by mouth 3 (three) times daily.     UNABLE TO FIND Methylcobalamin 1 mg daily     UNABLE TO FIND Take 1 Dose by mouth daily. Med Name: CYPRESS PRENATABS     zinc gluconate 50 MG tablet Take 50 mg by mouth.     No facility-administered medications prior to visit.     PAST MEDICAL HISTORY: Past Medical History:  Diagnosis Date   Vision abnormalities     PAST SURGICAL HISTORY: Past Surgical History:  Procedure Laterality Date   LEEP     suppose to have follow up of cervical cells in January    FAMILY HISTORY: Family History  Problem Relation Age of Onset   Heart disease Mother    Healthy Father     SOCIAL HISTORY: Social History   Socioeconomic History   Marital status: Married    Spouse name: Not on file   Number of children: Not on file   Years of education: Not on file   Highest education level: Not on file  Occupational History   Not on file  Social Needs   Financial resource strain: Not on file   Food insecurity    Worry: Not on file    Inability: Not on file   Transportation needs    Medical: Not on file    Non-medical: Not on file  Tobacco Use   Smoking status: Never Smoker   Smokeless tobacco: Never Used  Substance and Sexual Activity   Alcohol use: Yes    Comment:  occasional   Drug use: No   Sexual activity: Not on file  Lifestyle   Physical activity    Days per week: Not on file    Minutes per session: Not on file   Stress: Not on file  Relationships   Social connections    Talks on phone: Not on file    Gets together: Not on file    Attends religious service: Not on file     Active member of club or organization: Not on file    Attends meetings of clubs or organizations: Not on file    Relationship status: Not on file   Intimate partner violence    Fear of current or ex partner: Not on file    Emotionally abused: Not on file    Physically abused: Not on file    Forced sexual activity: Not on file  Other Topics Concern   Not on file  Social History Narrative   Not on file      PHYSICAL EXAM  There were no vitals filed for this visit. There is no height or weight on file to calculate BMI.  Generalized: Well developed, in no acute distress  Cardiology: normal rate and rhythm, no murmur noted Neurological examination  Mentation: Alert oriented to time, place, history taking. Follows all commands speech and language fluent Cranial nerve II-XII: Pupils were equal round reactive to light. Extraocular movements were full, visual field were full on confrontational test. Facial sensation and strength were normal. Uvula tongue midline. Head turning and shoulder shrug  were normal and symmetric. Motor: The motor testing reveals 5 over 5 strength of all 4 extremities. Good symmetric motor tone is noted throughout.  Sensory: Sensory testing is intact to soft touch on all 4 extremities. No evidence of extinction is noted.  Coordination: Cerebellar testing reveals good finger-nose-finger and heel-to-shin bilaterally.  Gait and station: Gait is normal. Tandem gait is normal. Romberg is negative. No drift is seen.  Reflexes: Deep tendon reflexes are symmetric and normal bilaterally.   DIAGNOSTIC DATA (LABS, IMAGING, TESTING) - I reviewed patient records, labs, notes, testing and imaging myself where available.  No flowsheet data found.   Lab Results  Component Value Date   WBC 7.5 06/30/2018   HGB 12.8 06/30/2018   HCT 38.5 06/30/2018   MCV 90 06/30/2018   PLT 279 06/30/2018      Component Value Date/Time   NA 141 09/19/2017 0917   K 4.5 09/19/2017 0917    CL 103 09/19/2017 0917   CO2 24 09/19/2017 0917   GLUCOSE 80 09/19/2017 0917   GLUCOSE 112 (H) 03/27/2016 2054   BUN 9 09/19/2017 0917   CREATININE 0.57 09/19/2017 0917   CALCIUM 9.2 09/19/2017 0917   PROT 6.6 09/19/2017 0917   ALBUMIN 4.4 09/19/2017 0917   AST 15 09/19/2017 0917   ALT 13 09/19/2017 0917   ALKPHOS 67 09/19/2017 0917   BILITOT 0.3 09/19/2017 0917   GFRNONAA 125 09/19/2017 0917   GFRAA 144 09/19/2017 0917   No results found for: CHOL, HDL, LDLCALC, LDLDIRECT, TRIG, CHOLHDL No results found for: [id number] No results found for: VITAMINB12 Lab Results  Component Value Date   TSH 1.480 06/28/2016       ASSESSMENT AND PLAN 33 y.o. year old female  has a past medical history of Vision abnormalities. here with ***    ICD-10-CM   1. Multiple sclerosis (HCC)  G35   2. Pregnancy, unspecified gestational age  95Z34.90  No orders of the defined types were placed in this encounter.    No orders of the defined types were placed in this encounter.     I spent 15 minutes with the patient. 50% of this time was spent counseling and educating patient on plan of care and medications.    Debbora Presto, FNP-C 02/05/2019, 4:40 PM [facility] [address] [place], [place] [address] [phone]

## 2019-02-06 ENCOUNTER — Telehealth: Payer: Self-pay

## 2019-02-06 ENCOUNTER — Ambulatory Visit: Payer: Self-pay | Admitting: Family Medicine

## 2019-02-06 NOTE — Telephone Encounter (Signed)
Patient was a no call/no show for their appointment today.   

## 2019-02-07 ENCOUNTER — Encounter: Payer: Self-pay | Admitting: Family Medicine

## 2019-02-07 NOTE — Telephone Encounter (Signed)
Please do not charge pt for a no-show fee. Her appt on 02/06/19 was scheduled in error and should have been for 02/07/19.

## 2019-02-08 NOTE — Patient Instructions (Addendum)
Continue Capaxone injections as prescribed  We will update labs today  Use Tylenol (no more than 4000mg  daily) for migraines if needed. May use Fioricet if not responsive to Tylenol but avoid regular use.   Follow up in 4 months with Dr Felecia Shelling   Multiple Sclerosis Multiple sclerosis (MS) is a disease of the brain, spinal cord, and optic nerves (central nervous system). It causes the body's disease-fighting (immune) system to destroy the protective covering (myelin sheath) around nerves in the brain. When this happens, signals (nerve impulses) going to and from the brain and spinal cord do not get sent properly or may not get sent at all. There are several types of MS:  Relapsing-remitting MS. This is the most common type. This causes sudden attacks of symptoms. After an attack, you may recover completely until the next attack, or some symptoms may remain permanently.  Secondary progressive MS. This usually develops after the onset of relapsing-remitting MS. Similar to relapsing-remitting MS, this type also causes sudden attacks of symptoms. Attacks may be less frequent, but symptoms slowly get worse (progress) over time.  Primary progressive MS. This causes symptoms that steadily progress over time. This type of MS does not cause sudden attacks of symptoms. The age of onset of MS varies, but it often develops between 38-93 years of age. MS is a lifelong (chronic) condition. There is no cure, but treatment can help slow down the progression of the disease. What are the causes? The cause of this condition is not known. What increases the risk? You are more likely to develop this condition if:  You are a woman.  You have a relative with MS. However, the condition is not passed from parent to child (inherited).  You have a lack (deficiency) of vitamin D.  You smoke. MS is more common in the [place] than in the [place]. What are the signs or symptoms?  Relapsing-remitting and secondary progressive MS cause symptoms to occur in episodes or attacks that may last weeks to months. There may be long periods between attacks in which there are almost no symptoms. Primary progressive MS causes symptoms to steadily progress after they develop. Symptoms of MS vary because of the many different ways it affects the central nervous system. The main symptoms include:  Vision problems and eye pain.  Numbness.  Weakness.  Inability to move your arms, hands, feet, or legs (paralysis).  Balance problems.  Shaking that you cannot control (tremors).  Muscle spasms.  Problems with thinking (cognitive changes). MS can also cause symptoms that are associated with the disease, but are not always the direct result of an MS attack. They may include:  Inability to control urination or bowel movements (incontinence).  Headaches.  Fatigue.  Inability to tolerate heat.  Emotional changes.  Depression.  Pain. How is this diagnosed? This condition is diagnosed based on:  Your symptoms.  A neurological exam. This involves checking central nervous system function, such as nerve function, reflexes, and coordination.  MRIs of the brain and spinal cord.  Lab tests, including a lumbar puncture that tests the fluid that surrounds the brain and spinal cord (cerebrospinal fluid).  Tests to measure the electrical activity of the brain in response to stimulation (evoked potentials). How is this treated? There is no cure for MS, but medicines can help decrease the number and frequency of attacks and help relieve nuisance symptoms. Treatment options may include:  Medicines that reduce the frequency of attacks. These medicines may  be given by injection, by mouth (orally), or through an IV.  Medicines that reduce inflammation (steroids). These may provide short-term relief of symptoms.  Medicines to help control pain, depression, fatigue, or incontinence.   Vitamin D, if you have a deficiency.  Using devices to help you move around (assistive devices), such as braces, a cane, or a walker.  Physical therapy to strengthen and stretch your muscles.  Occupational therapy to help you with everyday tasks.  Alternative or complementary treatments such as exercise, massage, or acupuncture. Follow these instructions at home:  Take over-the-counter and prescription medicines only as told by your health care provider.  Do not drive or use heavy machinery while taking prescription pain medicine.  Use assistive devices as recommended by your physical therapist or your health care provider.  Exercise as directed by your health care provider.  Return to your normal activities as told by your health care provider. Ask your health care provider what activities are safe for you.  Reach out for support. Share your feelings with friends, family, or a support group.  Keep all follow-up visits as told by your health care provider and therapists. This is important. Where to find more information  National Multiple Sclerosis Society: https://www.nationalmssociety.org Contact a health care provider if:  You feel depressed.  You develop new pain or numbness.  You have tremors.  You have problems with sexual function. Get help right away if:  You develop paralysis.  You develop numbness.  You have problems with your bladder or bowel function.  You develop double vision.  You lose vision in one or both eyes.  You develop suicidal thoughts.  You develop severe confusion. If you ever feel like you may hurt yourself or others, or have thoughts about taking your own life, get help right away. You can go to your nearest emergency department or call:  Your local emergency services (911 in the [place]  A suicide crisis helpline, such as the National Suicide Prevention Lifeline at (631) 336-6896. This is open 24 hours a day. Summary  Multiple  sclerosis (MS) is a disease of the central nervous system that causes the body's immune system to destroy the protective covering (myelin sheath) around nerves in the brain.  There are 3 types of MS: relapsing-remitting, secondary progressive, and primary progressive. Relapsing-remitting and secondary progressive MS cause symptoms to occur in episodes or attacks that may last weeks to months. Primary progressive MS causes symptoms to steadily progress after they develop.  There is no cure for MS, but medicines can help decrease the number and frequency of attacks and help relieve nuisance symptoms. Treatment may also include physical or occupational therapy.  If you develop numbness, paralysis, vision problems, or other neurological symptoms, get help right away. This information is not intended to replace advice given to you by your health care provider. Make sure you discuss any questions you have with your health care provider. Document Released: 04/16/2000 Document Revised: 04/01/2017 Document Reviewed: 06/28/2016 Elsevier Patient Education  2020 [employer].

## 2019-02-08 NOTE — Progress Notes (Signed)
PATIENT: Brittany Johnston DOB: 08-Apr-1986  REASON FOR VISIT: follow up HISTORY FROM: patient  Chief Complaint  Patient presents with   Follow-up    Room 1, with mother. MS f/u. "Concerned about the injections. Was in the hospital Friday for eye pain, treated for a migraine. The Migraine treatment seemed to work."     HISTORY OF PRESENT ILLNESS: Today 02/12/19 Brittany Johnston is a 33 y.o. female here today for follow up for MS. She is pregnant and was switched from [place] to [place] in May. She has noted injection site redness and tenderness with injections but otherwise tolerated well. No respiratory or anaphylactic symptoms. Over the weekend she had left eye pressure that resulted in an ER evaluation. No vision loss or blurred vision. No pain with movement of eye. She was treated for a migraine with migraine cocktail. Pain resolved and has not returned. She has a history of migraines and feels that stress is a trigger. Tylenol usually help with headache. She was given Fioricet by OB for abortive therapy but is not using often. She is planned to deliver 05/23/2019. "Brittany Johnston."  She does not plan to breast feed and would like to restart Tysabri after delivery.   No new MS symptoms.  She has an appointment today with ophthalmology for evaluation post left eye pain.  HISTORY: (copied from Dr Bonnita Hollow note on 06/30/2018)  Brittany Johnston is a 33 y.o. woman with MS on Tysabri.   Update 06/30/2018: She is on Tysabri and doing well with no exacerbation and good tolerability.   She is getting married soon and wants to have a family soon.  Her plans are to become pregnant in April or May.  We have discussed pregnancy plans in the past.  She will stay on Tysabri until she becomes pregnant and then switch to Copaxone during the pregnancy and then go back on Tysabri after delivery.  Gait is doing well.    Strength is good.   She has paresthesias (tight sensation around her neck).   Sometimes  fingers feel ice cold   Vision is fine.   Bladder function is fine.   She notes some issues with mild memory loss and sometimes repeats herself.      Adderall has helped  Focus/attention some.    She sometimes has trouble falling asleep.   Her depression is much better and the anxiety is baseline.      Update 12/28/2017:  She is on Tysabri  She was at the beach for [place] days.   She had the onset of tingling in thebottom of the feet that becomes hot when she lays down.     It does not go past the ankles.   She deneis weakness.     She does feel more tired but not sleey.      She gets enough sleep at night.     She feels her mood is fine.  She cries when the feet are hurting more but she notes she is more emotional.   She feels her focus and attention are decreased and she has been forgetful at work.   She was diagnosed with ADD as a teenager and took Adderall in middle and high school.      Update 09/19/17: She is recently engaged and is interested in family planning issues.     She is currently on Tysabri, having started around March 2018.  She tolerates it well and has not had any relapses while  she is on it.   Of note, Tysabri was chosen because she has a moderately aggressive MS with 2 spinal cord lesions including one that enhanced consistent with her numbness.  She also has multiple brain foci and optic neuritis.  She is engaged and planning on being married April 2020 and quickly starting a family after that.  We discussed several options.  One would be to continue on Tysabri until she gets pregnant and then stop it.  Another would be to stay on Tysabri and switch to Copaxone when she becomes pregnant.  The third option would be to do cladribine this year and next year and then she should be able to get pregnant around December 2020 or later.  A final option is to similarly do [place] this year and next year and then she should be able to get pregnant around September or October 2020  or later.  She feels that her MS symptoms are doing well.  She has no difficulty with her gait or balance.  She denies any weakness and rarely feels any numbness.  Region returned to normal.  She continues to note some anxiety and depression but is doing better.  She has some fatigue but this is not bothering her too much.  Update 05/19/2017: She reports more stress and anxiety.   She tried Zoloft but sleep was worse so she stopped.   She has not tried Buspar.    We discussed some changes in medications and if not effective seeing a psychologist or psychiatrist may be of benefit.  Her MS is doing well on Tysabri.  She tolerates it well and has had no exacerbation.    She notes no problems with gait, strength, sensation or bladder.    Vision has returned to normal  She notes some fatigue most days, not much different with heat.     This does not affect her much at work but she is more tired after work and sometimes skips social events.   She sleeps well in general (was worse with Zoloft).  From 01/06/2017 Mood:   She is having a lot more depression and anxiety, triggered (she thinks) by other friends moving on with their life and her having all these health issues (besides MS needed cervical LEEP procedure and close monitoring and multiple moles removed-one possibly a melanoma).  She also has a lot of stress at work.     She has frequent crying spells.   We had a long discussion about her mood, triggers, relationship to medical issues therapy, etc   MS:   She continues on Tysabri. She tolerates it well. She is JCV antibody negative (last tested July 2018). She has not had any further exacerbations. Vision is essentially back to normal and her gait and sensation are practically baseline per   From 11/16/2016  MS:   She started Tysabri in April 2018.   She is tolerating it well. She sometimes feels a little bit more tired the last week of each one month cycle. She was JCV antibody negative when  tested in February. We will retest today.  Gait/strength/sensation: She denies any significant problem with her gait or strength. She did have some twitching in the right biceps and deltoid shortly after diagnosis and before starting Tysabri and some slight numbness in the right hand at the same time. The symptoms lasted about a week and have not returned.  Vision: She feels her vision is back to normal now.   She had left  optic neuritis January 2018.  Bladder: She denies any difficulty with urinary frequency, urgency or hesitancy.  Mood/cognition:  She has not had any recent trouble with depression or anxiety. She had some mild depression and anxiety around the time of diagnosis but that has improved.   She denies any significant problems with cognitive function.  Fatigue/sleep:    She notes mild fatigue but it does not get in the way of her performing necessary activities of daily living. She sleeps well most nights.    She grinds her teeth at night and notes some TMJ-like pain on the right if she opens her mouth wide that preceded the current symptoms.   ENT:  She had severe otitis externa.   A combination of antibiotics and drops hekpled a lot.    MS History  Around 05/20/2016, she had the onset of left eye pain, worse with movement.   Symptoms started while driving and she noted looking to merge was painful. She saw her ophthalmologist and she had an MRI 06/03/2016 showing optic nerve enhancement on the left and a second focus in the right hemisphere that also enhanced.     She noted mild blurry vision shortly after that but it seemed worse 06/05/2016 and the next day, vision was worse.    [facility] and was admitted for 5 days of IV Solu-Medrol (3 days in hospital, 2 as outpatient).  There, she had an MRI of the cervical spine that showed 2 more lesions (at C4C5 and T1) consistent with MS.  In retrospect, a few weeks before the visual symptoms, she noted right hand numbness,  especially if she hold her phone which is persisting.     I have reviewed the MRI of the brain dated 06/03/2016 and the MRI of the cervical spine dated 06/08/2016.   The MRI of the brain showed enhancement of the left optic nerve. Additionally in the right temporal lobes there is another T2/FLAIR hyperintense focus that enhances. Additionally there is a periventricular right cerebellar focus that does not enhance. I also reviewed the MRI of the cervical spine. At C3-C4 there is a right lateral focus with subtle enhancement. There is also a right posterolateral focus adjacent to T1 that does not enhance.   Lab reports from her recent hospital stay were also evaluated. He is JCV antibody negative. Anti-NMO is negative.  She started Tysabri in March 2018.Marland Kitchen.      REVIEW OF SYSTEMS: Out of a complete 14 system review of symptoms, the patient complains only of the following symptoms, headache and all other reviewed systems are negative.  ALLERGIES: Allergies  Allergen Reactions   Hydrocortisone-Acetic Acid Other (See Comments)    Severe pain and burning Severe pain and burning     HOME MEDICATIONS: Outpatient Medications Prior to Visit  Medication Sig Dispense Refill   Cholecalciferol (VITAMIN D3) 5000 units CAPS Take by mouth.     Glatiramer Acetate (COPAXONE) 40 MG/ML SOSY Inject into the skin.     lactobacillus acidophilus (BACID) TABS tablet Take 2 tablets by mouth 3 (three) times daily.     Prenatal w/o A Vit-Fe Cbn-FA (PRENATABS OBN PO) Prenatabs Rx 29 mg iron-1 mg tablet     UNABLE TO FIND Methylcobalamin 1 mg daily     UNABLE TO FIND Take 1 Dose by mouth daily. Med Name: CYPRESS PRENATABS     zinc gluconate 50 MG tablet Take 50 mg by mouth.     amphetamine-dextroamphetamine (ADDERALL XR) 15  MG 24 hr capsule Take 1 capsule by mouth every morning. 30 capsule 0   No facility-administered medications prior to visit.     PAST MEDICAL HISTORY: Past Medical History:  Diagnosis Date    Vision abnormalities     PAST SURGICAL HISTORY: Past Surgical History:  Procedure Laterality Date   LEEP     suppose to have follow up of cervical cells in January    FAMILY HISTORY: Family History  Problem Relation Age of Onset   Heart disease Mother    Healthy Father     SOCIAL HISTORY: Social History   Socioeconomic History   Marital status: Married    Spouse name: Not on file   Number of children: Not on file   Years of education: Not on file   Highest education level: Not on file  Occupational History   Not on file  Social Needs   Financial resource strain: Not on file   Food insecurity    Worry: Not on file    Inability: Not on file   Transportation needs    Medical: Not on file    Non-medical: Not on file  Tobacco Use   Smoking status: Never Smoker   Smokeless tobacco: Never Used  Substance and Sexual Activity   Alcohol use: Yes    Comment: occasional   Drug use: No   Sexual activity: Not on file  Lifestyle   Physical activity    Days per week: Not on file    Minutes per session: Not on file   Stress: Not on file  Relationships   Social connections    Talks on phone: Not on file    Gets together: Not on file    Attends religious service: Not on file    Active member of club or organization: Not on file    Attends meetings of clubs or organizations: Not on file    Relationship status: Not on file   Intimate partner violence    Fear of current or ex partner: Not on file    Emotionally abused: Not on file    Physically abused: Not on file    Forced sexual activity: Not on file  Other Topics Concern   Not on file  Social History Narrative   Not on file      PHYSICAL EXAM  Vitals:   02/12/19 0802  BP: 115/71  Pulse: 96  Temp: 97.7 F (36.5 C)  Weight: 180 lb (81.6 kg)  Height: 5' 4.5" (1.638 m)   Body mass index is 30.42 kg/m.  Generalized: Well developed, in no acute distress  Neurological examination    Mentation: Alert oriented to time, place, history taking. Follows all commands speech and language fluent Cranial nerve II-XII: Pupils were equal round reactive to light. Extraocular movements were full, visual field were full on confrontational test. Facial sensation and strength were normal. Uvula tongue midline. Head turning and shoulder shrug  were normal and symmetric. No vision loss or pain with exam.  Motor: The motor testing reveals 5 over 5 strength of all 4 extremities. Good symmetric motor tone is noted throughout.  Sensory: Sensory testing is intact to soft touch on all 4 extremities. No evidence of extinction is noted.  Coordination: Cerebellar testing reveals good finger-nose-finger and heel-to-shin bilaterally.  Gait and station: Gait is normal.   DIAGNOSTIC DATA (LABS, IMAGING, TESTING) - I reviewed patient records, labs, notes, testing and imaging myself where available.  No flowsheet data found.   Lab  Results  Component Value Date   WBC 7.5 06/30/2018   HGB 12.8 06/30/2018   HCT 38.5 06/30/2018   MCV 90 06/30/2018   PLT 279 06/30/2018      Component Value Date/Time   NA 141 09/19/2017 0917   K 4.5 09/19/2017 0917   CL 103 09/19/2017 0917   CO2 24 09/19/2017 0917   GLUCOSE 80 09/19/2017 0917   GLUCOSE 112 (H) 03/27/2016 2054   BUN 9 09/19/2017 0917   CREATININE 0.57 09/19/2017 0917   CALCIUM 9.2 09/19/2017 0917   PROT 6.6 09/19/2017 0917   ALBUMIN 4.4 09/19/2017 0917   AST 15 09/19/2017 0917   ALT 13 09/19/2017 0917   ALKPHOS 67 09/19/2017 0917   BILITOT 0.3 09/19/2017 0917   GFRNONAA 125 09/19/2017 0917   GFRAA 144 09/19/2017 0917   No results found for: CHOL, HDL, LDLCALC, LDLDIRECT, TRIG, CHOLHDL No results found for: ZOXW9UHGBA1C No results found for: VITAMINB12 Lab Results  Component Value Date   TSH 1.480 06/28/2016       ASSESSMENT AND PLAN 33 y.o. year old female  has a past medical history of Vision abnormalities. here with     ICD-10-CM    1. Multiple sclerosis (HCC)  G35 CBC with Differential/Platelets    CMP  2. Pregnancy, unspecified gestational age  39Z34.90   3. Chronic migraine without aura without status migrainosus, not intractable  G43.709     Is doing fairly well on Copaxone injections.Brittany Johnston she was advised to trial hydrocortisone cream topically to injection site prior to administration.  This will help with local site reaction.  We have discussed warnings of anaphylaxis and she verbalizes understanding to seek medical attention immediately should she have any respiratory distress, swelling or trouble swallowing.  We will plan to transition her back to Tysabri postdelivery in January.  Labs updated today.  She can continue Tylenol over-the-counter as directed by packaging for headaches.  Fioricet as advised by OB.  She was advised to avoid regular use of either medication.  She will have ophthalmology fax us the report after her visit today.  She will follow-up with Dr. Purvis SheffieldSager in 4 months, sooner if needed.  She verbalizes understanding and agreement with this plan.   Orders Placed This Encounter  Procedures   CBC with Differential/Platelets   CMP     No orders of the defined types were placed in this encounter.     I spent 30 minutes with the patient. 50% of this time was spent counseling and educating patient on plan of care and medications.    Shawnie Dappermy Brittany Cunanan, FNP-C 02/12/2019, 8:56 AM [facility] [address], [address] [place], [place] [address] [phone]

## 2019-02-11 ENCOUNTER — Telehealth: Payer: Self-pay | Admitting: Neurology

## 2019-02-11 NOTE — Telephone Encounter (Signed)
I called the patient.  The patient has had onset of pain on the right over the last 3 days.  She is now in the emergency room for an evaluation.  She has not noted any change in vision, if anything, the vision is more clear in the right eye than it is on the left where she had prior optic neuritis.  She does not have increased pain when moving the eyes, the pain is there all the time.  The patient reports no color washout or fogginess of vision in the right eye.  She does note that the right eye appears to be irritated, watering, she has developed some closing of the eye or ptosis, unclear whether the pupils are equal or not.   I have indicated that this does not sound like an optic neuritis.  The patient may have a conjunctivitis or some other irritating issue.  She denies a pre-existing history of migraine headache.  This type of pain is unusual for her.  She does have a history of multiple sclerosis, she was recently converted from to side effects of Copaxone as she has wanted to get pregnant.

## 2019-02-12 ENCOUNTER — Encounter: Payer: Self-pay | Admitting: Family Medicine

## 2019-02-12 ENCOUNTER — Other Ambulatory Visit: Payer: Self-pay

## 2019-02-12 ENCOUNTER — Ambulatory Visit (INDEPENDENT_AMBULATORY_CARE_PROVIDER_SITE_OTHER): Payer: BC Managed Care – PPO | Admitting: Family Medicine

## 2019-02-12 VITALS — BP 115/71 | HR 96 | Temp 97.7°F | Ht 64.5 in | Wt 180.0 lb

## 2019-02-12 DIAGNOSIS — Z3490 Encounter for supervision of normal pregnancy, unspecified, unspecified trimester: Secondary | ICD-10-CM

## 2019-02-12 DIAGNOSIS — G35 Multiple sclerosis: Secondary | ICD-10-CM

## 2019-02-12 DIAGNOSIS — G43709 Chronic migraine without aura, not intractable, without status migrainosus: Secondary | ICD-10-CM

## 2019-02-12 NOTE — Progress Notes (Signed)
I have read the note, and I agree with the clinical assessment and plan.  Briella Hobday A. Aspyn Warnke, MD, PhD, FAAN Certified in Neurology, Clinical Neurophysiology, Sleep Medicine, Pain Medicine and Neuroimaging  [facility] [address], [address] [place], [place] [address] [phone]  

## 2019-02-12 NOTE — Telephone Encounter (Signed)
I spoke with her and she is doing better.  Most likely migraine related.  She had an appointment with Amy Lomax this morning and I will review that note.

## 2019-02-13 ENCOUNTER — Telehealth: Payer: Self-pay | Admitting: *Deleted

## 2019-02-13 LAB — COMPREHENSIVE METABOLIC PANEL
ALT: 12 IU/L (ref 0–32)
AST: 15 IU/L (ref 0–40)
Albumin/Globulin Ratio: 1.5 (ref 1.2–2.2)
Albumin: 3.5 g/dL — ABNORMAL LOW (ref 3.8–4.8)
Alkaline Phosphatase: 87 IU/L (ref 39–117)
BUN/Creatinine Ratio: 14 (ref 9–23)
BUN: 7 mg/dL (ref 6–20)
Bilirubin Total: <0.2 mg/dL (ref 0.0–1.2)
CO2: 22 mmol/L (ref 20–29)
Calcium: 8.7 mg/dL (ref 8.7–10.2)
Chloride: 102 mmol/L (ref 96–106)
Creatinine, Ser: 0.51 mg/dL — ABNORMAL LOW (ref 0.57–1.00)
GFR calc Af Amer: 148 mL/min/{1.73_m2} (ref >59)
GFR calc non Af Amer: 129 mL/min/{1.73_m2} (ref >59)
Globulin, Total: 2.4 g/dL (ref 1.5–4.5)
Glucose: 110 mg/dL — ABNORMAL HIGH (ref 65–99)
Potassium: 4.2 mmol/L (ref 3.5–5.2)
Sodium: 137 mmol/L (ref 134–144)
Total Protein: 5.9 g/dL — ABNORMAL LOW (ref 6.0–8.5)

## 2019-02-13 LAB — CBC WITH DIFFERENTIAL/PLATELET
Basophils Absolute: 0 10*3/uL (ref 0.0–0.2)
Basos: 0 %
EOS (ABSOLUTE): 0.1 10*3/uL (ref 0.0–0.4)
Eos: 1 %
Hematocrit: 35.9 % (ref 34.0–46.6)
Hemoglobin: 11.4 g/dL (ref 11.1–15.9)
Immature Grans (Abs): 0 10*3/uL (ref 0.0–0.1)
Immature Granulocytes: 0 %
Lymphocytes Absolute: 1.9 10*3/uL (ref 0.7–3.1)
Lymphs: 20 %
MCH: 29.9 pg (ref 26.6–33.0)
MCHC: 31.8 g/dL (ref 31.5–35.7)
MCV: 94 fL (ref 79–97)
Monocytes Absolute: 0.5 10*3/uL (ref 0.1–0.9)
Monocytes: 5 %
Neutrophils Absolute: 7.1 10*3/uL — ABNORMAL HIGH (ref 1.4–7.0)
Neutrophils: 74 %
Platelets: 250 10*3/uL (ref 150–450)
RBC: 3.81 x10E6/uL (ref 3.77–5.28)
RDW: 12.5 % (ref 11.7–15.4)
WBC: 9.6 10*3/uL (ref 3.4–10.8)

## 2019-02-13 NOTE — Telephone Encounter (Signed)
Spoke with patient and informed her labs look okay. Her glucose was a little high but could have been if she had just eaten. If she had not eaten, she may need to check with OB for gestational diabetes testing. She stated she had eaten breakfast right before her appt. She does have glucose test with OB coming up. Patient verbalized understanding, appreciation.

## 2019-02-26 ENCOUNTER — Ambulatory Visit: Payer: Self-pay | Admitting: Family Medicine

## 2019-04-16 ENCOUNTER — Inpatient Hospital Stay (HOSPITAL_COMMUNITY)
Admission: AD | Admit: 2019-04-16 | Discharge: 2019-04-16 | Disposition: A | Discharge status: Home / Self Care | Payer: BC Managed Care – PPO | Attending: Obstetrics and Gynecology | Admitting: Obstetrics and Gynecology

## 2019-04-16 ENCOUNTER — Encounter (HOSPITAL_COMMUNITY): Payer: Self-pay | Admitting: Obstetrics and Gynecology

## 2019-04-16 ENCOUNTER — Other Ambulatory Visit: Payer: Self-pay

## 2019-04-16 DIAGNOSIS — R102 Pelvic and perineal pain: Secondary | ICD-10-CM | POA: Insufficient documentation

## 2019-04-16 DIAGNOSIS — Z3689 Encounter for other specified antenatal screening: Secondary | ICD-10-CM

## 2019-04-16 DIAGNOSIS — O26899 Other specified pregnancy related conditions, unspecified trimester: Secondary | ICD-10-CM

## 2019-04-16 DIAGNOSIS — G35 Multiple sclerosis: Secondary | ICD-10-CM | POA: Insufficient documentation

## 2019-04-16 DIAGNOSIS — Z3A34 34 weeks gestation of pregnancy: Secondary | ICD-10-CM | POA: Insufficient documentation

## 2019-04-16 DIAGNOSIS — O99353 Diseases of the nervous system complicating pregnancy, third trimester: Secondary | ICD-10-CM | POA: Insufficient documentation

## 2019-04-16 DIAGNOSIS — O99891 Other specified diseases and conditions complicating pregnancy: Secondary | ICD-10-CM | POA: Diagnosis present

## 2019-04-16 DIAGNOSIS — N898 Other specified noninflammatory disorders of vagina: Secondary | ICD-10-CM | POA: Diagnosis not present

## 2019-04-16 DIAGNOSIS — O26893 Other specified pregnancy related conditions, third trimester: Secondary | ICD-10-CM

## 2019-04-16 DIAGNOSIS — Z79899 Other long term (current) drug therapy: Secondary | ICD-10-CM | POA: Insufficient documentation

## 2019-04-16 HISTORY — DX: Multiple sclerosis, unspecified: G35.D

## 2019-04-16 HISTORY — DX: Multiple sclerosis: G35

## 2019-04-16 LAB — WET PREP, GENITAL
Sperm: NONE SEEN
Trich, Wet Prep: NONE SEEN
Yeast Wet Prep HPF POC: NONE SEEN

## 2019-04-16 LAB — URINALYSIS, ROUTINE W REFLEX MICROSCOPIC
Bilirubin Urine: NEGATIVE
Glucose, UA: NEGATIVE mg/dL
Hgb urine dipstick: NEGATIVE
Ketones, ur: NEGATIVE mg/dL
Leukocytes,Ua: NEGATIVE
Nitrite: NEGATIVE
Protein, ur: NEGATIVE mg/dL
Specific Gravity, Urine: 1.008 (ref 1.005–1.030)
pH: 7 (ref 5.0–8.0)

## 2019-04-16 NOTE — Discharge Instructions (Signed)
Musculoskeletal Pain Musculoskeletal pain refers to aches and pains in your bones, joints, muscles, and the tissues that surround them. This pain can occur in any part of the body. It can last for a short time (acute) or a long time (chronic). A physical exam, lab tests, and imaging studies may be done to find the cause of your musculoskeletal pain. Follow these instructions at home:  Lifestyle  Try to control or lower your stress levels. Stress increases muscle tension and can worsen musculoskeletal pain. It is important to recognize when you are anxious or stressed and learn ways to manage it. This may include: ? Meditation or yoga. ? Cognitive or behavioral therapy. ? Acupuncture or massage therapy.  You may continue all activities unless the activities cause more pain. When the pain gets better, slowly resume your normal activities. Gradually increase the intensity and duration of your activities or exercise. Managing pain, stiffness, and swelling  Take over-the-counter and prescription medicines only as told by your health care provider.  When your pain is severe, bed rest may be helpful. Lie or sit in any position that is comfortable, but get out of bed and walk around at least every couple of hours.  If directed, apply heat to the affected area as often as told by your health care provider. Use the heat source that your health care provider recommends, such as a moist heat pack or a heating pad. ? Place a towel between your skin and the heat source. ? Leave the heat on for 20-30 minutes. ? Remove the heat if your skin turns bright red. This is especially important if you are unable to feel pain, heat, or cold. You may have a greater risk of getting burned.  If directed, put ice on the painful area. ? Put ice in a plastic bag. ? Place a towel between your skin and the bag. ? Leave the ice on for 20 minutes, 2-3 times a day. General instructions  Your health care provider may  recommend that you see a physical therapist. This person can help you come up with a safe exercise program. Do any exercises as told by your physical therapist.  Keep all follow-up visits, including any physical therapy visits, as told by your health care providers. This is important. Contact a health care provider if:  Your pain gets worse.  Medicines do not help ease your pain.  You cannot use the part of your body that hurts, such as your arm, leg, or neck.  You have trouble sleeping.  You have trouble doing your normal activities. Get help right away if:  You have a new injury and your pain is worse or different.  You feel numb or you have tingling in the painful area. Summary  Musculoskeletal pain refers to aches and pains in your bones, joints, muscles, and the tissues that surround them.  This pain can occur in any part of the body.  Your health care provider may recommend that you see a physical therapist. This person can help you come up with a safe exercise program. Do any exercises as told by your physical therapist.  Lower your stress level. Stress can worsen musculoskeletal pain. Ways to lower stress may include meditation, yoga, cognitive or behavioral therapy, acupuncture, and massage therapy. This information is not intended to replace advice given to you by your health care provider. Make sure you discuss any questions you have with your health care provider. Document Released: 04/19/2005 Document Revised: 04/01/2017 Document Reviewed:   05/19/2016 Elsevier Patient Education  2020 [employer]  

## 2019-04-16 NOTE — MAU Note (Signed)
Patient presents to MAU c/o sharp pelvic pain after moving a piece of furniture. Patient states " I think I just pulled a muscle." Denies vaginal bleeding. +FM, reports increased discharge yellowish in color.

## 2019-04-16 NOTE — MAU Provider Note (Signed)
History     CSN: [id number]  Arrival date and time: 04/16/19 1900   First Provider Initiated Contact with Patient 04/16/19 2041      Chief Complaint  Patient presents with  . Pelvic Pain   Brittany Johnston is a 33 y.o. G1P0 at [redacted]w[redacted]d who receives care at [facility].  She presents today for .  Patient states she pulled out her ottoman and experienced a shooting sharp pain in her pelvic area.  She states she thought she may have pulled a muscle, but when she called the "on-call nurse" she was told to come in to make sure everything was okay.  She states the pain has improved despite no medication as it was initially a 10/10, but is now a 1/10.  Patient endorses fetal movement and reports BH contractions.  She denies vaginal bleeding or leaking, but reports discharge that is yellow and mucoid in nature.       OB History    Gravida  1   Para      Term      Preterm      AB      Living        SAB      TAB      Ectopic      Multiple      Live Births              Past Medical History:  Diagnosis Date  . Multiple sclerosis ([place])   . Vision abnormalities     Past Surgical History:  Procedure Laterality Date  . LEEP     suppose to have follow up of cervical cells in January    Family History  Problem Relation Age of Onset  . Heart disease Mother   . Healthy Father     Social History   Tobacco Use  . Smoking status: Never Smoker  . Smokeless tobacco: Never Used  Substance Use Topics  . Alcohol use: Yes    Comment: occasional  . Drug use: No    Allergies: No Known Allergies  Medications Prior to Admission  Medication Sig Dispense Refill Last Dose  . Cholecalciferol (VITAMIN D3) 5000 units CAPS Take by mouth.   04/16/2019 at Unknown time  . Glatiramer Acetate (COPAXONE) 40 MG/ML SOSY Inject into the skin.   04/16/2019 at Unknown time  . Prenatal w/o A Vit-Fe Cbn-FA (PRENATABS OBN PO) Prenatabs Rx 29 mg iron-1 mg tablet   04/16/2019 at  Unknown time  . lactobacillus acidophilus (BACID) TABS tablet Take 2 tablets by mouth 3 (three) times daily.     Marland Kitchen UNABLE TO FIND Methylcobalamin 1 mg daily     . UNABLE TO FIND Take 1 Dose by mouth daily. Med Name: CYPRESS PRENATABS     . zinc gluconate 50 MG tablet Take 50 mg by mouth.       Review of Systems  Constitutional: Negative for chills and fever.  Respiratory: Negative for cough and shortness of breath.   Gastrointestinal: Negative for abdominal pain, constipation, diarrhea, nausea and vomiting.  Genitourinary: Positive for pelvic pain and vaginal discharge. Negative for difficulty urinating, dysuria and vaginal bleeding.  Musculoskeletal: Positive for back pain. Gait problem: Ongoing.  Neurological: Negative for dizziness, light-headedness and headaches.   Physical Exam   Blood pressure 119/70, pulse 79, temperature 97.9 F (36.6 C), temperature source Oral, resp. rate 17, height 5\' 4"  (1.626 m), weight 85.7 kg, last menstrual period 08/16/2018, SpO2 100 %.  Physical Exam  Constitutional: She is oriented to person, place, and time. She appears well-developed and well-nourished.  HENT:  Head: Normocephalic and atraumatic.  Eyes: Conjunctivae are normal.  Cardiovascular: Normal rate, regular rhythm and normal heart sounds.  Respiratory: Effort normal and breath sounds normal.  GI: Soft.  Genitourinary:    Genitourinary Comments: Speculum Exam: -Normal External Genitalia: Non tender, Small amt milky white discharge at introitus. Wet Prep and GC/CT collected by blind swab. -Bimanual Exam:  Closed/Soft   Musculoskeletal:        General: Normal range of motion.     Cervical back: Normal range of motion.  Neurological: She is alert and oriented to person, place, and time.  Skin: Skin is warm and dry.  Psychiatric: She has a normal mood and affect. Her behavior is normal.    Fetal Assessment 135 bpm, Mod Var, -Decels, +Accels Toco: Occasional  MAU Course   Results  for orders placed or performed during the hospital encounter of 04/16/19 (from the past 24 hour(s))  Urinalysis, Routine w reflex microscopic     Status: None   Collection Time: 04/16/19  8:10 PM  Result Value Ref Range   Color, Urine YELLOW YELLOW   APPearance CLEAR CLEAR   Specific Gravity, Urine 1.008 1.005 - 1.030   pH 7.0 5.0 - 8.0   Glucose, UA NEGATIVE NEGATIVE mg/dL   Hgb urine dipstick NEGATIVE NEGATIVE   Bilirubin Urine NEGATIVE NEGATIVE   Ketones, ur NEGATIVE NEGATIVE mg/dL   Protein, ur NEGATIVE NEGATIVE mg/dL   Nitrite NEGATIVE NEGATIVE   Leukocytes,Ua NEGATIVE NEGATIVE   No results found.  MDM PE Labs: Wet Prep, GC/CT EFM  Assessment and Plan  33 year old G1P0  SIUP at 34.5weeks Cat I FT Pelvic Pain-Improved Vaginal Discharge  -Provider offers to test vaginal discharge and patient accepts. -Discussed that formal pelvic exam would be deferred as patient symptoms are directly related to moving furniture. -Cervical Exam findings discussed. -Patient offered and declines pain medication.  -Instructed to monitor symptoms at home.  Informed okay to take tylenol.  Encouraged rest and hydration tonight and tomorrow. -Patient desires discharge and for test results to be sent via mychart. -Informed that if any findings that require treatment, the information and script would be sent. -Patient without questions or concerns. -NST Reactive -Keep appt for Thursday with primary ob provider -Encouraged to call, primary ob, or return to MAU if symptoms worsen or with the onset of new symptoms. -Discharged to home in stable condition.  Cherre Robins MSN, CNM 04/16/2019, 8:41 PM

## 2019-04-17 LAB — GC/CHLAMYDIA PROBE AMP (~~LOC~~) NOT AT ARMC
Chlamydia: NEGATIVE
Comment: NEGATIVE
Comment: NORMAL
Neisseria Gonorrhea: NEGATIVE

## 2019-04-24 LAB — OB RESULTS CONSOLE GBS: GBS: NEGATIVE

## 2019-04-27 ENCOUNTER — Inpatient Hospital Stay (HOSPITAL_COMMUNITY)
Admission: AD | Admit: 2019-04-27 | Discharge: 2019-04-27 | Disposition: A | Discharge status: Home / Self Care | Payer: BC Managed Care – PPO | Attending: Obstetrics and Gynecology | Admitting: Obstetrics and Gynecology

## 2019-04-27 ENCOUNTER — Other Ambulatory Visit: Payer: Self-pay

## 2019-04-27 ENCOUNTER — Encounter (HOSPITAL_COMMUNITY): Payer: Self-pay | Admitting: Obstetrics and Gynecology

## 2019-04-27 DIAGNOSIS — O479 False labor, unspecified: Secondary | ICD-10-CM

## 2019-04-27 DIAGNOSIS — Z3A26 26 weeks gestation of pregnancy: Secondary | ICD-10-CM

## 2019-04-27 DIAGNOSIS — R109 Unspecified abdominal pain: Secondary | ICD-10-CM

## 2019-04-27 DIAGNOSIS — O133 Gestational [pregnancy-induced] hypertension without significant proteinuria, third trimester: Secondary | ICD-10-CM | POA: Diagnosis not present

## 2019-04-27 DIAGNOSIS — Z0371 Encounter for suspected problem with amniotic cavity and membrane ruled out: Secondary | ICD-10-CM | POA: Diagnosis not present

## 2019-04-27 DIAGNOSIS — O26893 Other specified pregnancy related conditions, third trimester: Secondary | ICD-10-CM | POA: Diagnosis not present

## 2019-04-27 DIAGNOSIS — O99891 Other specified diseases and conditions complicating pregnancy: Secondary | ICD-10-CM | POA: Diagnosis not present

## 2019-04-27 DIAGNOSIS — O4703 False labor before 37 completed weeks of gestation, third trimester: Secondary | ICD-10-CM | POA: Insufficient documentation

## 2019-04-27 DIAGNOSIS — Z3A36 36 weeks gestation of pregnancy: Secondary | ICD-10-CM | POA: Insufficient documentation

## 2019-04-27 DIAGNOSIS — B373 Candidiasis of vulva and vagina: Secondary | ICD-10-CM | POA: Insufficient documentation

## 2019-04-27 DIAGNOSIS — O23593 Infection of other part of genital tract in pregnancy, third trimester: Secondary | ICD-10-CM | POA: Insufficient documentation

## 2019-04-27 DIAGNOSIS — B3731 Acute candidiasis of vulva and vagina: Secondary | ICD-10-CM

## 2019-04-27 LAB — CBC WITH DIFFERENTIAL/PLATELET
Abs Immature Granulocytes: 0.07 10*3/uL (ref 0.00–0.07)
Basophils Absolute: 0 10*3/uL (ref 0.0–0.1)
Basophils Relative: 0 %
Eosinophils Absolute: 0.2 10*3/uL (ref 0.0–0.5)
Eosinophils Relative: 2 %
HCT: 32.1 % — ABNORMAL LOW (ref 36.0–46.0)
Hemoglobin: 10.9 g/dL — ABNORMAL LOW (ref 12.0–15.0)
Immature Granulocytes: 1 %
Lymphocytes Relative: 17 %
Lymphs Abs: 2.2 10*3/uL (ref 0.7–4.0)
MCH: 30.6 pg (ref 26.0–34.0)
MCHC: 34 g/dL (ref 30.0–36.0)
MCV: 90.2 fL (ref 80.0–100.0)
Monocytes Absolute: 1 10*3/uL (ref 0.1–1.0)
Monocytes Relative: 7 %
Neutro Abs: 9.5 10*3/uL — ABNORMAL HIGH (ref 1.7–7.7)
Neutrophils Relative %: 73 %
Platelets: 261 10*3/uL (ref 150–400)
RBC: 3.56 MIL/uL — ABNORMAL LOW (ref 3.87–5.11)
RDW: 13.5 % (ref 11.5–15.5)
WBC: 12.9 10*3/uL — ABNORMAL HIGH (ref 4.0–10.5)
nRBC: 0 % (ref 0.0–0.2)

## 2019-04-27 LAB — COMPREHENSIVE METABOLIC PANEL
ALT: 19 U/L (ref 0–44)
AST: 19 U/L (ref 15–41)
Albumin: 2.7 g/dL — ABNORMAL LOW (ref 3.5–5.0)
Alkaline Phosphatase: 117 U/L (ref 38–126)
Anion gap: 11 (ref 5–15)
BUN: 5 mg/dL — ABNORMAL LOW (ref 6–20)
CO2: 23 mmol/L (ref 22–32)
Calcium: 9 mg/dL (ref 8.9–10.3)
Chloride: 104 mmol/L (ref 98–111)
Creatinine, Ser: 0.54 mg/dL (ref 0.44–1.00)
GFR calc Af Amer: >60 mL/min (ref >60)
GFR calc non Af Amer: >60 mL/min (ref >60)
Glucose, Bld: 73 mg/dL (ref 70–99)
Potassium: 3.8 mmol/L (ref 3.5–5.1)
Sodium: 138 mmol/L (ref 135–145)
Total Bilirubin: 0.5 mg/dL (ref 0.3–1.2)
Total Protein: 5.7 g/dL — ABNORMAL LOW (ref 6.5–8.1)

## 2019-04-27 LAB — URINALYSIS, ROUTINE W REFLEX MICROSCOPIC
Bilirubin Urine: NEGATIVE
Glucose, UA: NEGATIVE mg/dL
Hgb urine dipstick: NEGATIVE
Ketones, ur: NEGATIVE mg/dL
Leukocytes,Ua: NEGATIVE
Nitrite: NEGATIVE
Protein, ur: NEGATIVE mg/dL
Specific Gravity, Urine: 1.008 (ref 1.005–1.030)
pH: 8 (ref 5.0–8.0)

## 2019-04-27 LAB — WET PREP, GENITAL
Clue Cells Wet Prep HPF POC: NONE SEEN
Sperm: NONE SEEN
Trich, Wet Prep: NONE SEEN
Yeast Wet Prep HPF POC: NONE SEEN

## 2019-04-27 LAB — PROTEIN / CREATININE RATIO, URINE
Creatinine, Urine: 44.23 mg/dL
Protein Creatinine Ratio: 0.16 mg/mg{Cre} — ABNORMAL HIGH (ref 0.00–0.15)
Total Protein, Urine: 7 mg/dL

## 2019-04-27 MED ORDER — TERCONAZOLE 0.4 % VA CREA: 1.0000 | TOPICAL_CREAM | Freq: Every day | VAGINAL | 0 refills | Status: DC

## 2019-04-27 NOTE — MAU Note (Addendum)
Pt is G1P0 who presents witt clear liquid discharge since Wednesday for which she's wearing a panty liner. Thought the discharge was from [place]. She has also has been having intermittent BH contractions for a few weeks. Today she reports new onset  tenderness on rt midside of abdomen. Denies VB. +FM. She was closed on Tuesday.

## 2019-04-27 NOTE — MAU Provider Note (Signed)
Chief Complaint  Patient presents with  . Rupture of Membranes  . Contractions     First Provider Initiated Contact with Patient 04/27/19 1747      S: Brittany Johnston  is a 33 y.o. y.o. year old G1P0 female at [redacted]w[redacted]d weeks gestation who presents to MAU contractions, leaking small amount of thin, fluid, tenderness right of umbilicus and incidental finding of elevated BP. No Hx HTN. Current blood pressure medication: None.   Associated symptoms: Denies Headache, vision changes, epigastric pain. Vomting once yesterday. Nml appetite. Contractions: Mild, irreg Vaginal bleeding: Denies Fetal movement: Nml  O:  Patient Vitals for the past 24 hrs:  BP Temp Temp src Pulse Resp SpO2 Height Weight  04/27/19 1901 116/74 -- -- 94 -- -- -- --  04/27/19 1846 114/72 -- -- 91 -- -- -- --  04/27/19 1831 120/77 -- -- 92 -- -- -- --  04/27/19 1816 124/76 -- -- 98 -- -- -- --  04/27/19 1811 126/83 -- -- 94 -- -- -- --  04/27/19 1745 -- -- -- -- -- 100 % -- --  04/27/19 1741 116/67 -- -- 97 -- -- -- --  04/27/19 1740 -- -- -- -- -- 99 % -- --  04/27/19 1719 (!) 141/75 97.9 F (36.6 C) Oral 99 17 -- -- --  04/27/19 1710 -- -- -- -- -- -- 5' 4.5" (1.638 m) 89 kg   General: NAD. Very anxious.  Heart: Regular rate Lungs: Normal rate and effort Abd: Soft, Gravid, S=D. Mild TTP few inches right of umbilicus. No rebound tenderness, mass. Extremities: No Pedal edema Neuro: 2+ deep tendon reflexes, No clonus Pelvic: NEFG, no bleeding or pooling. Moderate amount of this, white, odorless discharge C/W yeast infection.   Dilation: Closed Cervical Position: Posterior Exam by:: Ivonne Andrew, CNM  EFM: 135, Moderate variability, 15 x 15 accelerations, no decelerations Toco: Uterine irritability  Results for orders placed or performed during the hospital encounter of 04/27/19 (from the past 24 hour(s))  Urinalysis, Routine w reflex microscopic     Status: Abnormal   Collection Time: 04/27/19  5:31 PM  Result  Value Ref Range   Color, Urine YELLOW YELLOW   APPearance CLOUDY (A) CLEAR   Specific Gravity, Urine 1.008 1.005 - 1.030   pH 8.0 5.0 - 8.0   Glucose, UA NEGATIVE NEGATIVE mg/dL   Hgb urine dipstick NEGATIVE NEGATIVE   Bilirubin Urine NEGATIVE NEGATIVE   Ketones, ur NEGATIVE NEGATIVE mg/dL   Protein, ur NEGATIVE NEGATIVE mg/dL   Nitrite NEGATIVE NEGATIVE   Leukocytes,Ua NEGATIVE NEGATIVE  Wet prep, genital     Status: Abnormal   Collection Time: 04/27/19  5:40 PM   Specimen: Cervix; Genital  Result Value Ref Range   Yeast Wet Prep HPF POC NONE SEEN NONE SEEN   Trich, Wet Prep NONE SEEN NONE SEEN   Clue Cells Wet Prep HPF POC NONE SEEN NONE SEEN   WBC, Wet Prep HPF POC MANY (A) NONE SEEN   Sperm NONE SEEN   Protein / creatinine ratio, urine     Status: Abnormal   Collection Time: 04/27/19  5:45 PM  Result Value Ref Range   Creatinine, Urine 44.23 mg/dL   Total Protein, Urine 7 mg/dL   Protein Creatinine Ratio 0.16 (H) 0.00 - 0.15 mg/mg[Cre]  CBC with Differential     Status: Abnormal   Collection Time: 04/27/19  6:04 PM  Result Value Ref Range   WBC 12.9 (H) 4.0 - 10.5 K/uL  RBC 3.56 (L) 3.87 - 5.11 MIL/uL   Hemoglobin 10.9 (L) 12.0 - 15.0 g/dL   HCT 32.1 (L) 36.0 - 46.0 %   MCV 90.2 80.0 - 100.0 fL   MCH 30.6 26.0 - 34.0 pg   MCHC 34.0 30.0 - 36.0 g/dL   RDW 13.5 11.5 - 15.5 %   Platelets 261 150 - 400 K/uL   nRBC 0.0 0.0 - 0.2 %   Neutrophils Relative % 73 %   Neutro Abs 9.5 (H) 1.7 - 7.7 K/uL   Lymphocytes Relative 17 %   Lymphs Abs 2.2 0.7 - 4.0 K/uL   Monocytes Relative 7 %   Monocytes Absolute 1.0 0.1 - 1.0 K/uL   Eosinophils Relative 2 %   Eosinophils Absolute 0.2 0.0 - 0.5 K/uL   Basophils Relative 0 %   Basophils Absolute 0.0 0.0 - 0.1 K/uL   Immature Granulocytes 1 %   Abs Immature Granulocytes 0.07 0.00 - 0.07 K/uL  Comprehensive metabolic panel     Status: Abnormal   Collection Time: 04/27/19  6:04 PM  Result Value Ref Range   Sodium 138 135 - 145  mmol/L   Potassium 3.8 3.5 - 5.1 mmol/L   Chloride 104 98 - 111 mmol/L   CO2 23 22 - 32 mmol/L   Glucose, Bld 73 70 - 99 mg/dL   BUN 5 (L) 6 - 20 mg/dL   Creatinine, Ser 0.54 0.44 - 1.00 mg/dL   Calcium 9.0 8.9 - 10.3 mg/dL   Total Protein 5.7 (L) 6.5 - 8.1 g/dL   Albumin 2.7 (L) 3.5 - 5.0 g/dL   AST 19 15 - 41 U/L   ALT 19 0 - 44 U/L   Alkaline Phosphatase 117 38 - 126 U/L   Total Bilirubin 0.5 0.3 - 1.2 mg/dL   GFR calc non Af Amer >60 >60 mL/min   GFR calc Af Amer >60 >60 mL/min   Anion gap 11 5 - 15   MAU Course/MDM - Vaginal discharge due to vaginal yeast infection. No evidence of ROM. Neg fern and pool.   - Braxton hicks contractions. Cervix closed.   - Mild right abd tenderness likely muscular. Low suspicion for appendicitis due to location of pain and absence of fever and significant GI complaints. Mild leukocytosis C/W pregnant state. Appendicitis precautions reviewed.    - Elevated BP x 1 w/ Nml Pre-E labs and no Sx. F/U 12/29 as scheduled for ROB, BP check.   A: [redacted]w[redacted]d week IUP 1. No leakage of amniotic fluid into vagina   2. Transient hypertension of pregnancy in third trimester   3. Vaginal yeast infection   4. Abdominal pain during pregnancy in third trimester   5. Braxton Hicks contractions   FHR reactive  P: Discharge home in stable condition. Preeclampsia precautions. Follow-up for blood pressure check 12/29 at your doctor's office sooner as needed if symptoms worsen. Return to maternity admissions as needed in [place], [place], [place] 04/27/2019 7:33 PM

## 2019-04-27 NOTE — Discharge Instructions (Signed)
Braxton Hicks Contractions Contractions of the uterus can occur throughout pregnancy, but they are not always a sign that you are in labor. You may have practice contractions called Braxton Hicks contractions. These false labor contractions are sometimes confused with true labor. What are Braxton Hicks contractions? Braxton Hicks contractions are tightening movements that occur in the muscles of the uterus before labor. Unlike true labor contractions, these contractions do not result in opening (dilation) and thinning of the cervix. Toward the end of pregnancy (32-34 weeks), Braxton Hicks contractions can happen more often and may become stronger. These contractions are sometimes difficult to tell apart from true labor because they can be very uncomfortable. You should not feel embarrassed if you go to the hospital with false labor. Sometimes, the only way to tell if you are in true labor is for your health care provider to look for changes in the cervix. The health care provider will do a physical exam and may monitor your contractions. If you are not in true labor, the exam should show that your cervix is not dilating and your water has not broken. If there are no other health problems associated with your pregnancy, it is completely safe for you to be sent home with false labor. You may continue to have Braxton Hicks contractions until you go into true labor. How to tell the difference between true labor and false labor True labor  Contractions last 30-70 seconds.  Contractions become very regular.  Discomfort is usually felt in the top of the uterus, and it spreads to the lower abdomen and low back.  Contractions do not go away with walking.  Contractions usually become more intense and increase in frequency.  The cervix dilates and gets thinner. False labor  Contractions are usually shorter and not as strong as true labor contractions.  Contractions are usually irregular.  Contractions  are often felt in the front of the lower abdomen and in the groin.  Contractions may go away when you walk around or change positions while lying down.  Contractions get weaker and are shorter-lasting as time goes on.  The cervix usually does not dilate or become thin. Follow these instructions at home:   Take over-the-counter and prescription medicines only as told by your health care provider.  Keep up with your usual exercises and follow other instructions from your health care provider.  Eat and drink lightly if you think you are going into labor.  If Braxton Hicks contractions are making you uncomfortable: ? Change your position from lying down or resting to walking, or change from walking to resting. ? Sit and rest in a tub of warm water. ? Drink enough fluid to keep your urine pale yellow. Dehydration may cause these contractions. ? Do slow and deep breathing several times an hour.  Keep all follow-up prenatal visits as told by your health care provider. This is important. Contact a health care provider if:  You have a fever.  You have continuous pain in your abdomen. Get help right away if:  Your contractions become stronger, more regular, and closer together.  You have fluid leaking or gushing from your vagina.  You pass blood-tinged mucus (bloody show).  You have bleeding from your vagina.  You have low back pain that you never had before.  You feel your baby's head pushing down and causing pelvic pressure.  Your baby is not moving inside you as much as it used to. Summary  Contractions that occur before labor are   called Braxton Hicks contractions, false labor, or practice contractions.  Braxton Hicks contractions are usually shorter, weaker, farther apart, and less regular than true labor contractions. True labor contractions usually become progressively stronger and regular, and they become more frequent.  Manage discomfort from [facility] contractions  by changing position, resting in a warm bath, drinking plenty of water, or practicing deep breathing. This information is not intended to replace advice given to you by your health care provider. Make sure you discuss any questions you have with your health care provider. Document Released: 09/02/2016 Document Revised: 04/01/2017 Document Reviewed: 09/02/2016 Elsevier Patient Education  [employer].   Preeclampsia and Eclampsia Preeclampsia is a serious condition that may develop during pregnancy. This condition causes high blood pressure and increased protein in your urine along with other symptoms, such as headaches and vision changes. These symptoms may develop as the condition gets worse. Preeclampsia may occur at 20 weeks of pregnancy or later. Diagnosing and treating preeclampsia early is very important. If not treated early, it can cause serious problems for you and your baby. One problem it can lead to is eclampsia. Eclampsia is a condition that causes muscle jerking or shaking (convulsions or seizures) and other serious problems for the mother. During pregnancy, delivering your baby may be the best treatment for preeclampsia or eclampsia. For most women, preeclampsia and eclampsia symptoms go away after giving birth. In rare cases, a woman may develop preeclampsia after giving birth (postpartum preeclampsia). This usually occurs within 48 hours after childbirth but may occur up to 6 weeks after giving birth. What are the causes? The cause of preeclampsia is not known. What increases the risk? The following risk factors make you more likely to develop preeclampsia:  Being pregnant for the first time.  Having had preeclampsia during a past pregnancy.  Having a family history of preeclampsia.  Having high blood pressure.  Being pregnant with more than one baby.  Being 40 or older.  Being African-American.  Having kidney disease or diabetes.  Having medical conditions such  as lupus or blood diseases.  Being very overweight (obese). What are the signs or symptoms? The most common symptoms are:  Severe headaches.  Vision problems, such as blurred or double vision.  Abdominal pain, especially upper abdominal pain. Other symptoms that may develop as the condition gets worse include:  Sudden weight gain.  Sudden swelling of the hands, face, legs, and feet.  Severe nausea and vomiting.  Numbness in the face, arms, legs, and feet.  Dizziness.  Urinating less than usual.  Slurred speech.  Convulsions or seizures. How is this diagnosed? There are no screening tests for preeclampsia. Your health care provider will ask you about symptoms and check for signs of preeclampsia during your prenatal visits. You may also have tests that include:  Checking your blood pressure.  Urine tests to check for protein. Your health care provider will check for this at every prenatal visit.  Blood tests.  Monitoring your baby's heart rate.  Ultrasound. How is this treated? You and your health care provider will determine the treatment approach that is best for you. Treatment may include:  Having more frequent prenatal exams to check for signs of preeclampsia, if you have an increased risk for preeclampsia.  Medicine to lower your blood pressure.  Staying in the hospital, if your condition is severe. There, treatment will focus on controlling your blood pressure and the amount of fluids in your body (fluid retention).  Taking medicine (  magnesium sulfate) to prevent seizures. This may be given as an injection or through an IV.  Taking a low-dose aspirin during your pregnancy.  Delivering your baby early. You may have your labor started with medicine (induced), or you may have a cesarean delivery. Follow these instructions at home: Eating and drinking   Drink enough fluid to keep your urine pale yellow.  Avoid caffeine. Lifestyle  Do not use any products  that contain nicotine or tobacco, such as cigarettes and e-cigarettes. If you need help quitting, ask your health care provider.  Do not use alcohol or drugs.  Avoid stress as much as possible. Rest and get plenty of sleep. General instructions  Take over-the-counter and prescription medicines only as told by your health care provider.  When lying down, lie on your left side. This keeps pressure off your major blood vessels.  When sitting or lying down, raise (elevate) your feet. Try putting some pillows underneath your lower legs.  Exercise regularly. Ask your health care provider what kinds of exercise are best for you.  Keep all follow-up and prenatal visits as told by your health care provider. This is important. How is this prevented? There is no known way of preventing preeclampsia or eclampsia from developing. However, to lower your risk of complications and detect problems early:  Get regular prenatal care. Your health care provider may be able to diagnose and treat the condition early.  Maintain a healthy weight. Ask your health care provider for help managing weight gain during pregnancy.  Work with your health care provider to manage any long-term (chronic) health conditions you have, such as diabetes or kidney problems.  You may have tests of your blood pressure and kidney function after giving birth.  Your health care provider may have you take low-dose aspirin during your next pregnancy. Contact a health care provider if:  You have symptoms that your health care provider told you may require more treatment or monitoring, such as: ? Headaches. ? Nausea or vomiting. ? Abdominal pain. ? Dizziness. ? Light-headedness. Get help right away if:  You have severe: ? Abdominal pain. ? Headaches that do not get better. ? Dizziness. ? Vision problems. ? Confusion. ? Nausea or vomiting.  You have any of the following: ? A seizure. ? Sudden, rapid weight  gain. ? Sudden swelling in your hands, ankles, or face. ? Trouble moving any part of your body. ? Numbness in any part of your body. ? Trouble speaking. ? Abnormal bleeding.  You faint. Summary  Preeclampsia is a serious condition that may develop during pregnancy.  This condition causes high blood pressure and increased protein in your urine along with other symptoms, such as headaches and vision changes.  Diagnosing and treating preeclampsia early is very important. If not treated early, it can cause serious problems for you and your baby.  Get help right away if you have symptoms that your health care provider told you to watch for. This information is not intended to replace advice given to you by your health care provider. Make sure you discuss any questions you have with your health care provider. Document Released: 04/16/2000 Document Revised: 12/20/2017 Document Reviewed: 11/24/2015 Elsevier Patient Education  2020 [employer].

## 2019-05-15 ENCOUNTER — Telehealth (HOSPITAL_COMMUNITY): Payer: Self-pay | Admitting: *Deleted

## 2019-05-15 ENCOUNTER — Encounter (HOSPITAL_COMMUNITY): Payer: Self-pay | Admitting: *Deleted

## 2019-05-15 NOTE — Telephone Encounter (Signed)
Preadmission screen  

## 2019-05-16 ENCOUNTER — Telehealth (HOSPITAL_COMMUNITY): Payer: Self-pay | Admitting: *Deleted

## 2019-05-16 ENCOUNTER — Encounter (HOSPITAL_COMMUNITY): Payer: Self-pay | Admitting: *Deleted

## 2019-05-16 NOTE — Telephone Encounter (Signed)
Preadmission screen  

## 2019-05-21 ENCOUNTER — Inpatient Hospital Stay (HOSPITAL_COMMUNITY): Payer: No Typology Code available for payment source | Admitting: Anesthesiology

## 2019-05-21 ENCOUNTER — Inpatient Hospital Stay (HOSPITAL_COMMUNITY)
Admission: AD | Admit: 2019-05-21 | Discharge: 2019-05-24 | DRG: 787 | Disposition: A | Discharge status: Home / Self Care | Payer: No Typology Code available for payment source | Attending: Obstetrics and Gynecology | Admitting: Obstetrics and Gynecology

## 2019-05-21 ENCOUNTER — Encounter (HOSPITAL_COMMUNITY)
Admission: AD | Disposition: A | Discharge status: Home / Self Care | Payer: Self-pay | Source: Home / Self Care | Attending: Obstetrics and Gynecology

## 2019-05-21 ENCOUNTER — Other Ambulatory Visit: Payer: Self-pay

## 2019-05-21 ENCOUNTER — Encounter (HOSPITAL_COMMUNITY): Payer: Self-pay | Admitting: Obstetrics and Gynecology

## 2019-05-21 DIAGNOSIS — G35 Multiple sclerosis: Secondary | ICD-10-CM | POA: Diagnosis present

## 2019-05-21 DIAGNOSIS — Z3490 Encounter for supervision of normal pregnancy, unspecified, unspecified trimester: Secondary | ICD-10-CM

## 2019-05-21 DIAGNOSIS — O4292 Full-term premature rupture of membranes, unspecified as to length of time between rupture and onset of labor: Principal | ICD-10-CM | POA: Diagnosis present

## 2019-05-21 DIAGNOSIS — O99354 Diseases of the nervous system complicating childbirth: Secondary | ICD-10-CM | POA: Diagnosis present

## 2019-05-21 DIAGNOSIS — O9902 Anemia complicating childbirth: Secondary | ICD-10-CM | POA: Diagnosis present

## 2019-05-21 DIAGNOSIS — Z3A39 39 weeks gestation of pregnancy: Secondary | ICD-10-CM

## 2019-05-21 DIAGNOSIS — D649 Anemia, unspecified: Secondary | ICD-10-CM | POA: Diagnosis present

## 2019-05-21 DIAGNOSIS — R140 Abdominal distension (gaseous): Secondary | ICD-10-CM | POA: Diagnosis not present

## 2019-05-21 DIAGNOSIS — Z20822 Contact with and (suspected) exposure to covid-19: Secondary | ICD-10-CM | POA: Diagnosis present

## 2019-05-21 DIAGNOSIS — M955 Acquired deformity of pelvis: Secondary | ICD-10-CM | POA: Diagnosis present

## 2019-05-21 LAB — CBC
HCT: 38.5 % (ref 36.0–46.0)
Hemoglobin: 12.8 g/dL (ref 12.0–15.0)
MCH: 30.2 pg (ref 26.0–34.0)
MCHC: 33.2 g/dL (ref 30.0–36.0)
MCV: 90.8 fL (ref 80.0–100.0)
Platelets: 306 10*3/uL (ref 150–400)
RBC: 4.24 MIL/uL (ref 3.87–5.11)
RDW: 13.1 % (ref 11.5–15.5)
WBC: 16.1 10*3/uL — ABNORMAL HIGH (ref 4.0–10.5)
nRBC: 0 % (ref 0.0–0.2)

## 2019-05-21 LAB — SARS CORONAVIRUS 2 (TAT 6-24 HRS): SARS Coronavirus 2: NEGATIVE

## 2019-05-21 LAB — POCT FERN TEST: POCT Fern Test: POSITIVE

## 2019-05-21 LAB — ABO/RH: ABO/RH(D): O POS

## 2019-05-21 SURGERY — Surgical Case
Anesthesia: Epidural | Wound class: Clean Contaminated

## 2019-05-21 MED ORDER — FENTANYL-BUPIVACAINE-NACL 0.5-0.125-0.9 MG/250ML-% EP SOLN
12.0000 mL/h | EPIDURAL | Status: DC | PRN
  Filled 2019-05-21: qty 250

## 2019-05-21 MED ORDER — MORPHINE SULFATE (PF) 0.5 MG/ML IJ SOLN
INTRAMUSCULAR | Status: DC | PRN
  Administered 2019-05-21: 2 ug via INTRAVENOUS

## 2019-05-21 MED ORDER — EPHEDRINE 5 MG/ML INJ
INTRAVENOUS | Status: AC
  Filled 2019-05-21: qty 10

## 2019-05-21 MED ORDER — KETOROLAC TROMETHAMINE 30 MG/ML IJ SOLN
INTRAMUSCULAR | Status: AC
  Filled 2019-05-21: qty 1

## 2019-05-21 MED ORDER — PRENATAL MULTIVITAMIN CH
1.0000 | ORAL_TABLET | Freq: Every day | ORAL | Status: DC
  Administered 2019-05-22 – 2019-05-24 (×3): 1 via ORAL
  Filled 2019-05-21 (×3): qty 1

## 2019-05-21 MED ORDER — DEXAMETHASONE SODIUM PHOSPHATE 10 MG/ML IJ SOLN
INTRAMUSCULAR | Status: AC
  Filled 2019-05-21: qty 1

## 2019-05-21 MED ORDER — MEPERIDINE HCL 25 MG/ML IJ SOLN: 6.2500 mg | INTRAMUSCULAR | Status: DC | PRN

## 2019-05-21 MED ORDER — SIMETHICONE 80 MG PO CHEW
80.0000 mg | CHEWABLE_TABLET | Freq: Three times a day (TID) | ORAL | Status: DC
  Administered 2019-05-22 – 2019-05-24 (×6): 80 mg via ORAL
  Filled 2019-05-21 (×6): qty 1

## 2019-05-21 MED ORDER — SODIUM CHLORIDE 0.9% FLUSH: 3.0000 mL | INTRAVENOUS | Status: DC | PRN

## 2019-05-21 MED ORDER — ONDANSETRON HCL 4 MG/2ML IJ SOLN
4.0000 mg | Freq: Four times a day (QID) | INTRAMUSCULAR | Status: DC | PRN
  Administered 2019-05-21: 4 mg via INTRAVENOUS
  Filled 2019-05-21: qty 2

## 2019-05-21 MED ORDER — MORPHINE SULFATE (PF) 0.5 MG/ML IJ SOLN
INTRAMUSCULAR | Status: AC
  Filled 2019-05-21: qty 10

## 2019-05-21 MED ORDER — DEXAMETHASONE SODIUM PHOSPHATE 10 MG/ML IJ SOLN
INTRAMUSCULAR | Status: DC | PRN
  Administered 2019-05-21: 10 mg via INTRAVENOUS

## 2019-05-21 MED ORDER — NALBUPHINE HCL 10 MG/ML IJ SOLN: 5.0000 mg | INTRAMUSCULAR | Status: DC | PRN

## 2019-05-21 MED ORDER — HYDROMORPHONE HCL 1 MG/ML IJ SOLN: 0.2000 mg | INTRAMUSCULAR | Status: DC | PRN

## 2019-05-21 MED ORDER — METOCLOPRAMIDE HCL 5 MG/ML IJ SOLN
INTRAMUSCULAR | Status: DC | PRN
  Administered 2019-05-21: 10 mg via INTRAVENOUS

## 2019-05-21 MED ORDER — ALBUMIN HUMAN 5 % IV SOLN
INTRAVENOUS | Status: AC
  Filled 2019-05-21: qty 250

## 2019-05-21 MED ORDER — KETOROLAC TROMETHAMINE 30 MG/ML IJ SOLN
30.0000 mg | Freq: Once | INTRAMUSCULAR | Status: AC | PRN
  Administered 2019-05-21: 30 mg via INTRAVENOUS

## 2019-05-21 MED ORDER — CARBOPROST TROMETHAMINE 250 MCG/ML IM SOLN
INTRAMUSCULAR | Status: AC
  Filled 2019-05-21: qty 1

## 2019-05-21 MED ORDER — OXYTOCIN 40 UNITS IN NORMAL SALINE INFUSION - SIMPLE MED: 2.5000 [IU]/h | INTRAVENOUS | Status: AC

## 2019-05-21 MED ORDER — OXYTOCIN 10 UNIT/ML IJ SOLN
INTRAMUSCULAR | Status: DC | PRN
  Administered 2019-05-21: 40 [IU]

## 2019-05-21 MED ORDER — IBUPROFEN 800 MG PO TABS
800.0000 mg | ORAL_TABLET | Freq: Four times a day (QID) | ORAL | Status: DC
  Filled 2019-05-21: qty 1

## 2019-05-21 MED ORDER — DIBUCAINE (PERIANAL) 1 % EX OINT: 1.0000 "application " | TOPICAL_OINTMENT | CUTANEOUS | Status: DC | PRN

## 2019-05-21 MED ORDER — OXYCODONE-ACETAMINOPHEN 5-325 MG PO TABS: 2.0000 | ORAL_TABLET | ORAL | Status: DC | PRN

## 2019-05-21 MED ORDER — KETOROLAC TROMETHAMINE 30 MG/ML IJ SOLN
30.0000 mg | Freq: Four times a day (QID) | INTRAMUSCULAR | Status: DC
  Administered 2019-05-22: 30 mg via INTRAVENOUS
  Filled 2019-05-21: qty 1

## 2019-05-21 MED ORDER — OXYCODONE-ACETAMINOPHEN 5-325 MG PO TABS: 1.0000 | ORAL_TABLET | ORAL | Status: DC | PRN

## 2019-05-21 MED ORDER — TERBUTALINE SULFATE 1 MG/ML IJ SOLN: 0.2500 mg | Freq: Once | INTRAMUSCULAR | Status: DC | PRN

## 2019-05-21 MED ORDER — MORPHINE SULFATE (PF) 0.5 MG/ML IJ SOLN
INTRAMUSCULAR | Status: DC | PRN
  Administered 2019-05-21: 3 mg via EPIDURAL

## 2019-05-21 MED ORDER — SOD CITRATE-CITRIC ACID 500-334 MG/5ML PO SOLN
30.0000 mL | ORAL | Status: DC | PRN
  Administered 2019-05-21: 30 mL via ORAL
  Filled 2019-05-21: qty 30

## 2019-05-21 MED ORDER — ACETAMINOPHEN 500 MG PO TABS
1000.0000 mg | ORAL_TABLET | Freq: Four times a day (QID) | ORAL | Status: DC
  Administered 2019-05-22 – 2019-05-24 (×7): 1000 mg via ORAL
  Filled 2019-05-21 (×8): qty 2

## 2019-05-21 MED ORDER — LACTATED RINGERS IV SOLN: INTRAVENOUS | Status: DC

## 2019-05-21 MED ORDER — NALBUPHINE HCL 10 MG/ML IJ SOLN
5.0000 mg | Freq: Once | INTRAMUSCULAR | Status: AC | PRN
  Administered 2019-05-23: 5 mg via SUBCUTANEOUS
  Filled 2019-05-21: qty 1

## 2019-05-21 MED ORDER — NALOXONE HCL 0.4 MG/ML IJ SOLN: 0.4000 mg | INTRAMUSCULAR | Status: DC | PRN

## 2019-05-21 MED ORDER — LACTATED RINGERS IV SOLN: INTRAVENOUS | Status: DC | PRN

## 2019-05-21 MED ORDER — PHENYLEPHRINE 40 MCG/ML (10ML) SYRINGE FOR IV PUSH (FOR BLOOD PRESSURE SUPPORT)
80.0000 ug | PREFILLED_SYRINGE | INTRAVENOUS | Status: DC | PRN
  Administered 2019-05-21: 80 ug via INTRAVENOUS

## 2019-05-21 MED ORDER — HYDROMORPHONE HCL 1 MG/ML IJ SOLN
INTRAMUSCULAR | Status: AC
  Filled 2019-05-21: qty 1

## 2019-05-21 MED ORDER — DIPHENHYDRAMINE HCL 50 MG/ML IJ SOLN: 12.5000 mg | INTRAMUSCULAR | Status: DC | PRN

## 2019-05-21 MED ORDER — FENTANYL-BUPIVACAINE-NACL 0.5-0.125-0.9 MG/250ML-% EP SOLN: 12.0000 mL/h | EPIDURAL | Status: DC | PRN

## 2019-05-21 MED ORDER — MIDAZOLAM HCL 2 MG/2ML IJ SOLN
INTRAMUSCULAR | Status: DC | PRN
  Administered 2019-05-21: 1 mg via INTRAVENOUS

## 2019-05-21 MED ORDER — ACETAMINOPHEN 325 MG PO TABS: 650.0000 mg | ORAL_TABLET | ORAL | Status: DC | PRN

## 2019-05-21 MED ORDER — PHENYLEPHRINE 40 MCG/ML (10ML) SYRINGE FOR IV PUSH (FOR BLOOD PRESSURE SUPPORT)
80.0000 ug | PREFILLED_SYRINGE | INTRAVENOUS | Status: DC | PRN
  Filled 2019-05-21: qty 10

## 2019-05-21 MED ORDER — LIDOCAINE-EPINEPHRINE (PF) 2 %-1:200000 IJ SOLN
INTRAMUSCULAR | Status: DC | PRN
  Administered 2019-05-21: 5 mL via EPIDURAL

## 2019-05-21 MED ORDER — NALOXONE HCL 4 MG/10ML IJ SOLN
1.0000 ug/kg/h | INTRAVENOUS | Status: DC | PRN
  Filled 2019-05-21: qty 5

## 2019-05-21 MED ORDER — EPHEDRINE 5 MG/ML INJ
10.0000 mg | INTRAVENOUS | Status: DC | PRN
  Administered 2019-05-21: 10 mg via INTRAVENOUS

## 2019-05-21 MED ORDER — EPHEDRINE 5 MG/ML INJ: 10.0000 mg | INTRAVENOUS | Status: DC | PRN

## 2019-05-21 MED ORDER — HYDROXYZINE HCL 50 MG PO TABS: 50.0000 mg | ORAL_TABLET | Freq: Four times a day (QID) | ORAL | Status: DC | PRN

## 2019-05-21 MED ORDER — LACTATED RINGERS IV SOLN
500.0000 mL | INTRAVENOUS | Status: DC | PRN
  Administered 2019-05-21: 1000 mL via INTRAVENOUS

## 2019-05-21 MED ORDER — ONDANSETRON HCL 4 MG/2ML IJ SOLN
INTRAMUSCULAR | Status: AC
  Filled 2019-05-21: qty 2

## 2019-05-21 MED ORDER — CEFAZOLIN SODIUM-DEXTROSE 2-4 GM/100ML-% IV SOLN
2.0000 g | Freq: Once | INTRAVENOUS | Status: AC
  Administered 2019-05-21: 2 g via INTRAVENOUS

## 2019-05-21 MED ORDER — ONDANSETRON HCL 4 MG/2ML IJ SOLN
4.0000 mg | Freq: Three times a day (TID) | INTRAMUSCULAR | Status: DC | PRN
  Administered 2019-05-22: 4 mg via INTRAVENOUS

## 2019-05-21 MED ORDER — LACTATED RINGERS IV SOLN: 500.0000 mL | Freq: Once | INTRAVENOUS | Status: DC

## 2019-05-21 MED ORDER — ALBUMIN HUMAN 5 % IV SOLN
12.5000 g | Freq: Once | INTRAVENOUS | Status: AC
  Administered 2019-05-21: 12.5 g via INTRAVENOUS
  Filled 2019-05-21: qty 250

## 2019-05-21 MED ORDER — LACTATED RINGERS IV SOLN
500.0000 mL | Freq: Once | INTRAVENOUS | Status: AC
  Administered 2019-05-21: 500 mL via INTRAVENOUS

## 2019-05-21 MED ORDER — CARBOPROST TROMETHAMINE 250 MCG/ML IM SOLN
INTRAMUSCULAR | Status: DC | PRN
  Administered 2019-05-21: 250 ug via INTRAMUSCULAR

## 2019-05-21 MED ORDER — SIMETHICONE 80 MG PO CHEW: 80.0000 mg | CHEWABLE_TABLET | ORAL | Status: DC | PRN

## 2019-05-21 MED ORDER — MIDAZOLAM HCL 2 MG/2ML IJ SOLN
INTRAMUSCULAR | Status: AC
  Filled 2019-05-21: qty 2

## 2019-05-21 MED ORDER — OXYCODONE HCL 5 MG PO TABS: 5.0000 mg | ORAL_TABLET | Freq: Once | ORAL | Status: DC | PRN

## 2019-05-21 MED ORDER — METOCLOPRAMIDE HCL 5 MG/ML IJ SOLN
INTRAMUSCULAR | Status: AC
  Filled 2019-05-21: qty 2

## 2019-05-21 MED ORDER — KETOROLAC TROMETHAMINE 30 MG/ML IJ SOLN: 30.0000 mg | Freq: Four times a day (QID) | INTRAMUSCULAR | Status: DC | PRN

## 2019-05-21 MED ORDER — TETANUS-DIPHTH-ACELL PERTUSSIS 5-2.5-18.5 LF-MCG/0.5 IM SUSP: 0.5000 mL | Freq: Once | INTRAMUSCULAR | Status: DC

## 2019-05-21 MED ORDER — BUTORPHANOL TARTRATE 1 MG/ML IJ SOLN: 1.0000 mg | INTRAMUSCULAR | Status: DC | PRN

## 2019-05-21 MED ORDER — PROMETHAZINE HCL 25 MG/ML IJ SOLN: 6.2500 mg | INTRAMUSCULAR | Status: DC | PRN

## 2019-05-21 MED ORDER — ZOLPIDEM TARTRATE 5 MG PO TABS: 5.0000 mg | ORAL_TABLET | Freq: Every evening | ORAL | Status: DC | PRN

## 2019-05-21 MED ORDER — ACETAMINOPHEN 325 MG PO TABS
650.0000 mg | ORAL_TABLET | ORAL | Status: DC | PRN
  Administered 2019-05-21: 650 mg via ORAL
  Filled 2019-05-21: qty 2

## 2019-05-21 MED ORDER — SIMETHICONE 80 MG PO CHEW
80.0000 mg | CHEWABLE_TABLET | ORAL | Status: DC
  Administered 2019-05-22 – 2019-05-23 (×3): 80 mg via ORAL
  Filled 2019-05-21 (×3): qty 1

## 2019-05-21 MED ORDER — SODIUM BICARBONATE 8.4 % IV SOLN
INTRAVENOUS | Status: DC | PRN
  Administered 2019-05-21 (×2): 5 mL via EPIDURAL

## 2019-05-21 MED ORDER — PHENYLEPHRINE 40 MCG/ML (10ML) SYRINGE FOR IV PUSH (FOR BLOOD PRESSURE SUPPORT)
PREFILLED_SYRINGE | INTRAVENOUS | Status: DC | PRN
  Administered 2019-05-21 (×4): 80 ug via INTRAVENOUS

## 2019-05-21 MED ORDER — SODIUM CHLORIDE (PF) 0.9 % IJ SOLN
INTRAMUSCULAR | Status: DC | PRN
  Administered 2019-05-21: 12 mL/h via EPIDURAL

## 2019-05-21 MED ORDER — HYDROMORPHONE HCL 1 MG/ML IJ SOLN
0.2500 mg | INTRAMUSCULAR | Status: DC | PRN
  Administered 2019-05-21 (×4): 0.5 mg via INTRAVENOUS

## 2019-05-21 MED ORDER — SCOPOLAMINE 1 MG/3DAYS TD PT72
1.0000 | MEDICATED_PATCH | Freq: Once | TRANSDERMAL | Status: DC
  Administered 2019-05-21: 1.5 mg via TRANSDERMAL

## 2019-05-21 MED ORDER — NALBUPHINE HCL 10 MG/ML IJ SOLN: 5.0000 mg | Freq: Once | INTRAMUSCULAR | Status: AC | PRN

## 2019-05-21 MED ORDER — FENTANYL CITRATE (PF) 100 MCG/2ML IJ SOLN
INTRAMUSCULAR | Status: AC
  Filled 2019-05-21: qty 2

## 2019-05-21 MED ORDER — DIPHENHYDRAMINE HCL 25 MG PO CAPS
25.0000 mg | ORAL_CAPSULE | ORAL | Status: DC | PRN
  Filled 2019-05-21: qty 1

## 2019-05-21 MED ORDER — DIPHENHYDRAMINE HCL 25 MG PO CAPS
25.0000 mg | ORAL_CAPSULE | Freq: Four times a day (QID) | ORAL | Status: DC | PRN
  Administered 2019-05-22: 25 mg via ORAL

## 2019-05-21 MED ORDER — OXYCODONE HCL 5 MG/5ML PO SOLN: 5.0000 mg | Freq: Once | ORAL | Status: DC | PRN

## 2019-05-21 MED ORDER — SODIUM CHLORIDE 0.9 % IV SOLN: INTRAVENOUS | Status: DC | PRN

## 2019-05-21 MED ORDER — LIDOCAINE HCL (PF) 1 % IJ SOLN: 30.0000 mL | INTRAMUSCULAR | Status: DC | PRN

## 2019-05-21 MED ORDER — OXYTOCIN 40 UNITS IN NORMAL SALINE INFUSION - SIMPLE MED
1.0000 m[IU]/min | INTRAVENOUS | Status: DC
  Administered 2019-05-21: 2 m[IU]/min via INTRAVENOUS
  Filled 2019-05-21: qty 1000

## 2019-05-21 MED ORDER — SCOPOLAMINE 1 MG/3DAYS TD PT72
MEDICATED_PATCH | TRANSDERMAL | Status: AC
  Filled 2019-05-21: qty 1

## 2019-05-21 MED ORDER — SENNOSIDES-DOCUSATE SODIUM 8.6-50 MG PO TABS
2.0000 | ORAL_TABLET | ORAL | Status: DC
  Administered 2019-05-22 – 2019-05-23 (×3): 2 via ORAL
  Filled 2019-05-21 (×3): qty 2

## 2019-05-21 MED ORDER — WITCH HAZEL-GLYCERIN EX PADS: 1.0000 "application " | MEDICATED_PAD | CUTANEOUS | Status: DC | PRN

## 2019-05-21 MED ORDER — OXYTOCIN 40 UNITS IN NORMAL SALINE INFUSION - SIMPLE MED: 2.5000 [IU]/h | INTRAVENOUS | Status: DC

## 2019-05-21 MED ORDER — ACETAMINOPHEN 500 MG PO TABS
1000.0000 mg | ORAL_TABLET | Freq: Four times a day (QID) | ORAL | Status: DC
  Administered 2019-05-22 (×2): 1000 mg via ORAL
  Filled 2019-05-21 (×2): qty 2

## 2019-05-21 MED ORDER — COCONUT OIL OIL: 1.0000 "application " | TOPICAL_OIL | Status: DC | PRN

## 2019-05-21 MED ORDER — PHENYLEPHRINE 40 MCG/ML (10ML) SYRINGE FOR IV PUSH (FOR BLOOD PRESSURE SUPPORT)
PREFILLED_SYRINGE | INTRAVENOUS | Status: AC
  Filled 2019-05-21: qty 10

## 2019-05-21 MED ORDER — ONDANSETRON HCL 4 MG/2ML IJ SOLN
INTRAMUSCULAR | Status: DC | PRN
  Administered 2019-05-21: 4 mg via INTRAVENOUS

## 2019-05-21 MED ORDER — OXYTOCIN BOLUS FROM INFUSION: 500.0000 mL | Freq: Once | INTRAVENOUS | Status: DC

## 2019-05-21 MED ORDER — OXYCODONE HCL 5 MG PO TABS
5.0000 mg | ORAL_TABLET | ORAL | Status: DC | PRN
  Administered 2019-05-22 – 2019-05-23 (×3): 5 mg via ORAL
  Administered 2019-05-23: 10 mg via ORAL
  Administered 2019-05-24 (×2): 5 mg via ORAL
  Filled 2019-05-21: qty 1
  Filled 2019-05-21: qty 2
  Filled 2019-05-21 (×4): qty 1

## 2019-05-21 MED ORDER — MENTHOL 3 MG MT LOZG: 1.0000 | LOZENGE | OROMUCOSAL | Status: DC | PRN

## 2019-05-21 SURGICAL SUPPLY — 46 items
ADH SKN CLS APL DERMABOND .7 (GAUZE/BANDAGES/DRESSINGS) ×1
APL SKNCLS STERI-STRIP NONHPOA (GAUZE/BANDAGES/DRESSINGS) ×1
BENZOIN TINCTURE PRP APPL 2/3 (GAUZE/BANDAGES/DRESSINGS) ×2 IMPLANT
CHLORAPREP W/TINT 26ML (MISCELLANEOUS) ×3 IMPLANT
CLAMP CORD UMBIL (MISCELLANEOUS) IMPLANT
CLOSURE WOUND 1/2 X4 (GAUZE/BANDAGES/DRESSINGS) ×1
CLOTH BEACON ORANGE TIMEOUT ST (SAFETY) ×3 IMPLANT
DERMABOND ADVANCED (GAUZE/BANDAGES/DRESSINGS) ×2
DERMABOND ADVANCED .7 DNX12 (GAUZE/BANDAGES/DRESSINGS) ×1 IMPLANT
DRSG OPSITE POSTOP 4X10 (GAUZE/BANDAGES/DRESSINGS) ×3 IMPLANT
ELECT REM PT RETURN 9FT ADLT (ELECTROSURGICAL) ×3
ELECTRODE REM PT RTRN 9FT ADLT (ELECTROSURGICAL) ×1 IMPLANT
EXTRACTOR VACUUM KIWI (MISCELLANEOUS) IMPLANT
GLOVE BIO SURGEON STRL SZ 6.5 (GLOVE) ×2 IMPLANT
GLOVE BIO SURGEONS STRL SZ 6.5 (GLOVE) ×1
GLOVE BIOGEL PI IND STRL 6.5 (GLOVE) ×1 IMPLANT
GLOVE BIOGEL PI IND STRL 7.0 (GLOVE) ×2 IMPLANT
GLOVE BIOGEL PI INDICATOR 6.5 (GLOVE) ×2
GLOVE BIOGEL PI INDICATOR 7.0 (GLOVE) ×4
GOWN STRL REUS W/TWL LRG LVL3 (GOWN DISPOSABLE) ×6 IMPLANT
HEMOSTAT ARISTA ABSORB 3G PWDR (HEMOSTASIS) ×2 IMPLANT
KIT ABG SYR 3ML LUER SLIP (SYRINGE) ×3 IMPLANT
NDL HYPO 25X5/8 SAFETYGLIDE (NEEDLE) ×1 IMPLANT
NEEDLE HYPO 25X5/8 SAFETYGLIDE (NEEDLE) ×3 IMPLANT
NS IRRIG 1000ML POUR BTL (IV SOLUTION) ×3 IMPLANT
PACK C SECTION WH (CUSTOM PROCEDURE TRAY) ×3 IMPLANT
PAD ABD 7.5X8 STRL (GAUZE/BANDAGES/DRESSINGS) ×2 IMPLANT
PAD OB MATERNITY 4.3X12.25 (PERSONAL CARE ITEMS) ×3 IMPLANT
PENCIL SMOKE EVAC W/HOLSTER (ELECTROSURGICAL) ×3 IMPLANT
SPONGE GAUZE 4X4 12PLY STER LF (GAUZE/BANDAGES/DRESSINGS) ×4 IMPLANT
SPONGE LAP 18X18 X RAY DECT (DISPOSABLE) ×2 IMPLANT
STRIP CLOSURE SKIN 1/2X4 (GAUZE/BANDAGES/DRESSINGS) ×1 IMPLANT
SUT PLAIN 0 NONE (SUTURE) IMPLANT
SUT PLAIN 2 0 (SUTURE) ×3
SUT PLAIN ABS 2-0 CT1 27XMFL (SUTURE) ×1 IMPLANT
SUT VIC AB 0 CT1 36 (SUTURE) ×3 IMPLANT
SUT VIC AB 0 CTX 36 (SUTURE) ×9
SUT VIC AB 0 CTX36XBRD ANBCTRL (SUTURE) ×2 IMPLANT
SUT VIC AB 3-0 CT1 27 (SUTURE) ×3
SUT VIC AB 3-0 CT1 TAPERPNT 27 (SUTURE) IMPLANT
SUT VIC AB 3-0 SH 27 (SUTURE) ×3
SUT VIC AB 3-0 SH 27X BRD (SUTURE) IMPLANT
SUT VIC AB 4-0 PS2 27 (SUTURE) ×5 IMPLANT
TOWEL OR 17X24 6PK STRL BLUE (TOWEL DISPOSABLE) ×3 IMPLANT
TRAY FOLEY W/BAG SLVR 14FR LF (SET/KITS/TRAYS/PACK) IMPLANT
WATER STERILE IRR 1000ML POUR (IV SOLUTION) ×5 IMPLANT

## 2019-05-21 NOTE — Progress Notes (Addendum)
Patient now pushing for >3 hours and hasn't made progress past +1 station. Recommend primary cesarean section for arrest of descent. OT. Risks discussed including infection, bleeding, damage to surrounding structures, the need for additional procedures including hysterectomy, and the possibility of uterine rupture with neonatal morbidity/mortality, scarring, and abnormal placentation with subsequent pregnancies. Patient agrees to proceed. 2g ancef on call to OR.     Rosie Fate MD

## 2019-05-21 NOTE — Progress Notes (Signed)
C/c/0 ROT Manual rotation to just a smidgen ROA Continue to push

## 2019-05-21 NOTE — H&P (Signed)
Brittany Johnston is a 34 y.o. female presenting for SROM for meconium. Contracting every few min or so. Hx of MS and on Copaxone. Pregnancy otherwise uncomplicated.    OB History    Gravida  1   Para      Term      Preterm      AB      Living        SAB      TAB      Ectopic      Multiple      Live Births             Past Medical History:  Diagnosis Date  . Multiple sclerosis (Kay)   . Vision abnormalities    Past Surgical History:  Procedure Laterality Date  . LEEP     suppose to have follow up of cervical cells in January  . MYRINGOTOMY    . WISDOM TOOTH EXTRACTION     Family History: family history includes Diabetes in her maternal grandmother; Healthy in her father; Heart disease in her mother; Leukemia in her paternal grandfather. Social History:  reports that she has never smoked. She has never used smokeless tobacco. She reports current alcohol use. She reports that she does not use drugs.     Maternal Diabetes: No Genetic Screening: Normal Maternal Ultrasounds/Referrals: Normal Fetal Ultrasounds or other Referrals:  None Maternal Substance Abuse:  No Significant Maternal Medications:  None Significant Maternal Lab Results:  None Other Comments:  None  Review of Systems History Dilation: 2.5 Effacement (%): 80 Station: -2 Exam by:: T Lytle RN  Blood pressure 131/86, pulse 94, temperature 98 F (36.7 C), temperature source Oral, resp. rate 16, height 5' 4.5" (1.638 m), weight 90.3 kg, last menstrual period 08/16/2018, SpO2 99 %. Exam Physical Exam  Prenatal labs: ABO, Rh: O/Positive/-- (06/09 0000) Antibody: Negative (06/09 0000) Rubella: Immune (06/09 0000) RPR: Nonreactive (06/09 0000)  HBsAg: Negative (06/09 0000)  HIV: Non-reactive (06/09 0000)  GBS: Negative/-- (12/22 0000)   Assessment/Plan: 34 yo G1P0 @ 39.5 presenting for PROM and possible early labor. S/p PROM with meconium. Augment with pitocin. MMR pp as Rubella NON  immune.  GBS negative.     Tyson Dense 05/21/2019, 9:58 AM

## 2019-05-21 NOTE — MAU Note (Signed)
covid swab collected and walked to lab

## 2019-05-21 NOTE — Progress Notes (Signed)
PROCEDURE DATE: 05/21/19  PREOPERATIVE DIAGNOSIS: Intrauterine pregnancy at 39.5 wga, Indication: arrest of descent  POSTOPERATIVE DIAGNOSIS:The same  PROCEDURE: Primary Low TransverseCesarean Section, Narrow pelvis  SURGEON: Dr. Lucillie Garfinkel Assistant: Dr. Arvilla Meres  INDICATIONS:This is a 34yo G1P0 at 28.5 wga requiring cesarean section secondary to arrest of descent. Patient came in s/p SROM and was augmented with pitocin. Progressed quickly and become completely dilated. She pushed for > 3 hours without further than +1 station. Decision made to proceed with pLTCS.The risks of cesarean section discussed with the patient included but were not limited to: bleeding which may require transfusion or reoperation; infection which may require antibiotics; injury to bowel, bladder, ureters or other surrounding organs; injury to the fetus; need for additional procedures including hysterectomy in the event of a life-threatening hemorrhage; placental abnormalities wth subsequent pregnancies, incisional problems, thromboembolic phenomenon and other postoperative/anesthesia complications. The patient agreed with the proposed plan, giving informed consent for the procedure.   FINDINGS: Viable femaleinfant in vertex presentation (OT),narrow pelvis, APGARs pending, Weight pending, Amniotic fluid clear, Intact placenta, three vessel cord. Grossly normal uterus.  .  ANESTHESIA: Epidural ESTIMATED BLOOD LOSS: 500cc SPECIMENS: Placenta for routine COMPLICATIONS: None immediate  PROCEDURE IN DETAIL: The patient received intravenous antibiotics (2g Ancef) and had sequential compression devices applied to her lower extremities while in the preoperative area. Shewasthen taken to the operating roomwhere epidural anesthesiawas dosed up to surgical level andwas found to be adequate. She was then placed in a dorsal supine position with a leftward tilt,and prepped and draped in a sterile  manner.A foley catheter was placed into her bladder and attached to constant gravity. After an adequate timeout was performed, aPfannenstiel skin incision was made with scalpel and carried through to the underlying layer of fascia. The fascia was incised in the midline and this incision was extended bilaterally using the Mayo scissors. Kocher clamps were applied to the superior aspect of the fascial incision and the underlying rectus muscles were dissected off bluntly. A similar process was carried out on the inferior aspect of the facial incision. The rectus muscles were separated in the midline bluntly and the peritoneum was entered bluntly. A bladder flap was created sharply and developed bluntly.Atransverse hysterotomy was made with a scalpel and extended bilaterally bluntly. The bladder blade was then removed. The infant was successfully delivered, and cord was clamped and cut and infant was handed over to awaiting neonatology team.   Of note, patient with VERY narrow pelvis. Uterine massage was then administered and the placenta delivered intact with three-vessel cord. Cord gases were taken. The uterus was cleared of clot and debris. The hysterotomy was closed with 0 vicryl. Bilateral extensions deep into the lower uterine segment but still above the bladder were repaired with separate continuous stitches.A second imbricating suture of 0-vicryl was used to reinforce the incision and aid in hemostasis. Arista placed to achieve hemostasis. No hematomas were noted anywhere in the pelvis after meticulous inspection.   The fascia was closed with 0-Vicryl in a running fashion with good restoration of anatomy. The subcutaneus tissue was irrigated and was reapproximated using three interrupted plain gut stitches. The skin was closed with 4-0 Vicryl in a subcuticular fashion.  Final EBL was 500cc (all surgical site and was hemostatic at end of procedure) without any further bleeding on exam.     It's a girl - "Kaleen Odea"!!   Pt tolerated the procedure well. All sponge/lap/needle counts were correct X 2. Pt taken to recovery room in  stable condition.   Belva Agee MD

## 2019-05-21 NOTE — MAU Note (Signed)
Brittany Johnston is a 34 y.o. at [redacted]w[redacted]d here in MAU reporting: at 50 pt felt a big pop and then had a big gush of fluid. States it was yellow. No FM since LOF. Having some contractions since her water broke. No bleeding.   Onset of complaint: today  Pain score: 7/10  Vitals:   05/21/19 0919  BP: 121/83  Pulse: 98  Resp: 16  Temp: 98 F (36.7 C)  SpO2: 99%     FHT: 140  Lab orders placed from triage: none

## 2019-05-21 NOTE — Transfer of Care (Signed)
Immediate Anesthesia Transfer of Care Note  Patient: Brittany Johnston  Procedure(s) Performed: CESAREAN SECTION (N/A )  Patient Location: PACU  Anesthesia Type:Epidural  Level of Consciousness: awake, alert  and oriented  Airway & Oxygen Therapy: Patient Spontanous Breathing  Post-op Assessment: Report given to RN and Post -op Vital signs reviewed and stable  Post vital signs: Reviewed and stable  Last Vitals:  Vitals Value Taken Time  BP 174/106 05/21/19 2127  Temp    Pulse 88 05/21/19 2128  Resp 17 05/21/19 2128  SpO2 98 % 05/21/19 2128  Vitals shown include unvalidated device data.  Last Pain:  Vitals:   05/21/19 1801  TempSrc: Axillary  PainSc:          Complications: No apparent anesthesia complications

## 2019-05-21 NOTE — Anesthesia Preprocedure Evaluation (Signed)
Anesthesia Evaluation    Reviewed: Allergy & Precautions, Patient's Chart, lab work & pertinent test results  Airway Mallampati: II       Dental no notable dental hx.    Pulmonary neg pulmonary ROS,    Pulmonary exam normal        Cardiovascular negative cardio ROS   Rhythm:Regular Rate:Normal     Neuro/Psych PSYCHIATRIC DISORDERS Anxiety Depression negative neurological ROS     GI/Hepatic Neg liver ROS,   Endo/Other    Renal/GU      Musculoskeletal   Abdominal   Peds  Hematology negative hematology ROS (+)   Anesthesia Other Findings   Reproductive/Obstetrics (+) Pregnancy                             Anesthesia Physical Anesthesia Plan  ASA: II  Anesthesia Plan: Epidural   Post-op Pain Management:    Induction:   PONV Risk Score and Plan:   Airway Management Planned: Natural Airway  Additional Equipment:   Intra-op Plan:   Post-operative Plan:   Informed Consent: I have reviewed the patients History and Physical, chart, labs and discussed the procedure including the risks, benefits and alternatives for the proposed anesthesia with the patient or authorized representative who has indicated his/her understanding and acceptance.       Plan Discussed with:   Anesthesia Plan Comments: (Lab Results      Component                Value               Date                      WBC                      16.1 (H)            05/21/2019                HGB                      12.8                05/21/2019                HCT                      38.5                05/21/2019                MCV                      90.8                05/21/2019                PLT                      306                 05/21/2019            Covid-19 Nucleic Acid Test Results No results found for: SARSCOV2NAA, SARSCOV2 )        Anesthesia Quick Evaluation

## 2019-05-21 NOTE — Progress Notes (Signed)
SVE 9/c/-2 ROT Peanut ball

## 2019-05-21 NOTE — Anesthesia Procedure Notes (Signed)
Epidural Patient location during procedure: OB Start time: 05/21/2019 11:05 AM End time: 05/21/2019 11:09 AM  Staffing Anesthesiologist: Shelton Silvas, MD Performed: anesthesiologist   Preanesthetic Checklist Completed: patient identified, IV checked, site marked, risks and benefits discussed, surgical consent, monitors and equipment checked, pre-op evaluation and timeout performed  Epidural Patient position: sitting Prep: ChloraPrep Patient monitoring: heart rate, continuous pulse ox and blood pressure Approach: midline Location: L3-L4 Injection technique: LOR saline  Needle:  Needle type: Tuohy  Needle gauge: 17 G Needle length: 9 cm Catheter type: closed end flexible Catheter size: 20 Guage Test dose: negative and 1.5% lidocaine  Assessment Events: blood not aspirated, injection not painful, no injection resistance and no paresthesia  Additional Notes LOR @ 4  Patient identified. Risks/Benefits/Options discussed with patient including but not limited to bleeding, infection, nerve damage, paralysis, failed block, incomplete pain control, headache, blood pressure changes, nausea, vomiting, reactions to medications, itching and postpartum back pain. Confirmed with bedside nurse the patient's most recent platelet count. Confirmed with patient that they are not currently taking any anticoagulation, have any bleeding history or any family history of bleeding disorders. Patient expressed understanding and wished to proceed. All questions were answered. Sterile technique was used throughout the entire procedure. Please see nursing notes for vital signs. Test dose was given through epidural catheter and negative prior to continuing to dose epidural or start infusion. Warning signs of high block given to the patient including shortness of breath, tingling/numbness in hands, complete motor block, or any concerning symptoms with instructions to call for help. Patient was given instructions  on fall risk and not to get out of bed. All questions and concerns addressed with instructions to call with any issues or inadequate analgesia.    Reason for block:procedure for pain

## 2019-05-21 NOTE — Progress Notes (Signed)
Decision for csection

## 2019-05-22 ENCOUNTER — Inpatient Hospital Stay (HOSPITAL_COMMUNITY): Payer: No Typology Code available for payment source | Admitting: Anesthesiology

## 2019-05-22 ENCOUNTER — Encounter (HOSPITAL_COMMUNITY): Payer: Self-pay | Admitting: Obstetrics and Gynecology

## 2019-05-22 ENCOUNTER — Encounter (HOSPITAL_COMMUNITY)
Admission: AD | Disposition: A | Discharge status: Home / Self Care | Payer: Self-pay | Source: Home / Self Care | Attending: Obstetrics and Gynecology

## 2019-05-22 HISTORY — PX: CYSTOSCOPY: SHX5120

## 2019-05-22 HISTORY — PX: LAPAROTOMY: SHX154

## 2019-05-22 LAB — POCT I-STAT EG7
Acid-base deficit: 6 mmol/L — ABNORMAL HIGH (ref 0.0–2.0)
Bicarbonate: 19.6 mmol/L — ABNORMAL LOW (ref 20.0–28.0)
Calcium, Ion: 1.25 mmol/L (ref 1.15–1.40)
HCT: 31 % — ABNORMAL LOW (ref 36.0–46.0)
Hemoglobin: 10.5 g/dL — ABNORMAL LOW (ref 12.0–15.0)
O2 Saturation: 98 %
Potassium: 4.1 mmol/L (ref 3.5–5.1)
Sodium: 133 mmol/L — ABNORMAL LOW (ref 135–145)
TCO2: 21 mmol/L — ABNORMAL LOW (ref 22–32)
pCO2, Ven: 39.6 mmHg — ABNORMAL LOW (ref 44.0–60.0)
pH, Ven: 7.302 (ref 7.250–7.430)
pO2, Ven: 123 mmHg — ABNORMAL HIGH (ref 32.0–45.0)

## 2019-05-22 LAB — CBC
HCT: 32.6 % — ABNORMAL LOW (ref 36.0–46.0)
HCT: 29.5 % — ABNORMAL LOW (ref 36.0–46.0)
Hemoglobin: 10.6 g/dL — ABNORMAL LOW (ref 12.0–15.0)
Hemoglobin: 9.7 g/dL — ABNORMAL LOW (ref 12.0–15.0)
MCH: 30 pg (ref 26.0–34.0)
MCH: 30.2 pg (ref 26.0–34.0)
MCHC: 32.5 g/dL (ref 30.0–36.0)
MCHC: 32.9 g/dL (ref 30.0–36.0)
MCV: 92.4 fL (ref 80.0–100.0)
MCV: 91.9 fL (ref 80.0–100.0)
Platelets: 283 10*3/uL (ref 150–400)
Platelets: 277 10*3/uL (ref 150–400)
RBC: 3.53 MIL/uL — ABNORMAL LOW (ref 3.87–5.11)
RBC: 3.21 MIL/uL — ABNORMAL LOW (ref 3.87–5.11)
RDW: 13.2 % (ref 11.5–15.5)
RDW: 13.2 % (ref 11.5–15.5)
WBC: 22.2 10*3/uL — ABNORMAL HIGH (ref 4.0–10.5)
WBC: 21.9 10*3/uL — ABNORMAL HIGH (ref 4.0–10.5)
nRBC: 0 % (ref 0.0–0.2)
nRBC: 0 % (ref 0.0–0.2)

## 2019-05-22 LAB — RPR: RPR Ser Ql: NONREACTIVE

## 2019-05-22 LAB — DIC (DISSEMINATED INTRAVASCULAR COAGULATION)PANEL
D-Dimer, Quant: 6.21 ug/mL-FEU — ABNORMAL HIGH (ref 0.00–0.50)
Fibrinogen: 465 mg/dL (ref 210–475)
INR: 1 (ref 0.8–1.2)
Platelets: 272 10*3/uL (ref 150–400)
Prothrombin Time: 13.2 seconds (ref 11.4–15.2)
Smear Review: NONE SEEN
aPTT: 27 seconds (ref 24–36)

## 2019-05-22 SURGERY — LAPAROTOMY
Anesthesia: General | Wound class: Clean Contaminated

## 2019-05-22 MED ORDER — OXYCODONE HCL 5 MG/5ML PO SOLN: 5.0000 mg | Freq: Once | ORAL | Status: DC | PRN

## 2019-05-22 MED ORDER — CEFAZOLIN SODIUM-DEXTROSE 2-4 GM/100ML-% IV SOLN
INTRAVENOUS | Status: AC
  Filled 2019-05-22: qty 100

## 2019-05-22 MED ORDER — EPHEDRINE SULFATE 50 MG/ML IJ SOLN
INTRAMUSCULAR | Status: DC | PRN
  Administered 2019-05-22: 5 mg via INTRAVENOUS

## 2019-05-22 MED ORDER — PROPOFOL 10 MG/ML IV BOLUS
INTRAVENOUS | Status: AC
  Filled 2019-05-22: qty 20

## 2019-05-22 MED ORDER — OXYCODONE HCL 5 MG PO TABS: 5.0000 mg | ORAL_TABLET | Freq: Once | ORAL | Status: DC | PRN

## 2019-05-22 MED ORDER — MIDAZOLAM HCL 2 MG/2ML IJ SOLN
INTRAMUSCULAR | Status: DC | PRN
  Administered 2019-05-22: 2 mg via INTRAVENOUS

## 2019-05-22 MED ORDER — HYDROMORPHONE HCL 1 MG/ML IJ SOLN
0.2500 mg | INTRAMUSCULAR | Status: DC | PRN
  Administered 2019-05-22: 0.5 mg via INTRAVENOUS

## 2019-05-22 MED ORDER — SOD CITRATE-CITRIC ACID 500-334 MG/5ML PO SOLN
ORAL | Status: DC | PRN
  Administered 2019-05-22: 15 mL via ORAL

## 2019-05-22 MED ORDER — PHENYLEPHRINE HCL (PRESSORS) 10 MG/ML IV SOLN
INTRAVENOUS | Status: DC | PRN
  Administered 2019-05-22: 40 mg via INTRAVENOUS

## 2019-05-22 MED ORDER — DEXAMETHASONE SODIUM PHOSPHATE 10 MG/ML IJ SOLN
INTRAMUSCULAR | Status: DC | PRN
  Administered 2019-05-22: 10 mg via INTRAVENOUS

## 2019-05-22 MED ORDER — KETOROLAC TROMETHAMINE 30 MG/ML IJ SOLN: 30.0000 mg | Freq: Once | INTRAMUSCULAR | Status: DC | PRN

## 2019-05-22 MED ORDER — METOCLOPRAMIDE HCL 5 MG/ML IJ SOLN
INTRAMUSCULAR | Status: DC | PRN
  Administered 2019-05-22: 10 mg via INTRAVENOUS

## 2019-05-22 MED ORDER — FENTANYL CITRATE (PF) 100 MCG/2ML IJ SOLN
INTRAMUSCULAR | Status: DC | PRN
  Administered 2019-05-22: 100 ug via INTRAVENOUS

## 2019-05-22 MED ORDER — HYDROMORPHONE HCL 1 MG/ML IJ SOLN
INTRAMUSCULAR | Status: AC
  Filled 2019-05-22: qty 0.5

## 2019-05-22 MED ORDER — CEFAZOLIN SODIUM-DEXTROSE 2-4 GM/100ML-% IV SOLN
2.0000 g | Freq: Once | INTRAVENOUS | Status: DC
  Filled 2019-05-22: qty 100

## 2019-05-22 MED ORDER — ROCURONIUM BROMIDE 10 MG/ML (PF) SYRINGE
PREFILLED_SYRINGE | INTRAVENOUS | Status: AC
  Filled 2019-05-22: qty 10

## 2019-05-22 MED ORDER — MEPERIDINE HCL 25 MG/ML IJ SOLN: 6.2500 mg | INTRAMUSCULAR | Status: DC | PRN

## 2019-05-22 MED ORDER — SODIUM CHLORIDE 0.9% IV SOLUTION
Freq: Once | INTRAVENOUS | Status: AC
  Administered 2019-05-22: 1000 mL via INTRAVENOUS

## 2019-05-22 MED ORDER — DEXAMETHASONE SODIUM PHOSPHATE 10 MG/ML IJ SOLN
INTRAMUSCULAR | Status: AC
  Filled 2019-05-22: qty 2

## 2019-05-22 MED ORDER — LIDOCAINE HCL (CARDIAC) PF 100 MG/5ML IV SOSY
PREFILLED_SYRINGE | INTRAVENOUS | Status: DC | PRN
  Administered 2019-05-22: 60 mg via INTRAVENOUS

## 2019-05-22 MED ORDER — ROCURONIUM BROMIDE 100 MG/10ML IV SOLN
INTRAVENOUS | Status: DC | PRN
  Administered 2019-05-22: 30 mg via INTRAVENOUS

## 2019-05-22 MED ORDER — ALBUMIN HUMAN 5 % IV SOLN: INTRAVENOUS | Status: DC | PRN

## 2019-05-22 MED ORDER — FENTANYL CITRATE (PF) 100 MCG/2ML IJ SOLN
INTRAMUSCULAR | Status: AC
  Filled 2019-05-22: qty 2

## 2019-05-22 MED ORDER — SUCCINYLCHOLINE CHLORIDE 200 MG/10ML IV SOSY
PREFILLED_SYRINGE | INTRAVENOUS | Status: AC
  Filled 2019-05-22: qty 10

## 2019-05-22 MED ORDER — LIDOCAINE 2% (20 MG/ML) 5 ML SYRINGE
INTRAMUSCULAR | Status: AC
  Filled 2019-05-22: qty 5

## 2019-05-22 MED ORDER — PROPOFOL 10 MG/ML IV BOLUS
INTRAVENOUS | Status: DC | PRN
  Administered 2019-05-22: 200 mg via INTRAVENOUS

## 2019-05-22 MED ORDER — IBUPROFEN 800 MG PO TABS
800.0000 mg | ORAL_TABLET | Freq: Four times a day (QID) | ORAL | Status: DC
  Administered 2019-05-22 – 2019-05-24 (×7): 800 mg via ORAL
  Filled 2019-05-22 (×7): qty 1

## 2019-05-22 MED ORDER — METOCLOPRAMIDE HCL 5 MG/ML IJ SOLN
INTRAMUSCULAR | Status: AC
  Filled 2019-05-22: qty 2

## 2019-05-22 MED ORDER — SUGAMMADEX SODIUM 200 MG/2ML IV SOLN
INTRAVENOUS | Status: DC | PRN
  Administered 2019-05-22: 200 mg via INTRAVENOUS

## 2019-05-22 MED ORDER — SUCCINYLCHOLINE CHLORIDE 20 MG/ML IJ SOLN
INTRAMUSCULAR | Status: DC | PRN
  Administered 2019-05-22: 100 mg via INTRAVENOUS

## 2019-05-22 MED ORDER — MIDAZOLAM HCL 2 MG/2ML IJ SOLN
INTRAMUSCULAR | Status: AC
  Filled 2019-05-22: qty 2

## 2019-05-22 MED ORDER — PROMETHAZINE HCL 25 MG/ML IJ SOLN: 6.2500 mg | INTRAMUSCULAR | Status: DC | PRN

## 2019-05-22 MED ORDER — ONDANSETRON HCL 4 MG/2ML IJ SOLN
INTRAMUSCULAR | Status: AC
  Filled 2019-05-22: qty 2

## 2019-05-22 SURGICAL SUPPLY — 6 items
ADH SKN CLS APL DERMABOND .7 (GAUZE/BANDAGES/DRESSINGS) ×1
DERMABOND ADVANCED (GAUZE/BANDAGES/DRESSINGS) ×2
DERMABOND ADVANCED .7 DNX12 (GAUZE/BANDAGES/DRESSINGS) IMPLANT
PAD ABD 7.5X8 STRL (GAUZE/BANDAGES/DRESSINGS) ×2 IMPLANT
SPONGE GAUZE 4X4 12PLY STER LF (GAUZE/BANDAGES/DRESSINGS) ×4 IMPLANT
WATER STERILE IRR 1000ML POUR (IV SOLUTION) ×2 IMPLANT

## 2019-05-22 NOTE — Progress Notes (Signed)
Patient lost IV access this afternoon so RN is unable to give another dose of toradol. Patient refused another IV at this time. Current linked toradol and ibuprofen orders were D/C and ibuprofen was reordered to match current order so that ibuprofen could be administered now per pharmacy instruction. Royston Cowper, RN

## 2019-05-22 NOTE — Progress Notes (Signed)
CSW received consult for hx of Anxiety. CSW met with [name] to offer support and complete assessment.    CSW entered room and congratulated [name] on the birth of infant "Evie". CSW observed that [name] was up in bed holding infant while FOB was on the couch asleep. CSW advised [name] of CSWs' role and the reason for CSW coming to see her. [name] reported that she was diagnosed with anxiety in 2012. [name] reported that at that time she was placed on Xanax and in therapy. [name] expressed that she hasn't taken medications in awhile and reported that she hasn't been in therapy either. [name] informed CSW that she has been doing and feeling fine since not being in therapy or on medications.   [name] reported that she has all needed items to care for infant and expressed that she isn't feeling SI or HI. CSW inquired from [facility] on how she has been since giving birth. CSW aware that [name] has been dealing with health complications since giving birth and wanted to check in on [name]. [name] reported "its unfortunate like things were going text book and then she got stuck". CSW offered support to [name] and encouraged [name] to get some rest and enjoy infant. [name] thanked CSW for stopping by and reported that she has no other needs.   CSW provided education regarding the baby blues period vs. perinatal mood disorders, discussed treatment and gave resources for mental health follow up if concerns arise.  CSW recommends self-evaluation during the postpartum time period using the New Mom Checklist from Postpartum Progress and encouraged [name] to contact a medical professional if symptoms are noted at any time.   CSW provided review of Sudden Infant Death Syndrome (SIDS) precautions.   CSW identifies no further need for intervention and no barriers to discharge at this time.    Virgie Dad Xochilt Conant, MSW, LCSW [facility] [phone]

## 2019-05-22 NOTE — Progress Notes (Signed)
Subjective: Postpartum Day 1: Cesarean Delivery  POD 0 for re op for bleeding Patient reports incisional pain, tolerating PO, + flatus and no problems voiding.    Objective: Vital signs in last 24 hours: Temp:  [96.2 F (35.7 C)-98.4 F (36.9 C)] 97.9 F (36.6 C) (01/19 1535) Pulse Rate:  [56-94] 70 (01/19 1535) Resp:  [10-25] 16 (01/19 1535) BP: (77-123)/(51-78) 112/58 (01/19 1535) SpO2:  [91 %-100 %] 99 % (01/19 1535)  Physical Exam:  General: alert, cooperative, appears stated age and mild distress Lochia: appropriate Uterine Fundus: firm Incision: healing well DVT Evaluation: No evidence of DVT seen on physical exam.  Recent Labs    05/22/19 0409 05/22/19 0852  HGB 10.5* 9.7*  HCT 31.0* 29.5*    Assessment/Plan: Status post Cesarean section. Postoperative course complicated by post op bleed and 2nd laparotomy.  clincally stable and doing well.  Continue current care.  Turner Daniels 05/22/2019, 6:33 PM

## 2019-05-22 NOTE — Anesthesia Postprocedure Evaluation (Signed)
Anesthesia Post Note  Patient: Brittany Johnston  Procedure(s) Performed: LAPAROTOMY (N/A ) CYSTOSCOPY (N/A )     Patient location during evaluation: PACU Anesthesia Type: General Level of consciousness: awake and alert, oriented and patient cooperative Pain management: pain level controlled Vital Signs Assessment: post-procedure vital signs reviewed and stable Respiratory status: spontaneous breathing, nonlabored ventilation and respiratory function stable Cardiovascular status: blood pressure returned to baseline and stable Postop Assessment: no apparent nausea or vomiting Anesthetic complications: no    Last Vitals:  Vitals:   05/22/19 0315 05/22/19 0530  BP: 108/74 (!) 89/69  Pulse: (!) 59 80  Resp: 18 (!) 23  Temp: 36.7 C (!) 35.7 C  SpO2: 98% 100%    Last Pain:  Vitals:   05/22/19 0315  TempSrc: Oral  PainSc: 2    Pain Goal: Patients Stated Pain Goal: 3 (05/21/19 2130)  LLE Motor Response: Purposeful movement (05/22/19 0530)   RLE Motor Response: Purposeful movement (05/22/19 0530)          Lannie Fields

## 2019-05-22 NOTE — Anesthesia Procedure Notes (Signed)
Procedure Name: Intubation Date/Time: 05/22/2019 3:57 AM Performed by: Alvy Bimler, CRNA Pre-anesthesia Checklist: Patient identified, Emergency Drugs available, Suction available and Patient being monitored Patient Re-evaluated:Patient Re-evaluated prior to induction Oxygen Delivery Method: Circle system utilized Preoxygenation: Pre-oxygenation with 100% oxygen Induction Type: IV induction, Rapid sequence and Cricoid Pressure applied Laryngoscope Size: Mac and 3 Grade View: Grade II Tube type: Oral Tube size: 7.0 mm Number of attempts: 1 Airway Equipment and Method: Stylet Placement Confirmation: ETT inserted through vocal cords under direct vision,  positive ETCO2 and breath sounds checked- equal and bilateral Secured at: 21 (right lip) cm Tube secured with: Tape Dental Injury: Teeth and Oropharynx as per pre-operative assessment

## 2019-05-22 NOTE — Progress Notes (Signed)
Called by RN - dressing saturated through honeycomb and pressure dressing. Dressing removed and continue steady oozing from entire incision despite pressure.  Abdomen is distended and moderately ttp.No rebound or guarding. Sterile vaginal exam done with ~300cc of clot sitting in vagina and no atony. BP 80s/60s. Suspect intraabdominal bleeding.  Recommend to OR stat for exam under anesthesia, exploratory laparotomy, cystoscopy and possible hysterectomy. We reviewed the risks including infection, bleeding, damage to the surrounding structures and the need for additional procedures including hysterectomy. Patient agrees to plan. Would accept blood in emergency. Consent signed.  Repeat 2grams of ancef to OR CBC stat Type and cross 2 units pRBC staying 2 units ahead    Rosie Fate MD

## 2019-05-22 NOTE — Anesthesia Preprocedure Evaluation (Addendum)
Anesthesia Evaluation  Patient identified by MRN, date of birth, ID band Patient awake    Reviewed: Allergy & Precautions, Patient's Chart, lab work & pertinent test resultsPreop documentation limited or incomplete due to emergent nature of procedure.  Airway Mallampati: II  TM Distance: >3 FB Neck ROM: Full    Dental no notable dental hx. (+) Teeth Intact, Dental Advisory Given   Pulmonary neg pulmonary ROS,    Pulmonary exam normal breath sounds clear to auscultation       Cardiovascular negative cardio ROS Normal cardiovascular exam Rhythm:Regular Rate:Normal     Neuro/Psych PSYCHIATRIC DISORDERS Anxiety Depression Multiple sclerosis     GI/Hepatic Neg liver ROS,   Endo/Other  negative endocrine ROS  Renal/GU negative Renal ROS  negative genitourinary   Musculoskeletal negative musculoskeletal ROS (+)   Abdominal   Peds  Hematology  (+) anemia ,   Anesthesia Other Findings   Reproductive/Obstetrics c section for failure to progress about 6 hours ago, now hypotensive and oozing from csection wound per OBGYN- for emergent reopening of C section wound and exploration for bleeding, as well as cysto                            Anesthesia Physical  Anesthesia Plan  ASA: III and emergent  Anesthesia Plan: General   Post-op Pain Management:    Induction: Intravenous, Rapid sequence and Cricoid pressure planned  PONV Risk Score and Plan: 4 or greater and Scopolamine patch - Pre-op, Ondansetron, Dexamethasone, Treatment may vary due to age or medical condition and Metaclopromide  Airway Management Planned: Oral ETT  Additional Equipment: None  Intra-op Plan:   Post-operative Plan: Extubation in OR  Informed Consent: I have reviewed the patients History and Physical, chart, labs and discussed the procedure including the risks, benefits and alternatives for the proposed anesthesia with  the patient or authorized representative who has indicated his/her understanding and acceptance.     Dental advisory given  Plan Discussed with: CRNA  Anesthesia Plan Comments: (Type and cross, will send for blood as soon as it is available. DIC panel and CBC pending. Full stomach- patient was fed after C section)       Anesthesia Quick Evaluation

## 2019-05-22 NOTE — Addendum Note (Signed)
Addendum  created 05/22/19 [id number] by Lannie Fields, DO   Clinical Note Signed

## 2019-05-22 NOTE — Progress Notes (Signed)
RN was called to the room at 0215 by pt stating her incision was bleeding. Right side of pressure dressing was leaking, honeycomb saturated and leaking from the bottom. Abdomen distended, fundus firm at umbilicus. No vaginal bleeding present. BP 83/69 HR 56. Dr Elon Spanner notified at 0220,  present at bedside shortly after. See MD note for interventions.   Patrica Duel, RN 05/22/2019 5:07 AM

## 2019-05-22 NOTE — Anesthesia Postprocedure Evaluation (Signed)
Anesthesia Post Note  Patient: Brittany Johnston  Procedure(s) Performed: CESAREAN SECTION (N/A )     Patient location during evaluation: PACU Anesthesia Type: Epidural Level of consciousness: oriented and awake and alert Pain management: pain level controlled Vital Signs Assessment: post-procedure vital signs reviewed and stable Respiratory status: spontaneous breathing and respiratory function stable Cardiovascular status: blood pressure returned to baseline and stable Postop Assessment: no headache, no backache, no apparent nausea or vomiting and patient able to bend at knees Anesthetic complications: no    Last Vitals:  Vitals:   05/21/19 2245 05/21/19 2311  BP:  105/72  Pulse: 80 78  Resp: 20 18  Temp: 36.9 C 36.8 C  SpO2: 97% 95%    Last Pain:  Vitals:   05/21/19 2311  TempSrc: Axillary  PainSc: 4    Pain Goal: Patients Stated Pain Goal: 3 (05/21/19 2130)                 Lannie Fields

## 2019-05-22 NOTE — Progress Notes (Signed)
Pt taken to OR, consent signed. Husband updated   Patrica Duel, RN 05/22/2019 3:54 AM

## 2019-05-22 NOTE — Transfer of Care (Signed)
Immediate Anesthesia Transfer of Care Note  Patient: Brittany Johnston  Procedure(s) Performed: LAPAROTOMY (N/A ) CYSTOSCOPY (N/A )  Patient Location: PACU  Anesthesia Type:General  Level of Consciousness: awake, alert , oriented and patient cooperative  Airway & Oxygen Therapy: Patient Spontanous Breathing and Patient connected to nasal cannula oxygen  Post-op Assessment: Report given to RN and Post -op Vital signs reviewed and stable  Post vital signs: Reviewed and stable  Last Vitals:  Vitals Value Taken Time  BP 86/50 05/22/19 0531  Temp    Pulse 81 05/22/19 0534  Resp 22 05/22/19 0534  SpO2 100 % 05/22/19 0534  Vitals shown include unvalidated device data.  Last Pain:  Vitals:   05/22/19 0315  TempSrc: Oral  PainSc: 2       Patients Stated Pain Goal: 3 (05/21/19 2130)  Complications: No apparent anesthesia complications

## 2019-05-22 NOTE — Op Note (Signed)
PREOPERATIVE DIAGNOSES: 1. Suspected postoperative bleeding s/p c section  POSTOPERATIVE DIAGNOSES: 1. Same  PROCEDURE PERFORMED: 1. Laparotomy. 2. Exam under anethesia 3. Cystoscopy  ANESTHESIA: General  ESTIMATED BLOOD LOSS: 200cc  URINE OUTPUT: 200 cc, clear at the end of the procedure.  DRAINS: Foley catheter to gravity.  COMPLICATIONS: None.  FINDINGS: On arrival to OR, continued to have steady bleeding from incision.  Bimanual exam with intact cervix and no active vaginal bleeding.  Intraabdominal, extensive generalized oozing in subcutaneous tissue. Both ovaries and tubes appeared within normal limits.  INDICATIONS: generalized persistent oozing with associated abdominal distention postop C section. Hgb 10.7, fibrinogen WNL at 465, PLTs WNL.   PROCEDURE: The patient was taken to the operating room where she was prepped and draped in the normal sterile fashion in the dorsal lithotomy position.   After the general anesthetic was found to be adequate, vagina was prepped. Above bimanual exam findings. Attention turned to the abdomen and a mini-lap Pfannenstiel skin incision was made and carried down to the underlying layer of fasica. Generalized oozing throughout noted of approx 100cc. Subcutaneous stitches cut. The fascial stitch was cut and removed. ~ 100cc of blood evacuated. Generalized oozing on uterine serosa noted and cauterized. Abdomen inspected thoroghly - uterus exteriorized without any posterior bleeding. Tubes and ovaries without bleeding No bleeding in the gutters. Bladder flap hemostatic. Uterus incision hemostatic. No evidence of hematoma anywhere.  Abdomen inspected meticulously and noted to be completely hemostatic and without evidence of continued bleeding.   Attention turned to below. Cystoscopy performed with normal bladder survey - bladder completely intact without spillage into abdominal cavity nor any defects noted on cysto. Bilateral brisk ureteral jets  noted.   Attention turned back to above.  Then the fascia was closed with #0 Vicryl in a running fashion. Next, the subcutaneous tissues was reapproximated with 2'0 plain gut in 3 interrupted stitches, and the skin was closed with #4-0 undyed Vicryl in a running subcuticular fashion. The incision was then dressed and bandaged appropriately. After the patient was cleaned, she was taken to Recovery in stable condition and she will be followed for her immediate postoperative period during the hospital. The intraoperative findings were discussed with her family. Plan for CBC 4 hours postop to ensure continued stability.    Rosie Fate MD

## 2019-05-23 ENCOUNTER — Other Ambulatory Visit (HOSPITAL_COMMUNITY): Admission: RE | Admit: 2019-05-23 | Payer: 59 | Source: Ambulatory Visit

## 2019-05-23 NOTE — Progress Notes (Signed)
POD # 2  Doing well No complaints  BP 99/60 (BP Location: Right Arm)   Pulse 74   Temp 98 F (36.7 C)   Resp 18   Ht 5' 4.5" (1.638 m)   Wt 90.3 kg   LMP 08/16/2018   SpO2 99%   Breastfeeding Unknown   BMI 33.63 kg/m  Results for orders placed or performed during the hospital encounter of 05/21/19 (from the past 24 hour(s))  CBC     Status: Abnormal   Collection Time: 05/22/19  8:52 AM  Result Value Ref Range   WBC 21.9 (H) 4.0 - 10.5 K/uL   RBC 3.21 (L) 3.87 - 5.11 MIL/uL   Hemoglobin 9.7 (L) 12.0 - 15.0 g/dL   HCT 94.1 (L) 74.0 - 81.4 %   MCV 91.9 80.0 - 100.0 fL   MCH 30.2 26.0 - 34.0 pg   MCHC 32.9 30.0 - 36.0 g/dL   RDW 48.1 85.6 - 31.4 %   Platelets 277 150 - 400 K/uL   nRBC 0.0 0.0 - 0.2 %   Abdomen soft and non tender Bandage clean and dry  POD # 2  C Section and then return to OR for bleeding Doing well Routine care

## 2019-05-23 NOTE — Progress Notes (Signed)
Pressure dressing removed.  Honey comb clean, dry, intact.  [name] was educated by RN about dressing maintenance and later removal.

## 2019-05-24 ENCOUNTER — Telehealth: Payer: Self-pay | Admitting: *Deleted

## 2019-05-24 ENCOUNTER — Other Ambulatory Visit (INDEPENDENT_AMBULATORY_CARE_PROVIDER_SITE_OTHER): Payer: Self-pay

## 2019-05-24 ENCOUNTER — Other Ambulatory Visit: Payer: Self-pay | Admitting: Neurology

## 2019-05-24 ENCOUNTER — Other Ambulatory Visit: Payer: Self-pay | Admitting: *Deleted

## 2019-05-24 DIAGNOSIS — G35 Multiple sclerosis: Secondary | ICD-10-CM

## 2019-05-24 DIAGNOSIS — Z79899 Other long term (current) drug therapy: Secondary | ICD-10-CM

## 2019-05-24 DIAGNOSIS — Z0289 Encounter for other administrative examinations: Secondary | ICD-10-CM

## 2019-05-24 MED ORDER — IBUPROFEN 600 MG PO TABS: 600.0000 mg | ORAL_TABLET | Freq: Four times a day (QID) | ORAL | 1 refills | Status: DC | PRN

## 2019-05-24 MED ORDER — ACETAMINOPHEN 500 MG PO TABS: 1000.0000 mg | ORAL_TABLET | Freq: Four times a day (QID) | ORAL | 1 refills | Status: DC | PRN

## 2019-05-24 MED ORDER — OXYCODONE HCL 5 MG PO TABS: 5.0000 mg | ORAL_TABLET | Freq: Four times a day (QID) | ORAL | 0 refills | Status: DC | PRN

## 2019-05-24 MED ORDER — PREDNISONE 50 MG PO TABS: ORAL_TABLET | ORAL | 0 refills | Status: DC

## 2019-05-24 MED ORDER — DULCOLAX 5 MG PO TBEC: 5.0000 mg | DELAYED_RELEASE_TABLET | Freq: Every day | ORAL | 1 refills | Status: DC | PRN

## 2019-05-24 NOTE — Discharge Instructions (Signed)
Call office for post operative check in 1 week

## 2019-05-24 NOTE — Telephone Encounter (Signed)
Placed JCV lab in [facility] lock box for routine lab pick up. Results pending. 

## 2019-05-24 NOTE — Progress Notes (Signed)
Notified Dr. Henderson Cloud about pt c/o strange, new sensation to Right tip of tongue.  Pt states she noticed it while brushing her teeth and also while eating breakfast.  She stated it felt like her tongue was "strained and not able to completely stick it out". She has never had this sensation in the past.  She states that she notified her Neurologist (At [facility]) and he does not think it is related to MS since it only affects one side/part of her tongue and that he (Neurologist) could possibly prescribe her Prednisone to see if it helped any.

## 2019-05-24 NOTE — Discharge Summary (Signed)
Obstetric Discharge Summary Reason for Admission: onset of labor Prenatal Procedures: none Intrapartum Procedures: cesarean: low cervical, transverse Postpartum Procedures: Exploratory laparotomy Complications-Operative and Postpartum: post operative intra abdominal bleeding Hemoglobin  Date Value Ref Range Status  05/22/2019 9.7 (L) 12.0 - 15.0 g/dL Final  43/60/6770 34.0 11.1 - 15.9 g/dL Final   HCT  Date Value Ref Range Status  05/22/2019 29.5 (L) 36.0 - 46.0 % Final   Hematocrit  Date Value Ref Range Status  02/12/2019 35.9 34.0 - 46.6 % Final    Physical Exam:  General: alert, cooperative and no distress Lochia: appropriate Uterine Fundus: firm Incision: healing well DVT Evaluation: No evidence of DVT seen on physical exam.  Discharge Diagnoses: Term Pregnancy-delivered  Discharge Information: Date: 05/24/2019 Activity: pelvic rest Diet: routine Medications: PNV, Ibuprofen and Tylenol, oxycodone Condition: stable Instructions: refer to practice specific booklet Discharge to: home   Newborn Data: Live born female  Birth Weight: 8 lb 2.7 oz (3705 g) APGAR: 8, 9  Newborn Delivery   Birth date/time: 05/21/2019 20:06:00 Delivery type: C-Section, Low Transverse Trial of labor: No C-section categorization: Primary      Home with mother.  Roselle Locus II 05/24/2019, 7:46 AM

## 2019-05-24 NOTE — Progress Notes (Signed)
Spoke to patient over nurse's speaker phone. She denied HA, vision change, weakness or any other unilateral symptom. Her speech sounded normal. Her neurologist apparently said that it is not an obvious MS sxs. If she gets numbness on her face she was instructed by the neurologist to call them back. I D/W patient options. She is comfortable going home and monitoring sxs. If anything is worse she will call.

## 2019-05-25 ENCOUNTER — Inpatient Hospital Stay (HOSPITAL_COMMUNITY): Payer: No Typology Code available for payment source

## 2019-05-25 ENCOUNTER — Inpatient Hospital Stay (HOSPITAL_COMMUNITY)
Admission: AD | Admit: 2019-05-25 | Payer: No Typology Code available for payment source | Source: Home / Self Care | Admitting: Obstetrics & Gynecology

## 2019-05-25 LAB — BPAM RBC
Blood Product Expiration Date: 202102122359
Blood Product Expiration Date: 202102122359
Unit Type and Rh: 5100
Unit Type and Rh: 5100

## 2019-05-25 LAB — CBC WITH DIFFERENTIAL/PLATELET
Basophils Absolute: 0 10*3/uL (ref 0.0–0.2)
Basos: 0 %
EOS (ABSOLUTE): 0.4 10*3/uL (ref 0.0–0.4)
Eos: 3 %
Hematocrit: 28.3 % — ABNORMAL LOW (ref 34.0–46.6)
Hemoglobin: 9.3 g/dL — ABNORMAL LOW (ref 11.1–15.9)
Immature Grans (Abs): 0.1 10*3/uL (ref 0.0–0.1)
Immature Granulocytes: 1 %
Lymphocytes Absolute: 2.5 10*3/uL (ref 0.7–3.1)
Lymphs: 22 %
MCH: 29.7 pg (ref 26.6–33.0)
MCHC: 32.9 g/dL (ref 31.5–35.7)
MCV: 90 fL (ref 79–97)
Monocytes Absolute: 0.5 10*3/uL (ref 0.1–0.9)
Monocytes: 4 %
Neutrophils Absolute: 7.9 10*3/uL — ABNORMAL HIGH (ref 1.4–7.0)
Neutrophils: 70 %
Platelets: 344 10*3/uL (ref 150–450)
RBC: 3.13 x10E6/uL — ABNORMAL LOW (ref 3.77–5.28)
RDW: 12.8 % (ref 11.7–15.4)
WBC: 11.3 10*3/uL — ABNORMAL HIGH (ref 3.4–10.8)

## 2019-05-25 LAB — TYPE AND SCREEN
ABO/RH(D): O POS
Antibody Screen: NEGATIVE
Unit division: 0
Unit division: 0

## 2019-05-28 ENCOUNTER — Telehealth: Payer: Self-pay | Admitting: *Deleted

## 2019-05-28 NOTE — Telephone Encounter (Signed)
Gave complete/signed Tysabri start form to [facility] to start processing for pt.

## 2019-05-28 NOTE — Telephone Encounter (Signed)
I called [facility] to check on status of JCV ab results. Results are still pending.

## 2019-06-04 NOTE — Telephone Encounter (Signed)
JCV ab drawn on 05/24/2019 negative, index: 0.13

## 2019-06-04 NOTE — Addendum Note (Signed)
Addended by: Eilene Ghazi L on: 06/04/2019 10:04 AM   Modules accepted: Orders

## 2019-06-05 ENCOUNTER — Encounter: Payer: Self-pay | Admitting: Anesthesiology

## 2019-06-05 NOTE — Addendum Note (Signed)
Addendum  created 06/05/19 0706 by Shelton Silvas, MD   Intraprocedure Event edited

## 2019-06-13 ENCOUNTER — Other Ambulatory Visit: Payer: Self-pay | Admitting: *Deleted

## 2019-06-13 DIAGNOSIS — G35 Multiple sclerosis: Secondary | ICD-10-CM

## 2019-06-13 MED ORDER — TYSABRI 300 MG/15ML IV CONC: 15.0000 mL | INTRAVENOUS | 11 refills | Status: DC

## 2019-06-18 ENCOUNTER — Other Ambulatory Visit: Payer: Self-pay

## 2019-06-18 ENCOUNTER — Encounter: Payer: Self-pay | Admitting: Neurology

## 2019-06-18 ENCOUNTER — Ambulatory Visit: Payer: 59 | Admitting: Neurology

## 2019-06-18 ENCOUNTER — Telehealth: Payer: Self-pay | Admitting: Neurology

## 2019-06-18 VITALS — BP 105/68 | HR 70 | Temp 97.2°F | Ht 64.5 in | Wt 182.0 lb

## 2019-06-18 DIAGNOSIS — R5383 Other fatigue: Secondary | ICD-10-CM | POA: Diagnosis not present

## 2019-06-18 DIAGNOSIS — Z79899 Other long term (current) drug therapy: Secondary | ICD-10-CM

## 2019-06-18 DIAGNOSIS — G35 Multiple sclerosis: Secondary | ICD-10-CM | POA: Diagnosis not present

## 2019-06-18 DIAGNOSIS — E559 Vitamin D deficiency, unspecified: Secondary | ICD-10-CM | POA: Diagnosis not present

## 2019-06-18 NOTE — Progress Notes (Signed)
[facility]  PATIENT: Brittany Johnston DOB: 07/30/1985  REFERRING DOCTOR OR PCP:  [facility])    Fax [phone]  _________________________________   HISTORICAL  CHIEF COMPLAINT:  Chief Complaint  Patient presents with  . Follow-up    RM 13 with mother (temp: 97.8). Last seen 02/12/2019 by AL,NP  . Multiple Sclerosis    On Tysabri. Last JCV ab drawn 05/24/19 negative, 0.13. Has tysabri infusion tomorrow.    HISTORY OF PRESENT ILLNESS:  Brittany Johnston is a 34 y.o. woman with relapsing remitting MS on Tysabri.   Update 06/18/2019: She was on Copaxone during her pregnancy and had a lot of skin reactions.  She will be starting Tysabri and is scheduled to have her first infusion since pregnancy tomorrow.  She is JCV antibody negative with a JCV antibody titer of 0.13.  She tolerated Tysabri very well and had no exacerbations while on it.     She delivered last month (05/21/2019 Brittany Johnston) by C-section and did have a complication with some bleeding requiring a second procedure for cautery.. She and baby are doing well. She has no recent exacerbation.    She denies gait or balance issues.   Strength is good.   She denies current numbness.    She had numbness in her right side of the tongue x 2 weeks right after pregnancy but did have a complication and had a second operation and was intubated.   Vision is fine.   She did have a UTI but bladder ok otherwise.     She notes some fatigue. She is sleeping well.   She denies depression.  Cognition is doing well.     Update 06/30/2018: She is on Tysabri and doing well with no exacerbation and good tolerability.   She is getting married soon and wants to have a family soon.  Her plans are to become pregnant in April or May.  We have discussed pregnancy plans in the past.  She will stay on Tysabri until she becomes pregnant and then switch to Copaxone during the pregnancy and then go back on Tysabri after  delivery.  Gait is doing well.    Strength is good.   She has paresthesias (tight sensation around her neck).   Sometimes fingers feel ice cold   Vision is fine.   Bladder function is fine.   She notes some issues with mild memory loss and sometimes repeats herself.      Adderall has helped  Focus/attention some.    She sometimes has trouble falling asleep.   Her depression is much better and the anxiety is baseline.      Update 12/28/2017:  She is on Tysabri  She was at the beach for [place] days.   She had the onset of tingling in thebottom of the feet that becomes hot when she lays down.     It does not go past the ankles.   She deneis weakness.     She does feel more tired but not sleey.      She gets enough sleep at night.     She feels her mood is fine.  She cries when the feet are hurting more but she notes she is more emotional.   She feels her focus and attention are decreased and she has been forgetful at work.   She was diagnosed with ADD as a teenager and took Adderall in middle and high school.      Update  09/19/17: She is recently engaged and is interested in family planning issues.     She is currently on Tysabri, having started around March 2018.  She tolerates it well and has not had any relapses while she is on it.   Of note, Tysabri was chosen because she has a moderately aggressive MS with 2 spinal cord lesions including one that enhanced consistent with her numbness.  She also has multiple brain foci and optic neuritis.  She is engaged and planning on being married April 2020 and quickly starting a family after that.  We discussed several options.  One would be to continue on Tysabri until she gets pregnant and then stop it.  Another would be to stay on Tysabri and switch to Copaxone when she becomes pregnant.  The third option would be to do cladribine this year and next year and then she should be able to get pregnant around December 2020 or later.  A final option is to  similarly do [place] this year and next year and then she should be able to get pregnant around September or October 2020 or later.  She feels that her MS symptoms are doing well.  She has no difficulty with her gait or balance.  She denies any weakness and rarely feels any numbness.  Region returned to normal.  She continues to note some anxiety and depression but is doing better.  She has some fatigue but this is not bothering her too much.  Update 05/19/2017: She reports more stress and anxiety.   She tried Zoloft but sleep was worse so she stopped.   She has not tried Buspar.    We discussed some changes in medications and if not effective seeing a psychologist or psychiatrist may be of benefit.  Her MS is doing well on Tysabri.  She tolerates it well and has had no exacerbation.    She notes no problems with gait, strength, sensation or bladder.    Vision has returned to normal  She notes some fatigue most days, not much different with heat.     This does not affect her much at work but she is more tired after work and sometimes skips social events.   She sleeps well in general (was worse with Zoloft).  From 01/06/2017 Mood:   She is having a lot more depression and anxiety, triggered (she thinks) by other friends moving on with their life and her having all these health issues (besides MS needed cervical LEEP procedure and close monitoring and multiple moles removed-one possibly a melanoma).  She also has a lot of stress at work.     She has frequent crying spells.   We had a long discussion about her mood, triggers, relationship to medical issues therapy, etc   MS:   She continues on Tysabri. She tolerates it well. She is JCV antibody negative (last tested July 2018). She has not had any further exacerbations. Vision is essentially back to normal and her gait and sensation are practically baseline per   _____________________________________ From 11/16/2016  MS:   She started Tysabri in April  2018.   She is tolerating it well. She sometimes feels a little bit more tired the last week of each one month cycle. She was JCV antibody negative when tested in February. We will retest today.  Gait/strength/sensation: She denies any significant problem with her gait or strength. She did have some twitching in the right biceps and deltoid shortly after diagnosis and before starting  Tysabri and some slight numbness in the right hand at the same time. The symptoms lasted about a week and have not returned.  Vision: She feels her vision is back to normal now.   She had left optic neuritis January 2018.  Bladder: She denies any difficulty with urinary frequency, urgency or hesitancy.  Mood/cognition:  She has not had any recent trouble with depression or anxiety. She had some mild depression and anxiety around the time of diagnosis but that has improved.   She denies any significant problems with cognitive function.  Fatigue/sleep:    She notes mild fatigue but it does not get in the way of her performing necessary activities of daily living. She sleeps well most nights.    She grinds her teeth at night and notes some TMJ-like pain on the right if she opens her mouth wide that preceded the current symptoms.   ENT:  She had severe otitis externa.   A combination of antibiotics and drops hekpled a lot.     MS History  Around 05/20/2016, she had the onset of left eye pain, worse with movement.   Symptoms started while driving and she noted looking to merge was painful. She saw her ophthalmologist and she had an MRI 06/03/2016 showing optic nerve enhancement on the left and a second focus in the right hemisphere that also enhanced.     She noted mild blurry vision shortly after that but it seemed worse 06/05/2016 and the next day, vision was worse.    She went to the [facility] ED and was admitted for 5 days of IV Solu-Medrol (3 days in hospital, 2 as outpatient).  There, she had an MRI of the cervical spine that  showed 2 more lesions (at C4C5 and T1) consistent with MS.  In retrospect, a few weeks before the visual symptoms, she noted right hand numbness, especially if she hold her phone which is persisting.     I have reviewed the MRI of the brain dated 06/03/2016 and the MRI of the cervical spine dated 06/08/2016.   The MRI of the brain showed enhancement of the left optic nerve. Additionally in the right temporal lobes there is another T2/FLAIR hyperintense focus that enhances. Additionally there is a periventricular right cerebellar focus that does not enhance. I also reviewed the MRI of the cervical spine. At C3-C4 there is a right lateral focus with subtle enhancement. There is also a right posterolateral focus adjacent to T1 that does not enhance.   Lab reports from her recent hospital stay were also evaluated. He is JCV antibody negative. Anti-NMO is negative.  She started Tysabri in March 2018.Marland Kitchen      REVIEW OF SYSTEMS: Constitutional: No fevers, chills, sweats, or change in appetite.   She denies much fatigue. Eyes: No visual changes, double vision, eye pain Ear, nose and throat: No hearing loss, ear pain, nasal congestion, sore throat Cardiovascular: No chest pain, palpitations Respiratory: No shortness of breath at rest or with exertion.   No wheezes GastrointestinaI: No nausea, vomiting, diarrhea, abdominal pain, fecal incontinence,    Has constipation Genitourinary: No dysuria, urinary retention or frequency.  No nocturia.  Has had some yeast infections.  Musculoskeletal: No neck pain, back pain Integumentary: No rash, pruritus, skin lesions Neurological: as above Psychiatric: Mood is doing well. Endocrine: No palpitations, diaphoresis, change in appetite, change in weigh or increased thirst Hematologic/Lymphatic: No anemia, purpura, petechiae. Allergic/Immunologic: No itchy/runny eyes, nasal congestion, recent allergic reactions, rashes  ALLERGIES: No  Known Allergies  HOME  MEDICATIONS:  Current Outpatient Medications:  .  natalizumab (TYSABRI) 300 MG/15ML injection, Inject 15 mLs (300 mg total) into the vein every 28 (twenty-eight) days., Disp: 15 mL, Rfl: 11 .  Prenatal w/o A Vit-Fe Cbn-FA (PRENATABS OBN PO), Take 1 tablet by mouth daily. , Disp: , Rfl:   PAST MEDICAL HISTORY: Past Medical History:  Diagnosis Date  . Multiple sclerosis ([place])   . Vision abnormalities     PAST SURGICAL HISTORY: Past Surgical History:  Procedure Laterality Date  . CESAREAN SECTION N/A 05/21/2019   Procedure: CESAREAN SECTION;  Surgeon: Tyson Dense, MD;  Location: [facility] LD ORS;  Service: Obstetrics;  Laterality: N/A;  . CYSTOSCOPY N/A 05/22/2019   Procedure: CYSTOSCOPY;  Surgeon: Tyson Dense, MD;  Location: [facility] LD ORS;  Service: Gynecology;  Laterality: N/A;  . LAPAROTOMY N/A 05/22/2019   Procedure: LAPAROTOMY;  Surgeon: Tyson Dense, MD;  Location: [place] LD ORS;  Service: Gynecology;  Laterality: N/A;  . LEEP     suppose to have follow up of cervical cells in January  . MYRINGOTOMY    . WISDOM TOOTH EXTRACTION      FAMILY HISTORY: Family History  Problem Relation Age of Onset  . Heart disease Mother   . Healthy Father   . Diabetes Maternal Grandmother   . Leukemia Paternal Grandfather     SOCIAL HISTORY:  Social History   Socioeconomic History  . Marital status: Married    Spouse name: Not on file  . Number of children: Not on file  . Years of education: Not on file  . Highest education level: Not on file  Occupational History  . Not on file  Tobacco Use  . Smoking status: Never Smoker  . Smokeless tobacco: Never Used  Substance and Sexual Activity  . Alcohol use: Yes    Comment: occasional  . Drug use: No  . Sexual activity: Yes    Birth control/protection: None  Other Topics Concern  . Not on file  Social History Narrative  . Not on file   Social Determinants of Health   Financial Resource Strain:   . Difficulty of  Paying Living Expenses: Not on file  Food Insecurity:   . Worried About Charity fundraiser in the Last Year: Not on file  . Ran Out of Food in the Last Year: Not on file  Transportation Needs:   . Lack of Transportation (Medical): Not on file  . Lack of Transportation (Non-Medical): Not on file  Physical Activity:   . Days of Exercise per Week: Not on file  . Minutes of Exercise per Session: Not on file  Stress:   . Feeling of Stress : Not on file  Social Connections:   . Frequency of Communication with Friends and Family: Not on file  . Frequency of Social Gatherings with Friends and Family: Not on file  . Attends Religious Services: Not on file  . Active Member of Clubs or Organizations: Not on file  . Attends Archivist Meetings: Not on file  . Marital Status: Not on file  Intimate Partner Violence:   . Fear of Current or Ex-Partner: Not on file  . Emotionally Abused: Not on file  . Physically Abused: Not on file  . Sexually Abused: Not on file     PHYSICAL EXAM  BP 105/68 (BP Location: Right Arm, Patient Position: Sitting, Cuff Size: Normal)   Pulse 70   Temp (!) 97.2 F (  36.2 C)   Ht 5' 4.5" (1.638 m)   Wt 182 lb (82.6 kg)   LMP 08/16/2018   SpO2 99%   BMI 30.76 kg/m    Body mass index is 30.76 kg/m.   General: The patient is well-developed and well-nourished and in no acute distress   HEENT:   Head is normocephalic and atraumatic.  The ear is no longer tender  Neurologic Exam  Mental status: She has a good affect.. The patient is alert and oriented x 3 at the time of the examination. The patient has apparent normal recent and remote memory, with an apparently normal attention span and concentration ability.   Speech is normal.  Cranial nerves: Extraocular movements are full.  Facial strength and sensation is normal.  Trapezius strength is normal.  No obvious hearing deficits are noted.  Motor:  Muscle bulk is normal.   Tone is normal. Strength  is  5 / 5 in all 4 extremities.   Sensory: Normal sensation to touch and vibration in the arms and legs.   Gait and station: Station is normal.   Gait and tandem gait are normal.    Reflexes: Deep tendon reflexes are symmetric and normal bilaterally.       DIAGNOSTIC DATA (LABS, IMAGING, TESTING) - I reviewed patient records, labs, notes, testing and imaging myself where available.  Lab Results  Component Value Date   WBC 11.3 (H) 05/24/2019   HGB 9.3 (L) 05/24/2019   HCT 28.3 (L) 05/24/2019   MCV 90 05/24/2019   PLT 344 05/24/2019      Component Value Date/Time   NA 133 (L) 05/22/2019 0409   NA 137 02/12/2019 0850   K 4.1 05/22/2019 0409   CL 104 04/27/2019 1804   CO2 23 04/27/2019 1804   GLUCOSE 73 04/27/2019 1804   BUN 5 (L) 04/27/2019 1804   BUN 7 02/12/2019 0850   CREATININE 0.54 04/27/2019 1804   CALCIUM 9.0 04/27/2019 1804   PROT 5.7 (L) 04/27/2019 1804   PROT 5.9 (L) 02/12/2019 0850   ALBUMIN 2.7 (L) 04/27/2019 1804   ALBUMIN 3.5 (L) 02/12/2019 0850   AST 19 04/27/2019 1804   ALT 19 04/27/2019 1804   ALKPHOS 117 04/27/2019 1804   BILITOT 0.5 04/27/2019 1804   BILITOT <0.2 02/12/2019 0850   GFRNONAA >60 04/27/2019 1804   GFRAA >60 04/27/2019 1804       ASSESSMENT AND PLAN  Relapsing remitting multiple sclerosis (HCC)  Multiple sclerosis (HCC) - Plan: MR BRAIN W WO CONTRAST  High risk medication use  Vitamin D deficiency  Other fatigue    1.     Continue Tysabri for now.  We discussed the possibility of switching to [facility] as she would have a large window to have a second child 18 months after starting the therapy.  We went over the pros and cons including the data showing possible increased risk of cancer.  She does know that it would be important not to become pregnant within 6 months after any of the doses.I do have some concern that there could be higher risk of complications from COVID-19 and possibly less response to vaccination during the  few months after each dose.  Therefore, I would prefer her not to switch until after she gets vaccinated against COVID-19.   2.  We will need to check an MRI of the brain to determine if she is having subclinical progression of the MS and modify treatment based on the results.  3.  Stay active and exercise as tolerated.   4.  Return in 5-6 months or sooner if there are new or worsening neurologic symptoms and depending on options of treatment.       Aradia Estey A. Epimenio Foot, MD, PhD 06/18/2019, 5:11 PM   Certified in Neurology, Clinical Neurophysiology, Sleep Medicine, Pain Medicine and Neuroimaging  [facility] [address], [address] [place], [place] [address] [phone]

## 2019-06-18 NOTE — Telephone Encounter (Signed)
[employer] order sent to GI. They will obtain the auth and reach out to the patient to schedule.  

## 2019-06-19 ENCOUNTER — Telehealth: Payer: Self-pay | Admitting: *Deleted

## 2019-06-19 NOTE — Telephone Encounter (Signed)
Received fax from touch prescribing program that pt re-authorized for Tysabri from 06/19/19-12/18/19. Pt enrollment number: [id number]. Account: [employer]. Site auth number: [id number].

## 2019-07-09 ENCOUNTER — Ambulatory Visit
Admission: RE | Admit: 2019-07-09 | Discharge: 2019-07-09 | Disposition: A | Discharge status: Home / Self Care | Payer: 59 | Source: Ambulatory Visit | Attending: Neurology | Admitting: Neurology

## 2019-07-09 ENCOUNTER — Other Ambulatory Visit: Payer: Self-pay

## 2019-07-09 DIAGNOSIS — G35 Multiple sclerosis: Secondary | ICD-10-CM

## 2019-07-09 MED ORDER — GADOBENATE DIMEGLUMINE 529 MG/ML IV SOLN
17.0000 mL | Freq: Once | INTRAVENOUS | Status: AC | PRN
  Administered 2019-07-09: 11:00:00 17 mL via INTRAVENOUS

## 2019-07-25 ENCOUNTER — Telehealth: Payer: Self-pay | Admitting: *Deleted

## 2019-07-25 NOTE — Telephone Encounter (Signed)
Faxed completed/signed PA Tysabri form back to [facility] at [phone]. Received fax confirmation. Waiting on determination.

## 2019-07-30 ENCOUNTER — Other Ambulatory Visit: Payer: Self-pay | Admitting: *Deleted

## 2019-07-30 MED ORDER — PHENTERMINE HCL 30 MG PO CAPS: 30.0000 mg | ORAL_CAPSULE | ORAL | 5 refills | Status: DC

## 2019-08-01 NOTE — Telephone Encounter (Signed)
Received fax from [facility] that PA approved 07/31/2019-07/30/2020. PA#[employer] [id number]. Provided copy to [facility] and sent copy to be scanned to epic.

## 2019-11-19 ENCOUNTER — Telehealth: Payer: Self-pay | Admitting: *Deleted

## 2019-11-19 NOTE — Telephone Encounter (Signed)
Faxed completed/signed Tysabri pt status report and reauth questionnaire to [facility] at [phone]. Received confirmation.   Received fax from touch prescribing program that pt re-authorized for Tysabri from 11/19/19-06/18/20. Pt enrollment number: [id number]. Account: [facility]. Site auth number: [id number].

## 2019-12-17 ENCOUNTER — Telehealth: Payer: Self-pay | Admitting: *Deleted

## 2019-12-17 ENCOUNTER — Other Ambulatory Visit: Payer: Self-pay | Admitting: *Deleted

## 2019-12-17 DIAGNOSIS — G35 Multiple sclerosis: Secondary | ICD-10-CM

## 2019-12-17 DIAGNOSIS — Z79899 Other long term (current) drug therapy: Secondary | ICD-10-CM

## 2019-12-17 NOTE — Addendum Note (Signed)
Addended by: Tamera Stands D on: 12/17/2019 10:06 AM   Modules accepted: Orders

## 2019-12-17 NOTE — Telephone Encounter (Signed)
Placed JCV lab in [facility] lock box for routine lab pick up. Results pending. 

## 2019-12-18 ENCOUNTER — Encounter: Payer: Self-pay | Admitting: Neurology

## 2019-12-18 ENCOUNTER — Ambulatory Visit: Payer: No Typology Code available for payment source | Admitting: Neurology

## 2019-12-18 ENCOUNTER — Other Ambulatory Visit: Payer: Self-pay

## 2019-12-18 VITALS — BP 107/65 | HR 67 | Ht 64.5 in | Wt 169.5 lb

## 2019-12-18 DIAGNOSIS — G35 Multiple sclerosis: Secondary | ICD-10-CM

## 2019-12-18 DIAGNOSIS — R200 Anesthesia of skin: Secondary | ICD-10-CM | POA: Diagnosis not present

## 2019-12-18 DIAGNOSIS — Z79899 Other long term (current) drug therapy: Secondary | ICD-10-CM | POA: Diagnosis not present

## 2019-12-18 DIAGNOSIS — H469 Unspecified optic neuritis: Secondary | ICD-10-CM | POA: Diagnosis not present

## 2019-12-18 LAB — CBC WITH DIFFERENTIAL/PLATELET
Basophils Absolute: 0 x10E3/uL (ref 0.0–0.2)
Basos: 0 %
EOS (ABSOLUTE): 0.1 x10E3/uL (ref 0.0–0.4)
Eos: 2 %
Hematocrit: 36.6 % (ref 34.0–46.6)
Hemoglobin: 11.9 g/dL (ref 11.1–15.9)
Immature Grans (Abs): 0 x10E3/uL (ref 0.0–0.1)
Immature Granulocytes: 0 %
Lymphocytes Absolute: 2.2 x10E3/uL (ref 0.7–3.1)
Lymphs: 51 %
MCH: 27.1 pg (ref 26.6–33.0)
MCHC: 32.5 g/dL (ref 31.5–35.7)
MCV: 83 fL (ref 79–97)
Monocytes Absolute: 0.4 x10E3/uL (ref 0.1–0.9)
Monocytes: 9 %
Neutrophils Absolute: 1.7 x10E3/uL (ref 1.4–7.0)
Neutrophils: 38 %
Platelets: 163 x10E3/uL (ref 150–450)
RBC: 4.39 x10E6/uL (ref 3.77–5.28)
RDW: 13.9 % (ref 11.7–15.4)
WBC: 4.4 x10E3/uL (ref 3.4–10.8)

## 2019-12-18 NOTE — Progress Notes (Signed)
[facility]  PATIENT: Brittany Johnston DOB: 07-08-1985  REFERRING DOCTOR OR PCP:  Aura Camps [facility])    Fax [phone]  _________________________________   HISTORICAL  CHIEF COMPLAINT:  Chief Complaint  Patient presents with  . Follow-up    RM 13, alone. Last seen 06/18/2019. Denies any new sx.  No concerns at this time.  . Multiple Sclerosis    On Tysabri. Last: 12/17/2019. Next: 01/23/20. Last JCV collected on 12/17/19. Results pending.    HISTORY OF PRESENT ILLNESS:  Brittany Johnston is a 34 y.o. woman with relapsing remitting MS on Tysabri.   Update 8/17//2021: She is on Tysabri and tolerates it well.   She.  She is JCV antibody negative with a JCV antibody titer of 0.13.  She had labs yesterday and JCV is pending.  She tolerated Tysabri very well and had no exacerbations while on it.     She delivered last month (05/21/2019 [name]) by C-section and did have a complication with some bleeding requiring a second procedure for cautery.. She and baby are doing well. She has no recent exacerbation.    She denies gait or balance issues.   Strength is good.   She denies current numbness.    Vision is fine.   She notes that she is not always getting the urge to urinate but will go a normal amount  She notes some fatigue. She is sleeping well.   She denies depression.  Cognition is doing well.    She had Covid-19 last month and she is recovered.   She has not yet had the vaccination.         MS History   Around 05/20/2016, she had the onset of left eye pain, worse with movement.   Symptoms started while driving and she noted looking to merge was painful. She saw her ophthalmologist and she had an MRI 06/03/2016 showing optic nerve enhancement on the left and a second focus in the right hemisphere that also enhanced.     She noted mild blurry vision shortly after that but it seemed worse 06/05/2016 and the next day, vision was worse.    She went to the [facility] ED  and was admitted for 5 days of IV Solu-Medrol (3 days in hospital, 2 as outpatient).  There, she had an MRI of the cervical spine that showed 2 more lesions (at C4C5 and T1) consistent with MS.  In retrospect, a few weeks before the visual symptoms, she noted right hand numbness, especially if she hold her phone which is persisting.     I have reviewed the MRI of the brain dated 06/03/2016 and the MRI of the cervical spine dated 06/08/2016.   The MRI of the brain showed enhancement of the left optic nerve. Additionally in the right temporal lobes there is another T2/FLAIR hyperintense focus that enhances. Additionally there is a periventricular right cerebellar focus that does not enhance. I also reviewed the MRI of the cervical spine. At C3-C4 there is a right lateral focus with subtle enhancement. There is also a right posterolateral focus adjacent to T1 that does not enhance.   Lab reports from her recent hospital stay were also evaluated. He is JCV antibody negative. Anti-NMO is negative.  She started Tysabri in March 2018.Marland Kitchen   She was started on Tysabri and did well.  She discontinued Tysabri summer 2020 after becoming pregnant.  She started Copaxone.  MRI of the brain after delivery March 2021 showed one new  lesion that was not there previously.  She went back on Tysabri last month.  Imaging studies: MRI of the brain 07/09/2019 shows T2/flair hyperintense foci in the right cerebellar hemisphere and in the cerebral hemispheres in a pattern configuration consistent with chronic demyelinating plaque associated with multiple sclerosis.  None of the foci enhance or appear to be acute.  One focus in the right temporal lobe was not apparent on the previous MRI from 08/16/2018.  I of the brain 08/16/2018 shows T2/flair hyperintense focus adjacent to the fourth ventricle in the right cerebellum and a couple smaller foci in the periventricular deep white matter of the hemispheres consistent with chronic demyelinating  plaque associated with multiple sclerosis.  When compared to the MRI dated 09/21/2017, there are no new lesions.  MRI of the cervical spine 09/21/2017 shows small foci within the spinal cord posteriorly adjacent to C2-C3, to the right adjacent to C4 and posteriorly to the right adjacent to T1.  All these foci were present on the 06/08/2016 MRI and were more apparent on the previous MRI than the current scan.  These are consistent with small chronic demyelinating plaques associated with multiple sclerosis   REVIEW OF SYSTEMS: Constitutional: No fevers, chills, sweats, or change in appetite.   She denies much fatigue. Eyes: No visual changes, double vision, eye pain Ear, nose and throat: No hearing loss, ear pain, nasal congestion, sore throat Cardiovascular: No chest pain, palpitations Respiratory: No shortness of breath at rest or with exertion.   No wheezes GastrointestinaI: No nausea, vomiting, diarrhea, abdominal pain, fecal incontinence,    Has constipation Genitourinary: No dysuria, urinary retention or frequency.  No nocturia.  Has had some yeast infections.  Musculoskeletal: No neck pain, back pain Integumentary: No rash, pruritus, skin lesions Neurological: as above Psychiatric: Mood is doing well. Endocrine: No palpitations, diaphoresis, change in appetite, change in weigh or increased thirst Hematologic/Lymphatic: No anemia, purpura, petechiae. Allergic/Immunologic: No itchy/runny eyes, nasal congestion, recent allergic reactions, rashes  ALLERGIES: No Known Allergies  HOME MEDICATIONS:  Current Outpatient Medications:  .  natalizumab (TYSABRI) 300 MG/15ML injection, Inject 15 mLs (300 mg total) into the vein every 28 (twenty-eight) days., Disp: 15 mL, Rfl: 11  PAST MEDICAL HISTORY: Past Medical History:  Diagnosis Date  . Multiple sclerosis (HCC)   . Vision abnormalities     PAST SURGICAL HISTORY: Past Surgical History:  Procedure Laterality Date  . CESAREAN SECTION  N/A 05/21/2019   Procedure: CESAREAN SECTION;  Surgeon: Ranae Pila, MD;  Location: [facility] LD ORS;  Service: Obstetrics;  Laterality: N/A;  . CYSTOSCOPY N/A 05/22/2019   Procedure: CYSTOSCOPY;  Surgeon: Ranae Pila, MD;  Location: [facility] LD ORS;  Service: Gynecology;  Laterality: N/A;  . LAPAROTOMY N/A 05/22/2019   Procedure: LAPAROTOMY;  Surgeon: Ranae Pila, MD;  Location: [facility] LD ORS;  Service: Gynecology;  Laterality: N/A;  . LEEP     suppose to have follow up of cervical cells in January  . MYRINGOTOMY    . WISDOM TOOTH EXTRACTION      FAMILY HISTORY: Family History  Problem Relation Age of Onset  . Heart disease Mother   . Healthy Father   . Diabetes Maternal Grandmother   . Leukemia Paternal Grandfather     SOCIAL HISTORY:  Social History   Socioeconomic History  . Marital status: Married    Spouse name: Not on file  . Number of children: Not on file  . Years of education: Not on file  .  Highest education level: Not on file  Occupational History  . Not on file  Tobacco Use  . Smoking status: Never Smoker  . Smokeless tobacco: Never Used  Substance and Sexual Activity  . Alcohol use: Yes    Comment: occasional  . Drug use: No  . Sexual activity: Yes    Birth control/protection: None  Other Topics Concern  . Not on file  Social History Narrative  . Not on file   Social Determinants of Health   Financial Resource Strain:   . Difficulty of Paying Living Expenses:   Food Insecurity:   . Worried About Programme researcher, broadcasting/film/video in the Last Year:   . Barista in the Last Year:   Transportation Needs:   . Freight forwarder (Medical):   Marland Kitchen Lack of Transportation (Non-Medical):   Physical Activity:   . Days of Exercise per Week:   . Minutes of Exercise per Session:   Stress:   . Feeling of Stress :   Social Connections:   . Frequency of Communication with Friends and Family:   . Frequency of Social Gatherings with Friends and Family:     . Attends Religious Services:   . Active Member of Clubs or Organizations:   . Attends Banker Meetings:   Marland Kitchen Marital Status:   Intimate Partner Violence:   . Fear of Current or Ex-Partner:   . Emotionally Abused:   Marland Kitchen Physically Abused:   . Sexually Abused:      PHYSICAL EXAM  BP 107/65 (BP Location: Right Arm, Patient Position: Sitting, Cuff Size: Normal)   Pulse 67   Ht 5' 4.5" (1.638 m)   Wt 169 lb 8 oz (76.9 kg)   BMI 28.65 kg/m    Body mass index is 28.65 kg/m.   General: The patient is well-developed and well-nourished and in no acute distress   HEENT:   Head is normocephalic and atraumatic.  The ear is no longer tender  Neurologic Exam  Mental status: She has a good affect.. The patient is alert and oriented x 3 at the time of the examination. The patient has apparent normal recent and remote memory, with an apparently normal attention span and concentration ability.   Speech is normal.  Cranial nerves: Extraocular movements are full.  Visual acuity and color vision is symmetric.  Facial strength and sensation is normal.  Trapezius strength is normal.  No obvious hearing deficits are noted.  Motor:  Muscle bulk is normal.   Tone is normal. Strength is  5 / 5 in all 4 extremities.   Sensory: Normal sensation to touch and vibration in the arms and legs.   Gait and station: Station is normal.  Gait and tandem gait are normal.  Reflexes: Deep tendon reflexes are symmetric and normal bilaterally.       DIAGNOSTIC DATA (LABS, IMAGING, TESTING) - I reviewed patient records, labs, notes, testing and imaging myself where available.  Lab Results  Component Value Date   WBC 4.4 12/17/2019   HGB 11.9 12/17/2019   HCT 36.6 12/17/2019   MCV 83 12/17/2019   PLT 163 12/17/2019      Component Value Date/Time   NA 133 (L) 05/22/2019 0409   NA 137 02/12/2019 0850   K 4.1 05/22/2019 0409   CL 104 04/27/2019 1804   CO2 23 04/27/2019 1804   GLUCOSE 73  04/27/2019 1804   BUN 5 (L) 04/27/2019 1804   BUN 7 02/12/2019 0850  CREATININE 0.54 04/27/2019 1804   CALCIUM 9.0 04/27/2019 1804   PROT 5.7 (L) 04/27/2019 1804   PROT 5.9 (L) 02/12/2019 0850   ALBUMIN 2.7 (L) 04/27/2019 1804   ALBUMIN 3.5 (L) 02/12/2019 0850   AST 19 04/27/2019 1804   ALT 19 04/27/2019 1804   ALKPHOS 117 04/27/2019 1804   BILITOT 0.5 04/27/2019 1804   BILITOT <0.2 02/12/2019 0850   GFRNONAA >60 04/27/2019 1804   GFRAA >60 04/27/2019 1804       ASSESSMENT AND PLAN  Multiple sclerosis (HCC)  High risk medication use  Optic neuritis  Numbness    1.    Continue Tysabri.   2.   She had COVID-19 last month.  She had not been vaccinated.  We discussed considering a booster after a few months. 3.   Stay active and exercise as tolerated.   4.  Return in 5-6 months or sooner if there are new or worsening neurologic symptoms and depending on options of treatment.       Undrea Shipes A. Epimenio Foot, MD, PhD 12/18/2019, 5:40 PM   Certified in Neurology, Clinical Neurophysiology, Sleep Medicine, Pain Medicine and Neuroimaging  [facility] [address], [address] [place], [place] [address] [phone]

## 2019-12-24 NOTE — Telephone Encounter (Signed)
JCV ab drawn on 12/17/19 negative, index: 0.15

## 2020-05-07 ENCOUNTER — Telehealth: Payer: Self-pay | Admitting: *Deleted

## 2020-05-07 NOTE — Telephone Encounter (Signed)
Faxed completed/signed Copaxone start form to [employer] at [phone]. Received fax confirmation. 

## 2020-05-07 NOTE — Telephone Encounter (Signed)
Submitted PA Copaxone 40mg  on CMM. Key: . PA Case ID: [id number]. Waiting on determination from [facility].

## 2020-05-08 ENCOUNTER — Other Ambulatory Visit: Payer: Self-pay | Admitting: *Deleted

## 2020-05-08 DIAGNOSIS — G35 Multiple sclerosis: Secondary | ICD-10-CM

## 2020-05-08 MED ORDER — COPAXONE 40 MG/ML ~~LOC~~ SOSY: 40.0000 mg | PREFILLED_SYRINGE | SUBCUTANEOUS | 3 refills | Status: DC

## 2020-05-08 NOTE — Telephone Encounter (Signed)
Received fax from [facility] that PA approved 05/07/20-05/07/21. PA#[employer] [phone]

## 2020-05-28 NOTE — Telephone Encounter (Signed)
Received fax from [facility] [employer] that patient plans to receive/start copaxone on or about 05/26/20.

## 2020-06-02 NOTE — Telephone Encounter (Signed)
Patient returned my call. She has received her Copaxone shipment. Patient has started taking her Copaxone injections and is tolerating it well. She has no questions or concerns regarding medication or injecting herself. Patient will follow-up as scheduled in February.

## 2020-06-02 NOTE — Telephone Encounter (Signed)
I called patient to discuss her Copaxone.  No answer, left a voicemail asking her to call me back.

## 2020-06-23 ENCOUNTER — Ambulatory Visit: Payer: No Typology Code available for payment source | Admitting: Family Medicine

## 2020-06-25 NOTE — Patient Instructions (Incomplete)
Below is our plan:  We will continue Capaxone.  Please make sure you are staying well hydrated. I recommend 50-60 ounces daily. Well balanced diet and regular exercise encouraged. Consistent sleep schedule with 6-8 hours recommended.   Please continue follow up with care team as directed.   Follow up with Dr Epimenio Foot in 6 months   You may receive a survey regarding today's visit. I encourage you to leave honest feed back as I do use this information to improve patient care. Thank you for seeing me today!      Multiple Sclerosis Multiple sclerosis (MS) is a disease of the brain, spinal cord, and optic nerves (central nervous system). It causes the body's disease-fighting (immune) system to destroy the protective covering (myelin sheath) around nerves in the brain. When this happens, signals (nerve impulses) going to and from the brain and spinal cord do not get sent properly or may not get sent at all. There are several types of MS:  Relapsing-remitting MS. This is the most common type. This causes sudden attacks of symptoms. After an attack, you may recover completely until the next attack, or some symptoms may remain permanently.  Secondary progressive MS. This usually develops after the onset of relapsing-remitting MS. Similar to relapsing-remitting MS, this type also causes sudden attacks of symptoms. Attacks may be less frequent, but symptoms slowly get worse (progress) over time.  Primary progressive MS. This causes symptoms that steadily progress over time. This type of MS does not cause sudden attacks of symptoms. The age of onset of MS varies, but it often develops between 47-28 years of age. MS is a lifelong (chronic) condition. There is no cure, but treatment can help slow down the progression of the disease. What are the causes? The cause of this condition is not known. What increases the risk? You are more likely to develop this condition if:  You are a woman.  You have a  relative with MS. However, the condition is not passed from parent to child (inherited).  You have a lack (deficiency) of vitamin D.  You smoke. MS is more common in the [place] than in the [place]. What are the signs or symptoms? Relapsing-remitting and secondary progressive MS cause symptoms to occur in episodes or attacks that may last weeks to months. There may be long periods between attacks in which there are almost no symptoms. Primary progressive MS causes symptoms to steadily progress after they develop. Symptoms of MS vary because of the many different ways it affects the central nervous system. The main symptoms include:  Vision problems and eye pain.  Numbness and weakness.  Inability to move your arms, hands, feet, or legs (paralysis).  Balance problems.  Shaking that you cannot control (tremors).  Muscle spasms.  Problems with thinking (cognitive changes). MS can also cause symptoms that are associated with the disease, but are not always the direct result of an MS attack. They may include:  Inability to control urination or bowel movements (incontinence).  Headaches.  Fatigue.  Inability to tolerate heat.  Emotional changes.  Depression.  Pain. How is this diagnosed? This condition is diagnosed based on:  Your symptoms.  A neurological exam. This involves checking central nervous system function, such as nerve function, reflexes, and coordination.  MRIs of the brain and spinal cord.  Lab tests, including a lumbar puncture that tests the fluid that surrounds the brain and spinal cord (cerebrospinal fluid).  Tests to measure the electrical activity  of the brain in response to stimulation (evoked potentials). How is this treated? There is no cure for MS, but medicines can help decrease the number and frequency of attacks and help relieve nuisance symptoms. Treatment options may include:  Medicines that reduce the frequency  of attacks. These medicines may be given by injection, by mouth (orally), or through an IV.  Medicines that reduce inflammation (steroids). These may provide short-term relief of symptoms.  Medicines to help control pain, depression, fatigue, or incontinence.  Nutritional counseling. Vitamin D supplements, if you have a deficiency.  Using devices to help you move around (assistive devices), such as braces, a cane, or a walker.  Physical therapy to strengthen and stretch your muscles.  Occupational therapy to help you with everyday tasks.  Alternative or complementary treatments such as exercise, massage, or acupuncture.   Follow these instructions at home:  Take over-the-counter and prescription medicines only as told by your health care provider.  Do not drive or use heavy machinery while taking prescription pain medicine.  Use assistive devices as recommended by your physical therapist or your health care provider.  Exercise as directed by your health care provider.  Eating healthy can help manage MS symptoms.  Return to your normal activities as told by your health care provider. Ask your health care provider what activities are safe for you.  Reach out for support. Share your feelings with friends, family, or a support group.  Keep all follow-up visits as told by your health care provider and therapists. This is important. Where to find more information  National Multiple Sclerosis Society: https://www.nationalmssociety.org  [employer] of Neurological Disorders and Stroke: https://johnson-smith.net/  [employer] for Complementary and Integrative Health: http://miller-hamilton.net/ Contact a health care provider if:  You feel depressed.  You develop new pain or numbness.  You have tremors.  You have problems with sexual function. Get help right away if:  You develop paralysis.  You develop numbness.  You have problems with your bladder or bowel  function.  You develop double vision.  You lose vision in one or both eyes.  You develop suicidal thoughts.  You develop severe confusion. If you ever feel like you may hurt yourself or others, or have thoughts about taking your own life, get help right away. You can go to your nearest emergency department or call:  Your local emergency services (911 in the [place]  A suicide crisis helpline, such as the National Suicide Prevention Lifeline at 646-089-0014. This is open 24 hours a day. Summary  Multiple sclerosis (MS) is a disease of the central nervous system that causes the body's immune system to destroy the protective covering (myelin sheath) around nerves in the brain.  There are 3 types of MS: relapsing-remitting, secondary progressive, and primary progressive. Relapsing-remitting and secondary progressive MS cause symptoms to occur in episodes or attacks that may last weeks to months. Primary progressive MS causes symptoms to steadily progress after they develop.  There is no cure for MS, but medicines can help decrease the number and frequency of attacks and help relieve nuisance symptoms. Treatment may also include physical or occupational therapy.  If you develop numbness, paralysis, vision problems, or other neurological symptoms, get help right away. This information is not intended to replace advice given to you by your health care provider. Make sure you discuss any questions you have with your health care provider. Document Revised: 01/29/2020 Document Reviewed: 01/29/2020 Elsevier Patient Education  2021 [employer].

## 2020-06-25 NOTE — Progress Notes (Deleted)
No chief complaint on file.    HISTORY OF PRESENT ILLNESS: 06/25/20 ALL:   Brittany Johnston is a 35 y.o. female here today for follow up for RRMS. She was previously on Tysabri and switched to Capaxone in 05/2020 as she learned she was pregnant.     HISTORY (copied from Dr Bonnita Hollow previous note)  Brittany Johnston is a 35 y.o. woman with relapsing remitting MS on Tysabri.   Update 8/17//2021: She is on Tysabri and tolerates it well.   She.  She is JCV antibody negative with a JCV antibody titer of 0.13.  She had labs yesterday and JCV is pending.  She tolerated Tysabri very well and had no exacerbations while on it.     She delivered last month (05/21/2019 Brittany Johnston) by C-section and did have a complication with some bleeding requiring a second procedure for cautery.. She and baby are doing well. She has no recent exacerbation.    She denies gait or balance issues.   Strength is good.   She denies current numbness.    Vision is fine.   She notes that she is not always getting the urge to urinate but will go a normal amount  She notes some fatigue. She is sleeping well.   She denies depression.  Cognition is doing well.    She had Covid-19 last month and she is recovered.   She has not yet had the vaccination.         MS History   Around 05/20/2016, she had the onset of left eye pain, worse with movement.   Symptoms started while driving and she noted looking to merge was painful. She saw her ophthalmologist and she had an MRI 06/03/2016 showing optic nerve enhancement on the left and a second focus in the right hemisphere that also enhanced.     She noted mild blurry vision shortly after that but it seemed worse 06/05/2016 and the next day, vision was worse.    She went to the [facility] ED and was admitted for 5 days of IV Solu-Medrol (3 days in hospital, 2 as outpatient).  There, she had an MRI of the cervical spine that showed 2 more lesions (at C4C5 and T1) consistent with MS.  In  retrospect, a few weeks before the visual symptoms, she noted right hand numbness, especially if she hold her phone which is persisting.     I have reviewed the MRI of the brain dated 06/03/2016 and the MRI of the cervical spine dated 06/08/2016.   The MRI of the brain showed enhancement of the left optic nerve. Additionally in the right temporal lobes there is another T2/FLAIR hyperintense focus that enhances. Additionally there is a periventricular right cerebellar focus that does not enhance. I also reviewed the MRI of the cervical spine. At C3-C4 there is a right lateral focus with subtle enhancement. There is also a right posterolateral focus adjacent to T1 that does not enhance.   Lab reports from her recent hospital stay were also evaluated. He is JCV antibody negative. Anti-NMO is negative.  She started Tysabri in March 2018.Marland Kitchen   She was started on Tysabri and did well.  She discontinued Tysabri summer 2020 after becoming pregnant.  She started Copaxone.  MRI of the brain after delivery March 2021 showed one new lesion that was not there previously.  She went back on Tysabri last month.  Imaging studies: MRI of the brain 07/09/2019 shows T2/flair hyperintense foci in the right  cerebellar hemisphere and in the cerebral hemispheres in a pattern configuration consistent with chronic demyelinating plaque associated with multiple sclerosis. None of the foci enhance or appear to be acute. One focus in the right temporal lobe was not apparent on the previous MRI from 08/16/2018.  I of the brain 08/16/2018 shows T2/flair hyperintense focus adjacent to the fourth ventricle in the right cerebellum and a couple smaller foci in the periventricular deep white matter of the hemispheres consistent with chronic demyelinating plaque associated with multiple sclerosis. When compared to the MRI dated 09/21/2017, there are no new lesions.  MRI of the cervical spine 09/21/2017 shows small foci within the spinal cord  posteriorly adjacent to C2-C3, to the right adjacent to C4 and posteriorly to the right adjacent to T1. All these foci were present on the 06/08/2016 MRI and were more apparent on the previous MRI than the current scan. These are consistent with small chronic demyelinating plaques associated with multiple sclerosis     REVIEW OF SYSTEMS: Out of a complete 14 system review of symptoms, the patient complains only of the following symptoms, and all other reviewed systems are negative.    ALLERGIES: No Known Allergies   HOME MEDICATIONS: Outpatient Medications Prior to Visit  Medication Sig Dispense Refill  . COPAXONE 40 MG/ML SOSY Inject 40 mg into the skin 3 (three) times a week. 36 mL 3   No facility-administered medications prior to visit.     PAST MEDICAL HISTORY: Past Medical History:  Diagnosis Date  . Multiple sclerosis (HCC)   . Vision abnormalities      PAST SURGICAL HISTORY: Past Surgical History:  Procedure Laterality Date  . CESAREAN SECTION N/A 05/21/2019   Procedure: CESAREAN SECTION;  Surgeon: Ranae Pila, MD;  Location: [facility] LD ORS;  Service: Obstetrics;  Laterality: N/A;  . CYSTOSCOPY N/A 05/22/2019   Procedure: CYSTOSCOPY;  Surgeon: Ranae Pila, MD;  Location: [facility] LD ORS;  Service: Gynecology;  Laterality: N/A;  . LAPAROTOMY N/A 05/22/2019   Procedure: LAPAROTOMY;  Surgeon: Ranae Pila, MD;  Location: [facility] LD ORS;  Service: Gynecology;  Laterality: N/A;  . LEEP     suppose to have follow up of cervical cells in January  . MYRINGOTOMY    . WISDOM TOOTH EXTRACTION       FAMILY HISTORY: Family History  Problem Relation Age of Onset  . Heart disease Mother   . Healthy Father   . Diabetes Maternal Grandmother   . Leukemia Paternal Grandfather      SOCIAL HISTORY: Social History   Socioeconomic History  . Marital status: Married    Spouse name: Not on file  . Number of children: Not on file  . Years of education: Not on  file  . Highest education level: Not on file  Occupational History  . Not on file  Tobacco Use  . Smoking status: Never Smoker  . Smokeless tobacco: Never Used  Substance and Sexual Activity  . Alcohol use: Yes    Comment: occasional  . Drug use: No  . Sexual activity: Yes    Birth control/protection: None  Other Topics Concern  . Not on file  Social History Narrative  . Not on file   Social Determinants of Health   Financial Resource Strain: Not on file  Food Insecurity: Not on file  Transportation Needs: Not on file  Physical Activity: Not on file  Stress: Not on file  Social Connections: Not on file  Intimate Partner Violence: Not  on file      PHYSICAL EXAM  There were no vitals filed for this visit. There is no height or weight on file to calculate BMI.   Generalized: Well developed, in no acute distress  Cardiology: normal rate and rhythm, no murmur auscultated  Respiratory: clear to auscultation bilaterally    Neurological examination  Mentation: Alert oriented to time, place, history taking. Follows all commands speech and language fluent Cranial nerve II-XII: Pupils were equal round reactive to light. Extraocular movements were full, visual field were full on confrontational test. Facial sensation and strength were normal. Uvula tongue midline. Head turning and shoulder shrug  were normal and symmetric. Motor: The motor testing reveals 5 over 5 strength of all 4 extremities. Good symmetric motor tone is noted throughout.  Sensory: Sensory testing is intact to soft touch on all 4 extremities. No evidence of extinction is noted.  Coordination: Cerebellar testing reveals good finger-nose-finger and heel-to-shin bilaterally.  Gait and station: Gait is normal. Tandem gait is normal. Romberg is negative. No drift is seen.  Reflexes: Deep tendon reflexes are symmetric and normal bilaterally.     DIAGNOSTIC DATA (LABS, IMAGING, TESTING) - I reviewed patient  records, labs, notes, testing and imaging myself where available.  Lab Results  Component Value Date   WBC 4.4 12/17/2019   HGB 11.9 12/17/2019   HCT 36.6 12/17/2019   MCV 83 12/17/2019   PLT 163 12/17/2019      Component Value Date/Time   NA 133 (L) 05/22/2019 0409   NA 137 02/12/2019 0850   K 4.1 05/22/2019 0409   CL 104 04/27/2019 1804   CO2 23 04/27/2019 1804   GLUCOSE 73 04/27/2019 1804   BUN 5 (L) 04/27/2019 1804   BUN 7 02/12/2019 0850   CREATININE 0.54 04/27/2019 1804   CALCIUM 9.0 04/27/2019 1804   PROT 5.7 (L) 04/27/2019 1804   PROT 5.9 (L) 02/12/2019 0850   ALBUMIN 2.7 (L) 04/27/2019 1804   ALBUMIN 3.5 (L) 02/12/2019 0850   AST 19 04/27/2019 1804   ALT 19 04/27/2019 1804   ALKPHOS 117 04/27/2019 1804   BILITOT 0.5 04/27/2019 1804   BILITOT <0.2 02/12/2019 0850   GFRNONAA >60 04/27/2019 1804   GFRAA >60 04/27/2019 1804   No results found for: CHOL, HDL, LDLCALC, LDLDIRECT, TRIG, CHOLHDL No results found for: PPJK9T No results found for: VITAMINB12 Lab Results  Component Value Date   TSH 1.480 06/28/2016    No flowsheet data found.   No flowsheet data found.   ASSESSMENT AND PLAN  35 y.o. year old female  has a past medical history of Multiple sclerosis (HCC) and Vision abnormalities. here with   No diagnosis found.    No orders of the defined types were placed in this encounter.    No orders of the defined types were placed in this encounter.     I spent 20 minutes of face-to-face and non-face-to-face time with patient.  This included previsit chart review, lab review, study review, order entry, electronic health record documentation, patient education.    Shawnie Dapper, MSN, FNP-C 06/25/2020, 4:45 PM  [facility] [address] [place], [place] [address] [phone]

## 2020-06-26 ENCOUNTER — Ambulatory Visit: Payer: No Typology Code available for payment source | Admitting: Family Medicine

## 2020-06-26 DIAGNOSIS — G35 Multiple sclerosis: Secondary | ICD-10-CM

## 2020-06-26 DIAGNOSIS — Z3490 Encounter for supervision of normal pregnancy, unspecified, unspecified trimester: Secondary | ICD-10-CM

## 2020-06-26 DIAGNOSIS — Z79899 Other long term (current) drug therapy: Secondary | ICD-10-CM

## 2020-06-26 NOTE — Progress Notes (Signed)
Chief Complaint  Patient presents with  . Follow-up    RM 1  Pt is well, doesn't think she has had any new symptoms.     HISTORY OF PRESENT ILLNESS: 06/30/20 ALL:   Brittany Johnston is a 35 y.o. female here today for follow up for RRMS. She was previously on Tysabri and switched to Capaxone in 05/2020 as she learned she was pregnant. She is [redacted] weeks gestation. She is tolerating injections but does have some redness and injection site tenderness. She is alternating sites. Labs were stable 12/2019. Recently drawn with OB. MRI brain did show 1 new focus in right temporal lobe not present on imaging in 08/2018.   She denies new or exacerbating symptoms. She has a 33 year old. She is working full time. She stays tired. She is sleeping fairly well. She feels mood is stable.   She does not plan to breastfeed. She would like to resume Tysabri infusions once she delivers. She was tolerating infusions well.    HISTORY (copied from Dr Bonnita Hollow previous note)  Brittany Johnston is a 35 y.o. woman with relapsing remitting MS on Tysabri.   Update 8/17//2021: She is on Tysabri and tolerates it well.   She.  She is JCV antibody negative with a JCV antibody titer of 0.13.  She had labs yesterday and JCV is pending.  She tolerated Tysabri very well and had no exacerbations while on it.     She delivered last month (05/21/2019 Brittany Johnston) by C-section and did have a complication with some bleeding requiring a second procedure for cautery.. She and baby are doing well. She has no recent exacerbation.    She denies gait or balance issues.   Strength is good.   She denies current numbness.    Vision is fine.   She notes that she is not always getting the urge to urinate but will go a normal amount  She notes some fatigue. She is sleeping well.   She denies depression.  Cognition is doing well.    She had Covid-19 last month and she is recovered.   She has not yet had the vaccination.         MS History    Around 05/20/2016, she had the onset of left eye pain, worse with movement.   Symptoms started while driving and she noted looking to merge was painful. She saw her ophthalmologist and she had an MRI 06/03/2016 showing optic nerve enhancement on the left and a second focus in the right hemisphere that also enhanced.     She noted mild blurry vision shortly after that but it seemed worse 06/05/2016 and the next day, vision was worse.    She went to the [facility] ED and was admitted for 5 days of IV Solu-Medrol (3 days in hospital, 2 as outpatient).  There, she had an MRI of the cervical spine that showed 2 more lesions (at C4C5 and T1) consistent with MS.  In retrospect, a few weeks before the visual symptoms, she noted right hand numbness, especially if she hold her phone which is persisting.     I have reviewed the MRI of the brain dated 06/03/2016 and the MRI of the cervical spine dated 06/08/2016.   The MRI of the brain showed enhancement of the left optic nerve. Additionally in the right temporal lobes there is another T2/FLAIR hyperintense focus that enhances. Additionally there is a periventricular right cerebellar focus that does not enhance. I also  reviewed the MRI of the cervical spine. At C3-C4 there is a right lateral focus with subtle enhancement. There is also a right posterolateral focus adjacent to T1 that does not enhance.   Lab reports from her recent hospital stay were also evaluated. He is JCV antibody negative. Anti-NMO is negative.  She started Tysabri in March 2018.Marland Kitchen   She was started on Tysabri and did well.  She discontinued Tysabri summer 2020 after becoming pregnant.  She started Copaxone.  MRI of the brain after delivery March 2021 showed one new lesion that was not there previously.  She went back on Tysabri last month.  Imaging studies: MRI of the brain 07/09/2019 shows T2/flair hyperintense foci in the right cerebellar hemisphere and in the cerebral hemispheres in a pattern configuration  consistent with chronic demyelinating plaque associated with multiple sclerosis. None of the foci enhance or appear to be acute. One focus in the right temporal lobe was not apparent on the previous MRI from 08/16/2018.  I of the brain 08/16/2018 shows T2/flair hyperintense focus adjacent to the fourth ventricle in the right cerebellum and a couple smaller foci in the periventricular deep white matter of the hemispheres consistent with chronic demyelinating plaque associated with multiple sclerosis. When compared to the MRI dated 09/21/2017, there are no new lesions.  MRI of the cervical spine 09/21/2017 shows small foci within the spinal cord posteriorly adjacent to C2-C3, to the right adjacent to C4 and posteriorly to the right adjacent to T1. All these foci were present on the 06/08/2016 MRI and were more apparent on the previous MRI than the current scan. These are consistent with small chronic demyelinating plaques associated with multiple sclerosis     REVIEW OF SYSTEMS: Out of a complete 14 system review of symptoms, the patient complains only of the following symptoms, fatigue and all other reviewed systems are negative.    ALLERGIES: No Known Allergies   HOME MEDICATIONS: Outpatient Medications Prior to Visit  Medication Sig Dispense Refill  . COPAXONE 40 MG/ML SOSY Inject 40 mg into the skin 3 (three) times a week. 36 mL 3  . Docosahexaenoic Acid (PRENATAL DHA) 200 MG CAPS Prenatal + DHA     No facility-administered medications prior to visit.     PAST MEDICAL HISTORY: Past Medical History:  Diagnosis Date  . Multiple sclerosis (HCC)   . Vision abnormalities      PAST SURGICAL HISTORY: Past Surgical History:  Procedure Laterality Date  . CESAREAN SECTION N/A 05/21/2019   Procedure: CESAREAN SECTION;  Surgeon: Ranae Pila, MD;  Location: [facility] LD ORS;  Service: Obstetrics;  Laterality: N/A;  . CYSTOSCOPY N/A 05/22/2019   Procedure: CYSTOSCOPY;  Surgeon: Ranae Pila, MD;  Location: [facility] LD ORS;  Service: Gynecology;  Laterality: N/A;  . LAPAROTOMY N/A 05/22/2019   Procedure: LAPAROTOMY;  Surgeon: Ranae Pila, MD;  Location: [facility] LD ORS;  Service: Gynecology;  Laterality: N/A;  . LEEP     suppose to have follow up of cervical cells in January  . MYRINGOTOMY    . WISDOM TOOTH EXTRACTION       FAMILY HISTORY: Family History  Problem Relation Age of Onset  . Heart disease Mother   . Healthy Father   . Diabetes Maternal Grandmother   . Leukemia Paternal Grandfather      SOCIAL HISTORY: Social History   Socioeconomic History  . Marital status: Married    Spouse name: Not on file  . Number of children: Not  on file  . Years of education: Not on file  . Highest education level: Not on file  Occupational History  . Not on file  Tobacco Use  . Smoking status: Never Smoker  . Smokeless tobacco: Never Used  Substance and Sexual Activity  . Alcohol use: Yes    Comment: occasional  . Drug use: No  . Sexual activity: Yes    Birth control/protection: None  Other Topics Concern  . Not on file  Social History Narrative  . Not on file   Social Determinants of Health   Financial Resource Strain: Not on file  Food Insecurity: Not on file  Transportation Needs: Not on file  Physical Activity: Not on file  Stress: Not on file  Social Connections: Not on file  Intimate Partner Violence: Not on file      PHYSICAL EXAM  Vitals:   06/30/20 0825  BP: 111/71  Pulse: 84  Weight: 171 lb (77.6 kg)  Height: 5\' 1"  (1.549 m)   Body mass index is 32.31 kg/m.   Generalized: Well developed, in no acute distress  Cardiology: normal rate and rhythm, no murmur auscultated  Respiratory: clear to auscultation bilaterally    Neurological examination  Mentation: Alert oriented to time, place, history taking. Follows all commands speech and language fluent Cranial nerve II-XII: Pupils were equal round reactive to light.  Extraocular movements were full, visual field were full on confrontational test. Facial sensation and strength were normal. Head turning and shoulder shrug  were normal and symmetric. Motor: The motor testing reveals 5 over 5 strength of all 4 extremities. Good symmetric motor tone is noted throughout.  Sensory: Sensory testing is intact to soft touch on all 4 extremities. No evidence of extinction is noted.  Coordination: Cerebellar testing reveals good finger-nose-finger and heel-to-shin bilaterally.  Gait and station: Gait is normal.  Reflexes: Deep tendon reflexes are symmetric and normal bilaterally.     DIAGNOSTIC DATA (LABS, IMAGING, TESTING) - I reviewed patient records, labs, notes, testing and imaging myself where available.  Lab Results  Component Value Date   WBC 4.4 12/17/2019   HGB 11.9 12/17/2019   HCT 36.6 12/17/2019   MCV 83 12/17/2019   PLT 163 12/17/2019      Component Value Date/Time   NA 133 (L) 05/22/2019 0409   NA 137 02/12/2019 0850   K 4.1 05/22/2019 0409   CL 104 04/27/2019 1804   CO2 23 04/27/2019 1804   GLUCOSE 73 04/27/2019 1804   BUN 5 (L) 04/27/2019 1804   BUN 7 02/12/2019 0850   CREATININE 0.54 04/27/2019 1804   CALCIUM 9.0 04/27/2019 1804   PROT 5.7 (L) 04/27/2019 1804   PROT 5.9 (L) 02/12/2019 0850   ALBUMIN 2.7 (L) 04/27/2019 1804   ALBUMIN 3.5 (L) 02/12/2019 0850   AST 19 04/27/2019 1804   ALT 19 04/27/2019 1804   ALKPHOS 117 04/27/2019 1804   BILITOT 0.5 04/27/2019 1804   BILITOT <0.2 02/12/2019 0850   GFRNONAA >60 04/27/2019 1804   GFRAA >60 04/27/2019 1804   No results found for: CHOL, HDL, LDLCALC, LDLDIRECT, TRIG, CHOLHDL No results found for: 04/29/2019 No results found for: VITAMINB12 Lab Results  Component Value Date   TSH 1.480 06/28/2016    No flowsheet data found.   No flowsheet data found.   ASSESSMENT AND PLAN  35 y.o. year old female  has a past medical history of Multiple sclerosis (HCC) and Vision  abnormalities. here with   Relapsing remitting multiple  sclerosis (HCC)  High risk medication use  Pregnancy, unspecified gestational age  Other fatigue  Mishawn s doing well, today. She is [redacted] weeks gestation. She will continue Capaxone injections. May consider pretreating with Tylenol 30 minutes prior for tenderness. We have discussed results of last MRI. In consult with Dr Epimenio Foot, we will defer repeat imaging at this time. She is aware to call with any new or worsening symptoms. Will plan to resume Tysabri following delivery. She will continue close follow up with OB. She will have lab results sent to me for review. Healthy lifestyle habits reviewed. She will follow up with Dr Epimenio Foot in 6 months.   No orders of the defined types were placed in this encounter.    No orders of the defined types were placed in this encounter.     I spent 20 minutes of face-to-face and non-face-to-face time with patient.  This included previsit chart review, lab review, study review, order entry, electronic health record documentation, patient education.    Shawnie Dapper, MSN, FNP-C 06/30/2020, 9:23 AM  [facility] [address] [place], [place] [address] [phone]

## 2020-06-30 ENCOUNTER — Encounter: Payer: Self-pay | Admitting: Family Medicine

## 2020-06-30 ENCOUNTER — Other Ambulatory Visit: Payer: Self-pay

## 2020-06-30 ENCOUNTER — Ambulatory Visit: Payer: No Typology Code available for payment source | Admitting: Family Medicine

## 2020-06-30 VITALS — BP 111/71 | HR 84 | Ht 61.0 in | Wt 171.0 lb

## 2020-06-30 DIAGNOSIS — R5383 Other fatigue: Secondary | ICD-10-CM

## 2020-06-30 DIAGNOSIS — Z3490 Encounter for supervision of normal pregnancy, unspecified, unspecified trimester: Secondary | ICD-10-CM

## 2020-06-30 DIAGNOSIS — G35 Multiple sclerosis: Secondary | ICD-10-CM

## 2020-06-30 DIAGNOSIS — Z79899 Other long term (current) drug therapy: Secondary | ICD-10-CM | POA: Diagnosis not present

## 2020-06-30 NOTE — Progress Notes (Signed)
I have read the note, and I agree with the clinical assessment and plan.  Richard A. Sater, MD, PhD, FAAN Certified in Neurology, Clinical Neurophysiology, Sleep Medicine, Pain Medicine and Neuroimaging  [facility] [address], [address] [place], [place] [address] [phone]  

## 2020-06-30 NOTE — Patient Instructions (Signed)
Below is our plan:  We will continue Capaxone for now. We will discuss repeat MRI with Dr Epimenio Foot. Continue close follow up with OB. Consider pretreatment with Ty;lenol for injection site tenderness.   Please make sure you are staying well hydrated. I recommend 50-60 ounces daily. Well balanced diet and regular exercise encouraged. Consistent sleep schedule with 6-8 hours recommended.   Please continue follow up with care team as directed.   Follow up with Dr Epimenio Foot in 6 months   You may receive a survey regarding today's visit. I encourage you to leave honest feed back as I do use this information to improve patient care. Thank you for seeing me today!    Multiple Sclerosis Multiple sclerosis (MS) is a disease of the brain, spinal cord, and optic nerves (central nervous system). It causes the body's disease-fighting (immune) system to destroy the protective covering (myelin sheath) around nerves in the brain. When this happens, signals (nerve impulses) going to and from the brain and spinal cord do not get sent properly or may not get sent at all. There are several types of MS:  Relapsing-remitting MS. This is the most common type. This causes sudden attacks of symptoms. After an attack, you may recover completely until the next attack, or some symptoms may remain permanently.  Secondary progressive MS. This usually develops after the onset of relapsing-remitting MS. Similar to relapsing-remitting MS, this type also causes sudden attacks of symptoms. Attacks may be less frequent, but symptoms slowly get worse (progress) over time.  Primary progressive MS. This causes symptoms that steadily progress over time. This type of MS does not cause sudden attacks of symptoms. The age of onset of MS varies, but it often develops between 17-52 years of age. MS is a lifelong (chronic) condition. There is no cure, but treatment can help slow down the progression of the disease. What are the causes? The  cause of this condition is not known. What increases the risk? You are more likely to develop this condition if:  You are a woman.  You have a relative with MS. However, the condition is not passed from parent to child (inherited).  You have a lack (deficiency) of vitamin D.  You smoke. MS is more common in the [place] than in the [place]. What are the signs or symptoms? Relapsing-remitting and secondary progressive MS cause symptoms to occur in episodes or attacks that may last weeks to months. There may be long periods between attacks in which there are almost no symptoms. Primary progressive MS causes symptoms to steadily progress after they develop. Symptoms of MS vary because of the many different ways it affects the central nervous system. The main symptoms include:  Vision problems and eye pain.  Numbness and weakness.  Inability to move your arms, hands, feet, or legs (paralysis).  Balance problems.  Shaking that you cannot control (tremors).  Muscle spasms.  Problems with thinking (cognitive changes). MS can also cause symptoms that are associated with the disease, but are not always the direct result of an MS attack. They may include:  Inability to control urination or bowel movements (incontinence).  Headaches.  Fatigue.  Inability to tolerate heat.  Emotional changes.  Depression.  Pain. How is this diagnosed? This condition is diagnosed based on:  Your symptoms.  A neurological exam. This involves checking central nervous system function, such as nerve function, reflexes, and coordination.  MRIs of the brain and spinal cord.  Lab tests, including  a lumbar puncture that tests the fluid that surrounds the brain and spinal cord (cerebrospinal fluid).  Tests to measure the electrical activity of the brain in response to stimulation (evoked potentials). How is this treated? There is no cure for MS, but medicines can help  decrease the number and frequency of attacks and help relieve nuisance symptoms. Treatment options may include:  Medicines that reduce the frequency of attacks. These medicines may be given by injection, by mouth (orally), or through an IV.  Medicines that reduce inflammation (steroids). These may provide short-term relief of symptoms.  Medicines to help control pain, depression, fatigue, or incontinence.  Nutritional counseling. Vitamin D supplements, if you have a deficiency.  Using devices to help you move around (assistive devices), such as braces, a cane, or a walker.  Physical therapy to strengthen and stretch your muscles.  Occupational therapy to help you with everyday tasks.  Alternative or complementary treatments such as exercise, massage, or acupuncture.   Follow these instructions at home:  Take over-the-counter and prescription medicines only as told by your health care provider.  Do not drive or use heavy machinery while taking prescription pain medicine.  Use assistive devices as recommended by your physical therapist or your health care provider.  Exercise as directed by your health care provider.  Eating healthy can help manage MS symptoms.  Return to your normal activities as told by your health care provider. Ask your health care provider what activities are safe for you.  Reach out for support. Share your feelings with friends, family, or a support group.  Keep all follow-up visits as told by your health care provider and therapists. This is important. Where to find more information  National Multiple Sclerosis Society: https://www.nationalmssociety.org  [employer] of Neurological Disorders and Stroke: https://johnson-smith.net/  [employer] for Complementary and Integrative Health: http://miller-hamilton.net/ Contact a health care provider if:  You feel depressed.  You develop new pain or numbness.  You have tremors.  You have problems  with sexual function. Get help right away if:  You develop paralysis.  You develop numbness.  You have problems with your bladder or bowel function.  You develop double vision.  You lose vision in one or both eyes.  You develop suicidal thoughts.  You develop severe confusion. If you ever feel like you may hurt yourself or others, or have thoughts about taking your own life, get help right away. You can go to your nearest emergency department or call:  Your local emergency services (911 in the [place]  A suicide crisis helpline, such as the National Suicide Prevention Lifeline at 215-360-9550. This is open 24 hours a day. Summary  Multiple sclerosis (MS) is a disease of the central nervous system that causes the body's immune system to destroy the protective covering (myelin sheath) around nerves in the brain.  There are 3 types of MS: relapsing-remitting, secondary progressive, and primary progressive. Relapsing-remitting and secondary progressive MS cause symptoms to occur in episodes or attacks that may last weeks to months. Primary progressive MS causes symptoms to steadily progress after they develop.  There is no cure for MS, but medicines can help decrease the number and frequency of attacks and help relieve nuisance symptoms. Treatment may also include physical or occupational therapy.  If you develop numbness, paralysis, vision problems, or other neurological symptoms, get help right away. This information is not intended to replace advice given to you by your health care provider. Make sure you discuss any questions you  have with your health care provider. Document Revised: 01/29/2020 Document Reviewed: 01/29/2020 Elsevier Patient Education  2021 [employer].

## 2020-07-01 ENCOUNTER — Telehealth: Payer: No Typology Code available for payment source | Admitting: Family Medicine

## 2020-08-07 ENCOUNTER — Telehealth: Payer: Self-pay | Admitting: Family Medicine

## 2020-08-07 NOTE — Telephone Encounter (Signed)
Pharmacist Michelle([facility]) is asking for a call to know if pt can take generic for  COPAXONE 40 MG/ML SOSY-.  Brittany Johnston said her vm is secured if a message has to be left for her, please call her @ [phone]

## 2020-08-07 NOTE — Telephone Encounter (Signed)
Called and LMVM for [facility] with [facility], that pt is pregnant and is taking BN Copaxone, and per notes has been approved for this.  She is to call back if questions.

## 2020-08-18 ENCOUNTER — Telehealth: Payer: Self-pay | Admitting: Family Medicine

## 2020-08-18 NOTE — Telephone Encounter (Signed)
Faxed to [facility] receipt of escript for copaxone #36 with 3 refills [phone], fax [phone].  Confirmation received by fax.

## 2020-08-18 NOTE — Telephone Encounter (Signed)
[facility] Gaynelle Arabian) request refill COPAXONE 40 MG/ML SOSY at [facility].

## 2020-11-25 ENCOUNTER — Encounter (HOSPITAL_COMMUNITY): Payer: Self-pay | Admitting: Obstetrics and Gynecology

## 2020-11-25 ENCOUNTER — Inpatient Hospital Stay (HOSPITAL_COMMUNITY)
Admission: AD | Admit: 2020-11-25 | Discharge: 2020-11-26 | Disposition: A | Discharge status: Home / Self Care | Payer: Worker's Compensation | Attending: Obstetrics and Gynecology | Admitting: Obstetrics and Gynecology

## 2020-11-25 ENCOUNTER — Other Ambulatory Visit: Payer: Self-pay

## 2020-11-25 DIAGNOSIS — R109 Unspecified abdominal pain: Secondary | ICD-10-CM | POA: Insufficient documentation

## 2020-11-25 DIAGNOSIS — Y929 Unspecified place or not applicable: Secondary | ICD-10-CM | POA: Insufficient documentation

## 2020-11-25 DIAGNOSIS — O9A213 Injury, poisoning and certain other consequences of external causes complicating pregnancy, third trimester: Secondary | ICD-10-CM | POA: Insufficient documentation

## 2020-11-25 DIAGNOSIS — O26893 Other specified pregnancy related conditions, third trimester: Secondary | ICD-10-CM | POA: Insufficient documentation

## 2020-11-25 DIAGNOSIS — Y939 Activity, unspecified: Secondary | ICD-10-CM | POA: Insufficient documentation

## 2020-11-25 DIAGNOSIS — Z3A32 32 weeks gestation of pregnancy: Secondary | ICD-10-CM | POA: Insufficient documentation

## 2020-11-25 DIAGNOSIS — T1490XA Injury, unspecified, initial encounter: Secondary | ICD-10-CM | POA: Insufficient documentation

## 2020-11-25 DIAGNOSIS — O26899 Other specified pregnancy related conditions, unspecified trimester: Secondary | ICD-10-CM

## 2020-11-25 MED ORDER — ACETAMINOPHEN 500 MG PO TABS
1000.0000 mg | ORAL_TABLET | Freq: Once | ORAL | Status: AC
  Administered 2020-11-26: 1000 mg via ORAL
  Filled 2020-11-25: qty 2

## 2020-11-25 MED ORDER — CYCLOBENZAPRINE HCL 5 MG PO TABS
10.0000 mg | ORAL_TABLET | Freq: Once | ORAL | Status: AC
  Administered 2020-11-26: 10 mg via ORAL
  Filled 2020-11-25: qty 2

## 2020-11-25 NOTE — MAU Provider Note (Signed)
History     CSN: [id number]  Arrival date and time: 11/25/20 2236   Event Date/Time   First Provider Initiated Contact with Patient 11/25/20 2349      Chief Complaint  Patient presents with   Abdominal Pain   HPI Brittany Johnston is a 35 y.o. G2P1001 at [redacted]w[redacted]d who presents with abdominal pain.  Pain started around 1 PM.  Describes as throbbing pain in the middle right side of her abdomen.  Rates pain 10/10. Also feels some tightening pain in the top right side of her abdomen where it feels like there is a fetal part presenting.  Has not treated symptoms.  States earlier today when she was at her OB appointment the elevator door hit the side of her body.  She is unsure if that is related to her pain.  Denies fever, nausea, vomiting, dysuria, vaginal bleeding, leaking of fluid.  Noted decreased fetal movement earlier today but reports normal fetal movement this evening. OB History     Gravida  2   Para  1   Term  1   Preterm      AB      Living  1      SAB      IAB      Ectopic      Multiple  0   Live Births  1           Past Medical History:  Diagnosis Date   Multiple sclerosis (HCC)    Vision abnormalities     Past Surgical History:  Procedure Laterality Date   CESAREAN SECTION N/A 05/21/2019   Procedure: CESAREAN SECTION;  Surgeon: Ranae Pila, MD;  Location: [facility] LD ORS;  Service: Obstetrics;  Laterality: N/A;   CYSTOSCOPY N/A 05/22/2019   Procedure: CYSTOSCOPY;  Surgeon: Ranae Pila, MD;  Location: [facility] LD ORS;  Service: Gynecology;  Laterality: N/A;   LAPAROTOMY N/A 05/22/2019   Procedure: LAPAROTOMY;  Surgeon: Ranae Pila, MD;  Location: [facility] LD ORS;  Service: Gynecology;  Laterality: N/A;   LEEP     suppose to have follow up of cervical cells in January   MYRINGOTOMY     WISDOM TOOTH EXTRACTION      Family History  Problem Relation Age of Onset   Heart disease Mother    Healthy Father    Diabetes Maternal Grandmother     Leukemia Paternal Grandfather     Social History   Tobacco Use   Smoking status: Never   Smokeless tobacco: Never  Substance Use Topics   Alcohol use: Not Currently    Comment: occasional   Drug use: No    Allergies: No Known Allergies  Medications Prior to Admission  Medication Sig Dispense Refill Last Dose   COPAXONE 40 MG/ML SOSY Inject 40 mg into the skin 3 (three) times a week. 36 mL 3 Past Week   Docosahexaenoic Acid (PRENATAL DHA) 200 MG CAPS Prenatal + DHA       Review of Systems  Constitutional: Negative.   Gastrointestinal:  Positive for abdominal pain.  Genitourinary: Negative.   Physical Exam   Blood pressure 114/73, pulse 99, temperature 98.1 F (36.7 C), temperature source Oral, resp. rate 16, height 5\' 1"  (1.549 m), SpO2 99 %, unknown if currently breastfeeding.  Physical Exam Vitals and nursing note reviewed. Exam conducted with a chaperone present.  Constitutional:      General: She is not in acute distress.    Appearance: She is  well-developed.  HENT:     Head: Normocephalic and atraumatic.  Eyes:     General: No scleral icterus. Abdominal:     Comments: Gravid uterus. Some mild contractions palpated, otherwise good resting tone. No bruising. Point tenderness of ~ 6 cm area in right mid abdomen.   Genitourinary:    Comments: Dilation: Closed Effacement (%): Thick Cervical Position: Posterior Station: Ballotable Exam by:: Judeth Horn NP  Skin:    General: Skin is warm and dry.  Neurological:     Mental Status: She is alert.  Psychiatric:        Mood and Affect: Mood is anxious.        Behavior: Behavior normal.   NST:  Baseline: 140 bpm, Variability: Good {> 6 bpm), Accelerations: Reactive, and Decelerations: Absent  MAU Course  Procedures Results for orders placed or performed during the hospital encounter of 11/25/20 (from the past 24 hour(s))  CBC with Differential/Platelet     Status: Abnormal   Collection Time: 11/26/20 12:26  AM  Result Value Ref Range   WBC 11.8 (H) 4.0 - 10.5 K/uL   RBC 3.64 (L) 3.87 - 5.11 MIL/uL   Hemoglobin 10.7 (L) 12.0 - 15.0 g/dL   HCT 95.2 (L) 84.1 - 32.4 %   MCV 89.8 80.0 - 100.0 fL   MCH 29.4 26.0 - 34.0 pg   MCHC 32.7 30.0 - 36.0 g/dL   RDW 40.1 02.7 - 25.3 %   Platelets 224 150 - 400 K/uL   nRBC 0.0 0.0 - 0.2 %   Neutrophils Relative % 79 %   Neutro Abs 9.3 (H) 1.7 - 7.7 K/uL   Lymphocytes Relative 14 %   Lymphs Abs 1.6 0.7 - 4.0 K/uL   Monocytes Relative 5 %   Monocytes Absolute 0.6 0.1 - 1.0 K/uL   Eosinophils Relative 1 %   Eosinophils Absolute 0.1 0.0 - 0.5 K/uL   Basophils Relative 0 %   Basophils Absolute 0.0 0.0 - 0.1 K/uL   Immature Granulocytes 1 %   Abs Immature Granulocytes 0.09 (H) 0.00 - 0.07 K/uL    MDM Patient presents with abdominal pain that started earlier today.  Initially described as intermittent pain in the right upper quadrant with associated abdominal tightening, also reports a constant pain in the mid right area that is tender to palpation.  She is concerned that it may be related to being hit in the abdomen by a closing elevator door earlier today.  Patient also questions whether or not she could possibly have appendicitis.  She has no other symptoms.  She has a reactive fetal tracing and some irregular contractions on the monitor.  Other than some minor tenderness to palpation in the right mid abdomen she has a benign abdominal exam.  Her cervix is closed and thick.  She is afebrile with a white count that is normal for pregnancy and no concerning symptoms for appendicitis.  She has a negative urinalysis.  Limited OB ultrasound was ordered for patient reassurance which was normal.  Her pain was improved with medication given in MAU.  Reviewed patient with Dr. Debroah Loop, she is stable for discharge.  Patient is reassured by evaluation and will follow up with her OB as scheduled.  Assessment and Plan   1. Abdominal pain affecting pregnancy   2. [redacted] weeks  gestation of pregnancy   3. Traumatic injury during pregnancy, third trimester    -reviewed reasons to return to MAU -take tylenol prn -keep f/u with OB  Judeth Horn 11/25/2020, 11:49 PM

## 2020-11-25 NOTE — MAU Note (Signed)
Pt presents to MAU c/o mid abdominal and side pain since 1 pm that feels throbbing, and comes intermittently after being bumped with an elevator door earlier today.  Pt rates pain as 7/10.  Pt also reports decreased fetal movement over the last few hours. Pt denies complications with the pregnancy. Pt denies vaginal bleeding or LOF.  Vitals: BP: 114/73 HR: 101 SpO2: 99 Temp: 98.1

## 2020-11-26 ENCOUNTER — Inpatient Hospital Stay (HOSPITAL_BASED_OUTPATIENT_CLINIC_OR_DEPARTMENT_OTHER): Payer: Worker's Compensation

## 2020-11-26 DIAGNOSIS — T1490XA Injury, unspecified, initial encounter: Secondary | ICD-10-CM | POA: Diagnosis not present

## 2020-11-26 DIAGNOSIS — R102 Pelvic and perineal pain: Secondary | ICD-10-CM

## 2020-11-26 DIAGNOSIS — W228XXA Striking against or struck by other objects, initial encounter: Secondary | ICD-10-CM | POA: Diagnosis not present

## 2020-11-26 DIAGNOSIS — Z3A31 31 weeks gestation of pregnancy: Secondary | ICD-10-CM

## 2020-11-26 DIAGNOSIS — Z3A32 32 weeks gestation of pregnancy: Secondary | ICD-10-CM | POA: Diagnosis not present

## 2020-11-26 DIAGNOSIS — Y939 Activity, unspecified: Secondary | ICD-10-CM | POA: Diagnosis not present

## 2020-11-26 DIAGNOSIS — O9A213 Injury, poisoning and certain other consequences of external causes complicating pregnancy, third trimester: Secondary | ICD-10-CM

## 2020-11-26 DIAGNOSIS — Y929 Unspecified place or not applicable: Secondary | ICD-10-CM | POA: Diagnosis not present

## 2020-11-26 DIAGNOSIS — O26893 Other specified pregnancy related conditions, third trimester: Secondary | ICD-10-CM

## 2020-11-26 DIAGNOSIS — R109 Unspecified abdominal pain: Secondary | ICD-10-CM | POA: Diagnosis not present

## 2020-11-26 LAB — CBC WITH DIFFERENTIAL/PLATELET
Abs Immature Granulocytes: 0.09 10*3/uL — ABNORMAL HIGH (ref 0.00–0.07)
Basophils Absolute: 0 10*3/uL (ref 0.0–0.1)
Basophils Relative: 0 %
Eosinophils Absolute: 0.1 10*3/uL (ref 0.0–0.5)
Eosinophils Relative: 1 %
HCT: 32.7 % — ABNORMAL LOW (ref 36.0–46.0)
Hemoglobin: 10.7 g/dL — ABNORMAL LOW (ref 12.0–15.0)
Immature Granulocytes: 1 %
Lymphocytes Relative: 14 %
Lymphs Abs: 1.6 10*3/uL (ref 0.7–4.0)
MCH: 29.4 pg (ref 26.0–34.0)
MCHC: 32.7 g/dL (ref 30.0–36.0)
MCV: 89.8 fL (ref 80.0–100.0)
Monocytes Absolute: 0.6 10*3/uL (ref 0.1–1.0)
Monocytes Relative: 5 %
Neutro Abs: 9.3 10*3/uL — ABNORMAL HIGH (ref 1.7–7.7)
Neutrophils Relative %: 79 %
Platelets: 224 10*3/uL (ref 150–400)
RBC: 3.64 MIL/uL — ABNORMAL LOW (ref 3.87–5.11)
RDW: 13.7 % (ref 11.5–15.5)
WBC: 11.8 10*3/uL — ABNORMAL HIGH (ref 4.0–10.5)
nRBC: 0 % (ref 0.0–0.2)

## 2020-11-26 LAB — URINALYSIS, ROUTINE W REFLEX MICROSCOPIC
Bilirubin Urine: NEGATIVE
Glucose, UA: NEGATIVE mg/dL
Hgb urine dipstick: NEGATIVE
Ketones, ur: NEGATIVE mg/dL
Leukocytes,Ua: NEGATIVE
Nitrite: NEGATIVE
Protein, ur: NEGATIVE mg/dL
Specific Gravity, Urine: 1.01 (ref 1.005–1.030)
pH: 7 (ref 5.0–8.0)

## 2020-11-26 MED ORDER — OXYCODONE HCL 5 MG PO TABS
5.0000 mg | ORAL_TABLET | Freq: Once | ORAL | Status: AC
  Administered 2020-11-26: 5 mg via ORAL
  Filled 2020-11-26: qty 1

## 2020-12-25 NOTE — H&P (Signed)
Brittany Johnston is a 35 y.o. female presenting for scheduled repeat cesarean section. She has a history of a previous cesarean section c/b pp XL of an 8#2 infant. She has a history of MS and has been on Copaxone. She has GAD controlled w/out meds. She has known kidney stone. This pregnancy c/b fetal pelviectasis that has resolved as well as LGA. Last growth [place] at 78 wga in 97%ile (8#8). They are expecting a boy - "Brittany Johnston" who they will call Brittany Johnston. She is not immune to the rubella virus so the MMR is planned pp.   OB History     Gravida  2   Para  1   Term  1   Preterm      AB      Living  1      SAB      IAB      Ectopic      Multiple  0   Live Births  1          Past Medical History:  Diagnosis Date   Multiple sclerosis ([place])    Vision abnormalities    Past Surgical History:  Procedure Laterality Date   CESAREAN SECTION N/A 05/21/2019   Procedure: CESAREAN SECTION;  Surgeon: Tyson Dense, MD;  Location: [facility] LD ORS;  Service: Obstetrics;  Laterality: N/A;   CYSTOSCOPY N/A 05/22/2019   Procedure: CYSTOSCOPY;  Surgeon: Tyson Dense, MD;  Location: [facility] LD ORS;  Service: Gynecology;  Laterality: N/A;   LAPAROTOMY N/A 05/22/2019   Procedure: LAPAROTOMY;  Surgeon: Tyson Dense, MD;  Location: [facility] LD ORS;  Service: Gynecology;  Laterality: N/A;   LEEP     suppose to have follow up of cervical cells in January   MYRINGOTOMY     WISDOM TOOTH EXTRACTION     Family History: family history includes Diabetes in her maternal grandmother; Healthy in her father; Heart disease in her mother; Leukemia in her paternal grandfather. Social History:  reports that she has never smoked. She has never used smokeless tobacco. She reports that she does not currently use alcohol. She reports that she does not use drugs.     Maternal Diabetes: No Genetic Screening: Normal Maternal Ultrasounds/Referrals: Normal - pelviectasis resolved Fetal Ultrasounds or  other Referrals:  None Maternal Substance Abuse:  No Significant Maternal Medications:  None Significant Maternal Lab Results:  None Other Comments:  None  Review of Systems  Gastrointestinal:  Negative for anal bleeding.  History   unknown if currently breastfeeding. Exam Physical Exam  (from office) NAD, A&O NWOB Abd soft, nondistended, gravid  Prenatal labs: ABO, Rh:  O+ Antibody:  neg Rubella:  NON IMMUNE RPR:   NR HBsAg:   Neg HIV:   NR GBS:   pending  Assessment/Plan:  35yo G2P1001 presenting for scheduled repeat CS. Risks discussed including infection, bleeding, damage to surrounding structures, the need for additional procedures including hysterectomy, and the possibility of uterine rupture with neonatal morbidity/mortality, scarring, and abnormal placentation with subsequent pregnancies. Patient agrees to proceed. 2g ancef on call to OR.  MMR pp.  Can continue Copaxone during breastfeeding if desires.     Tyson Dense 12/25/2020, 1:47 PM

## 2020-12-29 ENCOUNTER — Ambulatory Visit: Payer: No Typology Code available for payment source | Admitting: Neurology

## 2020-12-29 ENCOUNTER — Telehealth: Payer: Self-pay | Admitting: Neurology

## 2020-12-29 ENCOUNTER — Encounter: Payer: Self-pay | Admitting: Neurology

## 2020-12-29 VITALS — BP 138/80 | HR 92 | Ht 64.5 in | Wt 198.0 lb

## 2020-12-29 DIAGNOSIS — Z79899 Other long term (current) drug therapy: Secondary | ICD-10-CM

## 2020-12-29 DIAGNOSIS — G35 Multiple sclerosis: Secondary | ICD-10-CM

## 2020-12-29 DIAGNOSIS — R200 Anesthesia of skin: Secondary | ICD-10-CM | POA: Diagnosis not present

## 2020-12-29 DIAGNOSIS — Z3490 Encounter for supervision of normal pregnancy, unspecified, unspecified trimester: Secondary | ICD-10-CM

## 2020-12-29 DIAGNOSIS — G5603 Carpal tunnel syndrome, bilateral upper limbs: Secondary | ICD-10-CM | POA: Diagnosis not present

## 2020-12-29 DIAGNOSIS — H469 Unspecified optic neuritis: Secondary | ICD-10-CM | POA: Diagnosis not present

## 2020-12-29 NOTE — Progress Notes (Signed)
[facility]  PATIENT: Brittany Johnston DOB: 11-13-1985  REFERRING DOCTOR OR PCP:  Aura Camps [facility])    Fax [phone]  _________________________________   HISTORICAL  CHIEF COMPLAINT:  Chief Complaint  Patient presents with   Follow-up    RM 1, alone. Baby boy due 01/09/21. Has tingling in her R wrist. Unsure if its pregnancy or MS related. Had blurry vision for one day last week but resolved now.    HISTORY OF PRESENT ILLNESS:  Brittany Johnston is a 35 y.o. woman with relapsing remitting MS on Tysabri.   Update 8/17//2021: She is 8 1/2 months pregnant and is due in 11 days for a C-section 01/09/2021.  She had a 30 minute period of time with whitening and blurring of the vision on the rigt.  Once it improved there were no further visual symptoms.  She is experiencing some neurologic symptoms.      She has numbnes and pain in her wrists, right > left, exacerbated by activities  She is currently on Copaxone but would like to get back to Tysabri after delivery.  She is JCV antibody negative She tolerated Tysabri very well and had no exacerbations while on it.     She previously had a child (05/21/2019 Brittany Johnston) by C-section and did have a complication with some bleeding requiring a second procedure for cautery..  She had no exacerbation with that pregnancy with the current pregnancy.   She denies gait or balance issues.   Strength is good.   She denies current numbness.    Vision is fine.   She notes that she is not always getting the urge to urinate but will go a normal amount  She notes some fatigue. She is sleeping well.   She denies depression.  Cognition is doing well.    She had Covid-19 last month and she is recovered.   She has not yet had the vaccination.         MS History   Around 05/20/2016, she had the onset of left eye pain, worse with movement.   Symptoms started while driving and she noted looking to merge was painful. She saw her  ophthalmologist and she had an MRI 06/03/2016 showing optic nerve enhancement on the left and a second focus in the right hemisphere that also enhanced.     She noted mild blurry vision shortly after that but it seemed worse 06/05/2016 and the next day, vision was worse.    She went to the [facility] ED and was admitted for 5 days of IV Solu-Medrol (3 days in hospital, 2 as outpatient).  There, she had an MRI of the cervical spine that showed 2 more lesions (at C4C5 and T1) consistent with MS.  In retrospect, a few weeks before the visual symptoms, she noted right hand numbness, especially if she hold her phone which is persisting.     I have reviewed the MRI of the brain dated 06/03/2016 and the MRI of the cervical spine dated 06/08/2016.   The MRI of the brain showed enhancement of the left optic nerve. Additionally in the right temporal lobes there is another T2/FLAIR hyperintense focus that enhances. Additionally there is a periventricular right cerebellar focus that does not enhance. I also reviewed the MRI of the cervical spine. At C3-C4 there is a right lateral focus with subtle enhancement. There is also a right posterolateral focus adjacent to T1 that does not enhance.   Lab reports from her recent  hospital stay were also evaluated. He is JCV antibody negative. Anti-NMO is negative.  She started Tysabri in March 2018.Marland Kitchen   She was started on Tysabri and did well.  She discontinued Tysabri summer 2020 after becoming pregnant.  She started Copaxone.  MRI of the brain after delivery March 2021 showed one new lesion that was not there previously.  She went back on Tysabri last month.  Imaging studies: MRI of the brain 07/09/2019 shows T2/flair hyperintense foci in the right cerebellar hemisphere and in the cerebral hemispheres in a pattern configuration consistent with chronic demyelinating plaque associated with multiple sclerosis.  None of the foci enhance or appear to be acute.  One focus in the right temporal lobe was  not apparent on the previous MRI from 08/16/2018.  I of the brain 08/16/2018 shows T2/flair hyperintense focus adjacent to the fourth ventricle in the right cerebellum and a couple smaller foci in the periventricular deep white matter of the hemispheres consistent with chronic demyelinating plaque associated with multiple sclerosis.  When compared to the MRI dated 09/21/2017, there are no new lesions.  MRI of the cervical spine 09/21/2017 shows small foci within the spinal cord posteriorly adjacent to C2-C3, to the right adjacent to C4 and posteriorly to the right adjacent to T1.  All these foci were present on the 06/08/2016 MRI and were more apparent on the previous MRI than the current scan.  These are consistent with small chronic demyelinating plaques associated with multiple sclerosis   REVIEW OF SYSTEMS: Constitutional: No fevers, chills, sweats, or change in appetite.   She denies much fatigue. Eyes: No visual changes, double vision, eye pain Ear, nose and throat: No hearing loss, ear pain, nasal congestion, sore throat Cardiovascular: No chest pain, palpitations Respiratory:  No shortness of breath at rest or with exertion.   No wheezes GastrointestinaI: No nausea, vomiting, diarrhea, abdominal pain, fecal incontinence,    Has constipation Genitourinary:  No dysuria, urinary retention or frequency.  No nocturia.  Has had some yeast infections.  Musculoskeletal:  No neck pain, back pain Integumentary: No rash, pruritus, skin lesions Neurological: as above Psychiatric: Mood is doing well. Endocrine: No palpitations, diaphoresis, change in appetite, change in weigh or increased thirst Hematologic/Lymphatic:  No anemia, purpura, petechiae. Allergic/Immunologic: No itchy/runny eyes, nasal congestion, recent allergic reactions, rashes  ALLERGIES: No Known Allergies  HOME MEDICATIONS:  Current Outpatient Medications:    COPAXONE 40 MG/ML SOSY, Inject 40 mg into the skin 3 (three) times a  week., Disp: 36 mL, Rfl: 3   Docosahexaenoic Acid (PRENATAL DHA) 200 MG CAPS, Prenatal + DHA, Disp: , Rfl:   PAST MEDICAL HISTORY: Past Medical History:  Diagnosis Date   Multiple sclerosis (HCC)    Vision abnormalities     PAST SURGICAL HISTORY: Past Surgical History:  Procedure Laterality Date   CESAREAN SECTION N/A 05/21/2019   Procedure: CESAREAN SECTION;  Surgeon: Ranae Pila, MD;  Location: [facility] LD ORS;  Service: Obstetrics;  Laterality: N/A;   CYSTOSCOPY N/A 05/22/2019   Procedure: CYSTOSCOPY;  Surgeon: Ranae Pila, MD;  Location: [facility] LD ORS;  Service: Gynecology;  Laterality: N/A;   LAPAROTOMY N/A 05/22/2019   Procedure: LAPAROTOMY;  Surgeon: Ranae Pila, MD;  Location: [facility] LD ORS;  Service: Gynecology;  Laterality: N/A;   LEEP     suppose to have follow up of cervical cells in January   MYRINGOTOMY     WISDOM TOOTH EXTRACTION      FAMILY HISTORY: Family History  Problem Relation Age of Onset   Heart disease Mother    Healthy Father    Diabetes Maternal Grandmother    Leukemia Paternal Grandfather     SOCIAL HISTORY:  Social History   Socioeconomic History   Marital status: Married    Spouse name: Not on file   Number of children: Not on file   Years of education: Not on file   Highest education level: Not on file  Occupational History   Not on file  Tobacco Use   Smoking status: Never   Smokeless tobacco: Never  Substance and Sexual Activity   Alcohol use: Not Currently    Comment: occasional   Drug use: No   Sexual activity: Yes    Birth control/protection: None  Other Topics Concern   Not on file  Social History Narrative   Not on file   Social Determinants of Health   Financial Resource Strain: Not on file  Food Insecurity: Not on file  Transportation Needs: Not on file  Physical Activity: Not on file  Stress: Not on file  Social Connections: Not on file  Intimate Partner Violence: Not on file     PHYSICAL  EXAM  BP 138/80   Pulse 92   Ht 5' 4.5" (1.638 m)   Wt 198 lb (89.8 kg)   BMI 33.46 kg/m    Body mass index is 33.46 kg/m.   General: The patient is well-developed and well-nourished and in no acute distress   HEENT:   Head is normocephalic and atraumatic.  The ear is no longer tender  Neurologic Exam  Mental status: She has a good affect.. The patient is alert and oriented x 3 at the time of the examination. The patient has apparent normal recent and remote memory, with an apparently normal attention span and concentration ability.   Speech is normal.  Cranial nerves: Extraocular movements are full.  Visual acuity and color vision is symmetric.  Facial strength and sensation is normal.  Trapezius strength is normal.  No obvious hearing deficits are noted.  Motor:  Muscle bulk is normal.   Tone is normal. Strength is  5 / 5 in all 4 extremities.   Sensory: Normal sensation to touch and vibration in the arms and legs.   Gait and station: Station is normal.  Gait and tandem gait are normal.  Reflexes: Deep tendon reflexes are symmetric and normal bilaterally.       DIAGNOSTIC DATA (LABS, IMAGING, TESTING) - I reviewed patient records, labs, notes, testing and imaging myself where available.  Lab Results  Component Value Date   WBC 11.8 (H) 11/26/2020   HGB 10.7 (L) 11/26/2020   HCT 32.7 (L) 11/26/2020   MCV 89.8 11/26/2020   PLT 224 11/26/2020      Component Value Date/Time   NA 133 (L) 05/22/2019 0409   NA 137 02/12/2019 0850   K 4.1 05/22/2019 0409   CL 104 04/27/2019 1804   CO2 23 04/27/2019 1804   GLUCOSE 73 04/27/2019 1804   BUN 5 (L) 04/27/2019 1804   BUN 7 02/12/2019 0850   CREATININE 0.54 04/27/2019 1804   CALCIUM 9.0 04/27/2019 1804   PROT 5.7 (L) 04/27/2019 1804   PROT 5.9 (L) 02/12/2019 0850   ALBUMIN 2.7 (L) 04/27/2019 1804   ALBUMIN 3.5 (L) 02/12/2019 0850   AST 19 04/27/2019 1804   ALT 19 04/27/2019 1804   ALKPHOS 117 04/27/2019 1804    BILITOT 0.5 04/27/2019 1804   BILITOT <0.2  02/12/2019 0850   GFRNONAA >60 04/27/2019 1804   GFRAA >60 04/27/2019 1804       ASSESSMENT AND PLAN  Multiple sclerosis (HCC) - Plan: Stratify JCV Antibody Test ([facility]), CBC with Differential/Platelet, MR BRAIN W WO CONTRAST  High risk medication use - Plan: Stratify JCV Antibody Test ([facility]), CBC with Differential/Platelet  Pregnancy, unspecified gestational age  Optic neuritis  Numbness - Plan: MR BRAIN W WO CONTRAST  Bilateral carpal tunnel syndrome    1.   Continue Copaxone during pregnnancy and immediae post-partum until Tysabri which will resume shortly after delivery.  We also need to check a MRI after delivery to determine if there has been any subclinical progression 2.   She appears to have bilateral CTS, R>L.  I will do a right carpal tunnel injection with 1 mg decadron in 1 cc lidocaine suing sterile technique.    3.   Stay active and exercise as tolerated.   4.  Return in 5-6 months or sooner if there are new or worsening neurologic symptoms and depending on options of treatment.       Sendy Pluta A. Epimenio Foot, MD, PhD 12/29/2020, 2:21 PM   Certified in Neurology, Clinical Neurophysiology, Sleep Medicine, Pain Medicine and Neuroimaging  [facility] [address] [place], [place] [address] [phone]

## 2020-12-29 NOTE — Telephone Encounter (Signed)
Placed JCV lab in [facility] lock box for routine lab pick up. Results pending. 

## 2020-12-30 LAB — CBC WITH DIFFERENTIAL/PLATELET
Basophils Absolute: 0 10*3/uL (ref 0.0–0.2)
Basos: 0 %
EOS (ABSOLUTE): 0.1 10*3/uL (ref 0.0–0.4)
Eos: 1 %
Hematocrit: 32.8 % — ABNORMAL LOW (ref 34.0–46.6)
Hemoglobin: 10.9 g/dL — ABNORMAL LOW (ref 11.1–15.9)
Immature Grans (Abs): 0.1 10*3/uL (ref 0.0–0.1)
Immature Granulocytes: 1 %
Lymphocytes Absolute: 2.1 10*3/uL (ref 0.7–3.1)
Lymphs: 22 %
MCH: 29.4 pg (ref 26.6–33.0)
MCHC: 33.2 g/dL (ref 31.5–35.7)
MCV: 88 fL (ref 79–97)
Monocytes Absolute: 0.6 10*3/uL (ref 0.1–0.9)
Monocytes: 7 %
Neutrophils Absolute: 6.5 10*3/uL (ref 1.4–7.0)
Neutrophils: 69 %
Platelets: 217 10*3/uL (ref 150–450)
RBC: 3.71 x10E6/uL — ABNORMAL LOW (ref 3.77–5.28)
RDW: 13 % (ref 11.7–15.4)
WBC: 9.4 10*3/uL (ref 3.4–10.8)

## 2020-12-30 NOTE — Patient Instructions (Addendum)
Brittany Johnston  12/30/2020   Your procedure is scheduled on:  01/09/2021  Arrive at 0530 at Entrance C on [address] at [facility]  and [employer]. You are invited to use the FREE valet parking or use the Visitor's parking deck.  Pick up the phone at the desk and dial [phone].  Call this number if you have problems the morning of surgery: [phone]  Remember:   Do not eat food:(After Midnight) Desps de medianoche.  Do not drink clear liquids: (After Midnight) Desps de medianoche.  Take these medicines the morning of surgery with A SIP OF WATER:  Take Copaxone as prescribed   Do not wear jewelry, make-up or nail polish.  Do not wear lotions, powders, or perfumes. Do not wear deodorant.  Do not shave 48 hours prior to surgery.  Do not bring valuables to the hospital.  [facility] is not   responsible for any belongings or valuables brought to the hospital.  Contacts, dentures or bridgework may not be worn into surgery.  Leave suitcase in the car. After surgery it may be brought to your room.  For patients admitted to the hospital, checkout time is 11:00 AM the day of              discharge.      Please read over the following fact sheets that you were given:     Preparing for Surgery

## 2020-12-31 ENCOUNTER — Encounter (HOSPITAL_COMMUNITY): Payer: Self-pay

## 2020-12-31 ENCOUNTER — Telehealth: Payer: Self-pay | Admitting: Neurology

## 2020-12-31 NOTE — Telephone Encounter (Signed)
[employer] order sent to GI. They will reach out to the patient to schedule.  

## 2021-01-02 NOTE — Telephone Encounter (Signed)
JCV ab drawn on 12/29/20 negative, index: 0.09

## 2021-01-07 ENCOUNTER — Other Ambulatory Visit: Payer: Self-pay

## 2021-01-07 ENCOUNTER — Other Ambulatory Visit: Payer: Self-pay | Admitting: Obstetrics and Gynecology

## 2021-01-07 ENCOUNTER — Encounter (HOSPITAL_COMMUNITY)
Admission: RE | Admit: 2021-01-07 | Discharge: 2021-01-07 | Disposition: A | Discharge status: Home / Self Care | Payer: No Typology Code available for payment source | Source: Ambulatory Visit | Attending: Obstetrics and Gynecology | Admitting: Obstetrics and Gynecology

## 2021-01-07 DIAGNOSIS — Z01812 Encounter for preprocedural laboratory examination: Secondary | ICD-10-CM | POA: Insufficient documentation

## 2021-01-07 DIAGNOSIS — Z20822 Contact with and (suspected) exposure to covid-19: Secondary | ICD-10-CM | POA: Insufficient documentation

## 2021-01-07 HISTORY — DX: Unspecified abnormal cytological findings in specimens from vagina: R87.629

## 2021-01-07 LAB — CBC
HCT: 34.9 % — ABNORMAL LOW (ref 36.0–46.0)
Hemoglobin: 11.1 g/dL — ABNORMAL LOW (ref 12.0–15.0)
MCH: 29.1 pg (ref 26.0–34.0)
MCHC: 31.8 g/dL (ref 30.0–36.0)
MCV: 91.6 fL (ref 80.0–100.0)
Platelets: 219 10*3/uL (ref 150–400)
RBC: 3.81 MIL/uL — ABNORMAL LOW (ref 3.87–5.11)
RDW: 13.9 % (ref 11.5–15.5)
WBC: 9.2 10*3/uL (ref 4.0–10.5)
nRBC: 0 % (ref 0.0–0.2)

## 2021-01-07 LAB — SARS CORONAVIRUS 2 (TAT 6-24 HRS): SARS Coronavirus 2: NEGATIVE

## 2021-01-07 LAB — TYPE AND SCREEN
ABO/RH(D): O POS
Antibody Screen: NEGATIVE

## 2021-01-08 ENCOUNTER — Encounter (HOSPITAL_COMMUNITY): Payer: Self-pay | Admitting: Obstetrics and Gynecology

## 2021-01-08 LAB — RPR: RPR Ser Ql: NONREACTIVE

## 2021-01-08 NOTE — Anesthesia Preprocedure Evaluation (Addendum)
Anesthesia Evaluation    Reviewed: Allergy & Precautions, Patient's Chart, lab work & pertinent test results  Airway Mallampati: I  TM Distance: >3 FB Neck ROM: Full    Dental no notable dental hx. (+) Teeth Intact, Dental Advisory Given   Pulmonary neg pulmonary ROS,    Pulmonary exam normal breath sounds clear to auscultation       Cardiovascular negative cardio ROS Normal cardiovascular exam Rhythm:Regular Rate:Normal     Neuro/Psych PSYCHIATRIC DISORDERS Anxiety Depression Multiple sclerosis w history of optic neuritis   Neuromuscular disease    GI/Hepatic Neg liver ROS,   Endo/Other    Renal/GU      Musculoskeletal   Abdominal   Peds  Hematology  (+) Blood dyscrasia, anemia ,   Anesthesia Other Findings   Reproductive/Obstetrics (+) Pregnancy                            Anesthesia Physical  Anesthesia Plan  ASA: 2  Anesthesia Plan: Spinal   Post-op Pain Management:    Induction:   PONV Risk Score and Plan: 2  Airway Management Planned: Natural Airway  Additional Equipment:   Intra-op Plan:   Post-operative Plan:   Informed Consent: I have reviewed the patients History and Physical, chart, labs and discussed the procedure including the risks, benefits and alternatives for the proposed anesthesia with the patient or authorized representative who has indicated his/her understanding and acceptance.       Plan Discussed with: Anesthesiologist  Anesthesia Plan Comments: ( )        Anesthesia Quick Evaluation

## 2021-01-09 ENCOUNTER — Inpatient Hospital Stay (HOSPITAL_COMMUNITY)
Admission: RE | Admit: 2021-01-09 | Discharge: 2021-01-11 | DRG: 787 | Disposition: A | Discharge status: Home / Self Care | Payer: No Typology Code available for payment source | Attending: Obstetrics and Gynecology | Admitting: Obstetrics and Gynecology

## 2021-01-09 ENCOUNTER — Encounter (HOSPITAL_COMMUNITY)
Admission: RE | Disposition: A | Discharge status: Home / Self Care | Payer: Self-pay | Source: Home / Self Care | Attending: Obstetrics and Gynecology

## 2021-01-09 ENCOUNTER — Encounter (HOSPITAL_COMMUNITY): Payer: Self-pay | Admitting: Obstetrics and Gynecology

## 2021-01-09 ENCOUNTER — Inpatient Hospital Stay (HOSPITAL_COMMUNITY): Payer: No Typology Code available for payment source | Admitting: Anesthesiology

## 2021-01-09 ENCOUNTER — Other Ambulatory Visit: Payer: Self-pay

## 2021-01-09 DIAGNOSIS — O34211 Maternal care for low transverse scar from previous cesarean delivery: Secondary | ICD-10-CM | POA: Diagnosis present

## 2021-01-09 DIAGNOSIS — Z23 Encounter for immunization: Secondary | ICD-10-CM

## 2021-01-09 DIAGNOSIS — Z3490 Encounter for supervision of normal pregnancy, unspecified, unspecified trimester: Secondary | ICD-10-CM

## 2021-01-09 DIAGNOSIS — G35 Multiple sclerosis: Secondary | ICD-10-CM | POA: Diagnosis present

## 2021-01-09 DIAGNOSIS — O9081 Anemia of the puerperium: Secondary | ICD-10-CM | POA: Diagnosis not present

## 2021-01-09 DIAGNOSIS — Z98891 History of uterine scar from previous surgery: Secondary | ICD-10-CM

## 2021-01-09 DIAGNOSIS — D62 Acute posthemorrhagic anemia: Secondary | ICD-10-CM | POA: Diagnosis not present

## 2021-01-09 DIAGNOSIS — O99354 Diseases of the nervous system complicating childbirth: Secondary | ICD-10-CM | POA: Diagnosis present

## 2021-01-09 DIAGNOSIS — Z3A39 39 weeks gestation of pregnancy: Secondary | ICD-10-CM | POA: Diagnosis not present

## 2021-01-09 SURGERY — Surgical Case
Anesthesia: Spinal

## 2021-01-09 MED ORDER — ONDANSETRON HCL 4 MG/2ML IJ SOLN: 4.0000 mg | Freq: Three times a day (TID) | INTRAMUSCULAR | Status: DC | PRN

## 2021-01-09 MED ORDER — SODIUM CHLORIDE 0.9% FLUSH: 3.0000 mL | INTRAVENOUS | Status: DC | PRN

## 2021-01-09 MED ORDER — PRENATAL MULTIVITAMIN CH
1.0000 | ORAL_TABLET | Freq: Every day | ORAL | Status: DC
  Administered 2021-01-09 – 2021-01-11 (×3): 1 via ORAL
  Filled 2021-01-09 (×3): qty 1

## 2021-01-09 MED ORDER — GLATIRAMER ACETATE 40 MG/ML ~~LOC~~ SOSY
40.0000 mg | PREFILLED_SYRINGE | SUBCUTANEOUS | Status: DC
  Administered 2021-01-10: 40 mg via SUBCUTANEOUS
  Filled 2021-01-09 (×4): qty 1

## 2021-01-09 MED ORDER — LACTATED RINGERS IV SOLN: INTRAVENOUS | Status: DC

## 2021-01-09 MED ORDER — DIPHENHYDRAMINE HCL 25 MG PO CAPS: 25.0000 mg | ORAL_CAPSULE | ORAL | Status: DC | PRN

## 2021-01-09 MED ORDER — STERILE WATER FOR IRRIGATION IR SOLN
Status: DC | PRN
  Administered 2021-01-09: 1

## 2021-01-09 MED ORDER — SCOPOLAMINE 1 MG/3DAYS TD PT72
1.0000 | MEDICATED_PATCH | Freq: Once | TRANSDERMAL | Status: DC
  Administered 2021-01-09: 1.5 mg via TRANSDERMAL

## 2021-01-09 MED ORDER — SODIUM CHLORIDE 0.9 % IR SOLN
Status: DC | PRN
  Administered 2021-01-09: 1

## 2021-01-09 MED ORDER — PHENYLEPHRINE 40 MCG/ML (10ML) SYRINGE FOR IV PUSH (FOR BLOOD PRESSURE SUPPORT)
PREFILLED_SYRINGE | INTRAVENOUS | Status: AC
  Filled 2021-01-09: qty 10

## 2021-01-09 MED ORDER — DIPHENHYDRAMINE HCL 50 MG/ML IJ SOLN
12.5000 mg | INTRAMUSCULAR | Status: DC | PRN
  Administered 2021-01-09: 12.5 mg via INTRAVENOUS

## 2021-01-09 MED ORDER — SIMETHICONE 80 MG PO CHEW: 80.0000 mg | CHEWABLE_TABLET | ORAL | Status: DC | PRN

## 2021-01-09 MED ORDER — OXYCODONE HCL 5 MG PO TABS
5.0000 mg | ORAL_TABLET | ORAL | Status: DC | PRN
  Administered 2021-01-10: 10 mg via ORAL
  Administered 2021-01-10 (×2): 5 mg via ORAL
  Administered 2021-01-11: 10 mg via ORAL
  Filled 2021-01-09: qty 2
  Filled 2021-01-09: qty 1
  Filled 2021-01-09: qty 2
  Filled 2021-01-09: qty 1

## 2021-01-09 MED ORDER — ONDANSETRON HCL 4 MG/2ML IJ SOLN
INTRAMUSCULAR | Status: AC
  Filled 2021-01-09: qty 2

## 2021-01-09 MED ORDER — PHENYLEPHRINE HCL-NACL 20-0.9 MG/250ML-% IV SOLN
INTRAVENOUS | Status: AC
  Filled 2021-01-09: qty 250

## 2021-01-09 MED ORDER — NALOXONE HCL 0.4 MG/ML IJ SOLN: 0.4000 mg | INTRAMUSCULAR | Status: DC | PRN

## 2021-01-09 MED ORDER — SCOPOLAMINE 1 MG/3DAYS TD PT72
MEDICATED_PATCH | TRANSDERMAL | Status: AC
  Filled 2021-01-09: qty 1

## 2021-01-09 MED ORDER — MORPHINE SULFATE (PF) 0.5 MG/ML IJ SOLN
INTRAMUSCULAR | Status: AC
  Filled 2021-01-09: qty 10

## 2021-01-09 MED ORDER — DEXAMETHASONE SODIUM PHOSPHATE 10 MG/ML IJ SOLN
INTRAMUSCULAR | Status: AC
  Filled 2021-01-09: qty 1

## 2021-01-09 MED ORDER — KETOROLAC TROMETHAMINE 30 MG/ML IJ SOLN
30.0000 mg | Freq: Four times a day (QID) | INTRAMUSCULAR | Status: DC | PRN
Start: 2021-01-09 — End: 2021-01-09

## 2021-01-09 MED ORDER — NALBUPHINE HCL 10 MG/ML IJ SOLN
5.0000 mg | INTRAMUSCULAR | Status: DC | PRN
  Administered 2021-01-09 (×3): 5 mg via INTRAVENOUS
  Filled 2021-01-09 (×5): qty 1

## 2021-01-09 MED ORDER — DIPHENHYDRAMINE HCL 25 MG PO CAPS: 25.0000 mg | ORAL_CAPSULE | Freq: Four times a day (QID) | ORAL | Status: DC | PRN

## 2021-01-09 MED ORDER — CEFAZOLIN SODIUM-DEXTROSE 2-4 GM/100ML-% IV SOLN
INTRAVENOUS | Status: AC
  Filled 2021-01-09: qty 100

## 2021-01-09 MED ORDER — MORPHINE SULFATE (PF) 0.5 MG/ML IJ SOLN
INTRAMUSCULAR | Status: DC | PRN
  Administered 2021-01-09: 150 ug via INTRATHECAL

## 2021-01-09 MED ORDER — SOD CITRATE-CITRIC ACID 500-334 MG/5ML PO SOLN
ORAL | Status: AC
  Filled 2021-01-09: qty 30

## 2021-01-09 MED ORDER — NALBUPHINE HCL 10 MG/ML IJ SOLN
5.0000 mg | INTRAMUSCULAR | Status: DC | PRN
  Administered 2021-01-10 – 2021-01-11 (×3): 5 mg via SUBCUTANEOUS
  Filled 2021-01-09 (×2): qty 1

## 2021-01-09 MED ORDER — OXYTOCIN-SODIUM CHLORIDE 30-0.9 UT/500ML-% IV SOLN: 2.5000 [IU]/h | INTRAVENOUS | Status: AC

## 2021-01-09 MED ORDER — BUPIVACAINE IN DEXTROSE 0.75-8.25 % IT SOLN
INTRATHECAL | Status: DC | PRN
Start: 2021-01-09 — End: 2021-01-09
  Administered 2021-01-09: 1.6 mL via INTRATHECAL

## 2021-01-09 MED ORDER — DIBUCAINE (PERIANAL) 1 % EX OINT: 1.0000 "application " | TOPICAL_OINTMENT | CUTANEOUS | Status: DC | PRN

## 2021-01-09 MED ORDER — MEPERIDINE HCL 25 MG/ML IJ SOLN: 6.2500 mg | INTRAMUSCULAR | Status: DC | PRN

## 2021-01-09 MED ORDER — ONDANSETRON HCL 4 MG/2ML IJ SOLN
INTRAMUSCULAR | Status: DC | PRN
  Administered 2021-01-09: 4 mg via INTRAVENOUS

## 2021-01-09 MED ORDER — OXYTOCIN-SODIUM CHLORIDE 30-0.9 UT/500ML-% IV SOLN
INTRAVENOUS | Status: AC
  Filled 2021-01-09: qty 500

## 2021-01-09 MED ORDER — WITCH HAZEL-GLYCERIN EX PADS: 1.0000 "application " | MEDICATED_PAD | CUTANEOUS | Status: DC | PRN

## 2021-01-09 MED ORDER — HYDROMORPHONE HCL 1 MG/ML IJ SOLN: 0.2000 mg | INTRAMUSCULAR | Status: DC | PRN

## 2021-01-09 MED ORDER — MEASLES, MUMPS & RUBELLA VAC IJ SOLR
0.5000 mL | Freq: Once | INTRAMUSCULAR | Status: AC
  Administered 2021-01-11: 0.5 mL via SUBCUTANEOUS
  Filled 2021-01-09: qty 0.5

## 2021-01-09 MED ORDER — SENNOSIDES-DOCUSATE SODIUM 8.6-50 MG PO TABS
2.0000 | ORAL_TABLET | Freq: Every day | ORAL | Status: DC
  Administered 2021-01-10 – 2021-01-11 (×2): 2 via ORAL
  Filled 2021-01-09 (×2): qty 2

## 2021-01-09 MED ORDER — KETOROLAC TROMETHAMINE 30 MG/ML IJ SOLN
30.0000 mg | Freq: Four times a day (QID) | INTRAMUSCULAR | Status: DC | PRN
  Administered 2021-01-09: 30 mg via INTRAMUSCULAR

## 2021-01-09 MED ORDER — ZOLPIDEM TARTRATE 5 MG PO TABS: 5.0000 mg | ORAL_TABLET | Freq: Every evening | ORAL | Status: DC | PRN

## 2021-01-09 MED ORDER — ACETAMINOPHEN 10 MG/ML IV SOLN
INTRAVENOUS | Status: DC | PRN
  Administered 2021-01-09: 1000 mg via INTRAVENOUS

## 2021-01-09 MED ORDER — FENTANYL CITRATE (PF) 100 MCG/2ML IJ SOLN
INTRAMUSCULAR | Status: DC | PRN
  Administered 2021-01-09: 15 ug via INTRATHECAL

## 2021-01-09 MED ORDER — CEFAZOLIN SODIUM-DEXTROSE 2-4 GM/100ML-% IV SOLN
2.0000 g | INTRAVENOUS | Status: AC
  Administered 2021-01-09: 2 g via INTRAVENOUS

## 2021-01-09 MED ORDER — KETOROLAC TROMETHAMINE 30 MG/ML IJ SOLN
INTRAMUSCULAR | Status: AC
  Filled 2021-01-09: qty 1

## 2021-01-09 MED ORDER — ACETAMINOPHEN 500 MG PO TABS
1000.0000 mg | ORAL_TABLET | Freq: Four times a day (QID) | ORAL | Status: DC
  Administered 2021-01-09 – 2021-01-11 (×8): 1000 mg via ORAL
  Filled 2021-01-09 (×8): qty 2

## 2021-01-09 MED ORDER — MENTHOL 3 MG MT LOZG: 1.0000 | LOZENGE | OROMUCOSAL | Status: DC | PRN

## 2021-01-09 MED ORDER — PHENYLEPHRINE HCL-NACL 20-0.9 MG/250ML-% IV SOLN
INTRAVENOUS | Status: DC | PRN
  Administered 2021-01-09: 60 ug/min via INTRAVENOUS

## 2021-01-09 MED ORDER — COCONUT OIL OIL: 1.0000 "application " | TOPICAL_OIL | Status: DC | PRN

## 2021-01-09 MED ORDER — IBUPROFEN 600 MG PO TABS
600.0000 mg | ORAL_TABLET | Freq: Four times a day (QID) | ORAL | Status: DC
  Administered 2021-01-10 – 2021-01-11 (×5): 600 mg via ORAL
  Filled 2021-01-09 (×5): qty 1

## 2021-01-09 MED ORDER — OXYTOCIN-SODIUM CHLORIDE 30-0.9 UT/500ML-% IV SOLN
INTRAVENOUS | Status: DC | PRN
  Administered 2021-01-09 (×2): 30 [IU] via INTRAVENOUS

## 2021-01-09 MED ORDER — NALBUPHINE HCL 10 MG/ML IJ SOLN
5.0000 mg | Freq: Once | INTRAMUSCULAR | Status: AC | PRN
Start: 2021-01-09 — End: 2021-01-10
  Administered 2021-01-10: 5 mg via INTRAVENOUS

## 2021-01-09 MED ORDER — ACETAMINOPHEN 10 MG/ML IV SOLN
INTRAVENOUS | Status: AC
  Filled 2021-01-09: qty 100

## 2021-01-09 MED ORDER — NALBUPHINE HCL 10 MG/ML IJ SOLN: 5.0000 mg | Freq: Once | INTRAMUSCULAR | Status: AC | PRN

## 2021-01-09 MED ORDER — DIPHENHYDRAMINE HCL 50 MG/ML IJ SOLN
INTRAMUSCULAR | Status: AC
  Filled 2021-01-09: qty 1

## 2021-01-09 MED ORDER — BUPIVACAINE HCL (PF) 0.75 % IJ SOLN: INTRAMUSCULAR | Status: DC | PRN

## 2021-01-09 MED ORDER — SOD CITRATE-CITRIC ACID 500-334 MG/5ML PO SOLN
30.0000 mL | ORAL | Status: AC
  Administered 2021-01-09: 30 mL via ORAL

## 2021-01-09 MED ORDER — DEXTROSE 5 % IV SOLN
1.0000 ug/kg/h | INTRAVENOUS | Status: DC | PRN
  Filled 2021-01-09: qty 5

## 2021-01-09 MED ORDER — FENTANYL CITRATE (PF) 100 MCG/2ML IJ SOLN
INTRAMUSCULAR | Status: AC
  Filled 2021-01-09: qty 2

## 2021-01-09 MED ORDER — KETOROLAC TROMETHAMINE 30 MG/ML IJ SOLN
30.0000 mg | Freq: Four times a day (QID) | INTRAMUSCULAR | Status: AC
  Administered 2021-01-09 – 2021-01-10 (×3): 30 mg via INTRAVENOUS
  Filled 2021-01-09 (×3): qty 1

## 2021-01-09 MED ORDER — SIMETHICONE 80 MG PO CHEW
80.0000 mg | CHEWABLE_TABLET | Freq: Three times a day (TID) | ORAL | Status: DC
  Administered 2021-01-09 – 2021-01-11 (×6): 80 mg via ORAL
  Filled 2021-01-09 (×6): qty 1

## 2021-01-09 MED ORDER — PHENYLEPHRINE HCL (PRESSORS) 10 MG/ML IV SOLN
INTRAVENOUS | Status: DC | PRN
  Administered 2021-01-09 (×2): 40 ug via INTRAVENOUS

## 2021-01-09 MED ORDER — DEXAMETHASONE SODIUM PHOSPHATE 4 MG/ML IJ SOLN
INTRAMUSCULAR | Status: DC | PRN
  Administered 2021-01-09: 10 mg via INTRAVENOUS

## 2021-01-09 SURGICAL SUPPLY — 37 items
APL SKNCLS STERI-STRIP NONHPOA (GAUZE/BANDAGES/DRESSINGS) ×1
BENZOIN TINCTURE PRP APPL 2/3 (GAUZE/BANDAGES/DRESSINGS) ×2 IMPLANT
CHLORAPREP W/TINT 26ML (MISCELLANEOUS) ×2 IMPLANT
CLAMP CORD UMBIL (MISCELLANEOUS) IMPLANT
CLOTH BEACON ORANGE TIMEOUT ST (SAFETY) ×2 IMPLANT
CLSR STERI-STRIP ANTIMIC 1/2X4 (GAUZE/BANDAGES/DRESSINGS) ×2 IMPLANT
DRSG OPSITE POSTOP 4X10 (GAUZE/BANDAGES/DRESSINGS) ×2 IMPLANT
ELECT REM PT RETURN 9FT ADLT (ELECTROSURGICAL) ×2
ELECTRODE REM PT RTRN 9FT ADLT (ELECTROSURGICAL) ×1 IMPLANT
EXTRACTOR VACUUM KIWI (MISCELLANEOUS) ×1 IMPLANT
GLOVE BIO SURGEON STRL SZ 6.5 (GLOVE) ×2 IMPLANT
GLOVE BIOGEL PI IND STRL 6.5 (GLOVE) ×1 IMPLANT
GLOVE BIOGEL PI IND STRL 7.0 (GLOVE) ×2 IMPLANT
GLOVE BIOGEL PI INDICATOR 6.5 (GLOVE) ×1
GLOVE BIOGEL PI INDICATOR 7.0 (GLOVE) ×2
GOWN STRL REUS W/TWL LRG LVL3 (GOWN DISPOSABLE) ×4 IMPLANT
HEMOSTAT ARISTA ABSORB 3G PWDR (HEMOSTASIS) ×1 IMPLANT
KIT ABG SYR 3ML LUER SLIP (SYRINGE) ×2 IMPLANT
NDL HYPO 25X5/8 SAFETYGLIDE (NEEDLE) ×1 IMPLANT
NEEDLE HYPO 25X5/8 SAFETYGLIDE (NEEDLE) ×2 IMPLANT
NS IRRIG 1000ML POUR BTL (IV SOLUTION) ×2 IMPLANT
PACK C SECTION WH (CUSTOM PROCEDURE TRAY) ×2 IMPLANT
PAD OB MATERNITY 4.3X12.25 (PERSONAL CARE ITEMS) ×2 IMPLANT
PENCIL SMOKE EVAC W/HOLSTER (ELECTROSURGICAL) ×2 IMPLANT
STRIP CLOSURE SKIN 1/2X4 (GAUZE/BANDAGES/DRESSINGS) ×1 IMPLANT
SUT PLAIN 0 NONE (SUTURE) IMPLANT
SUT PLAIN 2 0 (SUTURE) ×2
SUT PLAIN ABS 2-0 CT1 27XMFL (SUTURE) ×1 IMPLANT
SUT VIC AB 0 CT1 36 (SUTURE) ×2 IMPLANT
SUT VIC AB 0 CTX 36 (SUTURE) ×4
SUT VIC AB 0 CTX36XBRD ANBCTRL (SUTURE) ×2 IMPLANT
SUT VIC AB 4-0 PS2 27 (SUTURE) ×2 IMPLANT
SUT VIC AB 4-0 SH 27 (SUTURE) ×2
SUT VIC AB 4-0 SH 27XANBCTRL (SUTURE) IMPLANT
TOWEL OR 17X24 6PK STRL BLUE (TOWEL DISPOSABLE) ×2 IMPLANT
TRAY FOLEY W/BAG SLVR 14FR LF (SET/KITS/TRAYS/PACK) IMPLANT
WATER STERILE IRR 1000ML POUR (IV SOLUTION) ×2 IMPLANT

## 2021-01-09 NOTE — Anesthesia Procedure Notes (Signed)
Spinal  Patient location during procedure: OR Start time: 01/09/2021 7:29 AM End time: 01/09/2021 7:35 AM Reason for block: surgical anesthesia Staffing Anesthesiologist: Bethena Midget, MD Preanesthetic Checklist Completed: patient identified, IV checked, site marked, risks and benefits discussed, surgical consent, monitors and equipment checked, pre-op evaluation and timeout performed Spinal Block Patient position: sitting Prep: DuraPrep Patient monitoring: heart rate, cardiac monitor, continuous pulse ox and blood pressure Approach: midline Location: L3-4 Injection technique: single-shot Needle Needle type: Sprotte  Needle gauge: 24 G Needle length: 9 cm Assessment Sensory level: T4 Events: CSF return

## 2021-01-09 NOTE — Anesthesia Postprocedure Evaluation (Signed)
Anesthesia Post Note  Patient: [name]  Procedure(s) Performed: REPEAT CESAREAN SECTION EDC: 01-16-21 ALLERG: NKDA  PREVIOUS X 1     Patient location during evaluation: PACU Anesthesia Type: Spinal Level of consciousness: oriented and awake and alert Pain management: pain level controlled Vital Signs Assessment: post-procedure vital signs reviewed and stable Respiratory status: spontaneous breathing, respiratory function stable and patient connected to nasal cannula oxygen Cardiovascular status: blood pressure returned to baseline and stable Postop Assessment: no headache, no backache and no apparent nausea or vomiting Anesthetic complications: no   No notable events documented.  Last Vitals:  Vitals:   01/09/21 1213 01/09/21 1324  BP: (!) 100/52 (!) 96/57  Pulse: (!) 58 62  Resp: 18 18  Temp: 36.4 C   SpO2: 97% 99%    Last Pain:  Vitals:   01/09/21 1445  TempSrc:   PainSc: 5    Pain Goal:                   Noam Karaffa

## 2021-01-09 NOTE — Progress Notes (Signed)
No updates to above H&P. Patient arrived NPO and was consented in PACU. Risks again discussed, all questions answered, and consent signed. Proceed with above surgery.    Eitan Doubleday MD  

## 2021-01-09 NOTE — Transfer of Care (Signed)
Immediate Anesthesia Transfer of Care Note  Patient: Brittany Johnston  Procedure(s) Performed: REPEAT CESAREAN SECTION EDC: 01-16-21 ALLERG: NKDA  PREVIOUS X 1  Patient Location: PACU  Anesthesia Type:Spinal  Level of Consciousness: awake, alert  and oriented  Airway & Oxygen Therapy: Patient Spontanous Breathing  Post-op Assessment: Report given to RN and Post -op Vital signs reviewed and stable  Post vital signs: Reviewed and stable  Last Vitals:  Vitals Value Taken Time  BP 99/69 01/09/21 0930  Temp 36.4 C 01/09/21 0911  Pulse 76 01/09/21 0942  Resp 19 01/09/21 0942  SpO2 100 % 01/09/21 0942  Vitals shown include unvalidated device data.  Last Pain:  Vitals:   01/09/21 0930  TempSrc:   PainSc: 0-No pain         Complications: No notable events documented.

## 2021-01-09 NOTE — Op Note (Signed)
PROCEDURE DATE: 01/09/21   PREOPERATIVE DIAGNOSIS: Repeat cesarean section   POSTOPERATIVE DIAGNOSIS: The same   PROCEDURE:    Repeat Low Transverse Cesarean Section   SURGEON:  Dr. Belva Agee   INDICATIONS: This is a 35yo G2P2 at 41 wga requiring cesarean section secondary to desires repeat.  Decision made to proceed with LTCS. The risks of cesarean section discussed with the patient included but were not limited to: bleeding which may require transfusion or reoperation; infection which may require antibiotics; injury to bowel, bladder, ureters or other surrounding organs; injury to the fetus; need for additional procedures including hysterectomy in the event of a life-threatening hemorrhage; placental abnormalities wth subsequent pregnancies, incisional problems, thromboembolic phenomenon and other postoperative/anesthesia complications. The patient agreed with the proposed plan, giving informed consent for the procedure.     FINDINGS:  Viable female infant in vertex presentation, APGARs pending,  Weight pending, Amniotic fluid clear,  Intact placenta, three vessel cord.  Grossly normal uterus. .   ANESTHESIA:    Epidural ESTIMATED BLOOD LOSS: 387cc SPECIMENS: Placenta for routine COMPLICATIONS: None immediate   PROCEDURE IN DETAIL:  The patient received intravenous antibiotics (2g Ancef) and had sequential compression devices applied to her lower extremities while in the preoperative area.  She was then taken to the operating room where epidural anesthesia was dosed up to surgical level and was found to be adequate. She was then placed in a dorsal supine position with a leftward tilt, and prepped and draped in a sterile manner.  A foley catheter was placed into her bladder and attached to constant gravity.  After an adequate timeout was performed, a Pfannenstiel skin incision was made with scalpel and carried through to the underlying layer of fascia. The fascia was incised in the midline and  this incision was extended bilaterally using the Mayo scissors. Kocher clamps were applied to the superior aspect of the fascial incision and the underlying rectus muscles were dissected off bluntly. A similar process was carried out on the inferior aspect of the facial incision. The rectus muscles were separated in the midline bluntly and the peritoneum was entered bluntly.  A bladder flap was created sharply and developed bluntly. A transverse hysterotomy was made with a scalpel and extended bilaterally bluntly. The bladder blade was then removed. In order to facilitate delivery of large fetal vertex and get around her sharp pubic arch, the fasica had to be extended sharply and skin incision had to be extended. Vacuum assistance was needed with two pop offs.The infant was successfully delivered and cord was clamped and cut and infant was handed over to awaiting neonatology team. Uterine massage was then administered and the placenta delivered intact with three-vessel cord. Cord gases were taken. The uterus was cleared of clot and debris.  The hysterotomy was closed with 0 vicryl.  A second imbricating suture of 0-vicryl was used to reinforce the incision and aid in hemostasis. The bladder was noted to be slightly denuded without any obvious tears upon thorough inspection. Thus, one figure of eight stitch placed to help with oozing denudation. UOP noted to be good and clear. Arista placed and hemostasis achieved. The fascia was closed with 0-Vicryl in a running fashion with good restoration of anatomy.  The subcutaneus tissue was irrigated and was reapproximated using three interrupted plain gut stitches.  The skin was closed with 4-0 Vicryl in a subcuticular fashion. Wound vac was placed.   All surgical site and was hemostatic at end of procedure) without any  further bleeding on exam.   It's a boy - "Richard "BLAKE""  Pt tolerated the procedure well. All sponge/lap/needle counts were correct  X 2. Pt taken to  recovery room in stable condition.     Belva Agee MD

## 2021-01-10 LAB — CBC
HCT: 27.7 % — ABNORMAL LOW (ref 36.0–46.0)
Hemoglobin: 9 g/dL — ABNORMAL LOW (ref 12.0–15.0)
MCH: 29.6 pg (ref 26.0–34.0)
MCHC: 32.5 g/dL (ref 30.0–36.0)
MCV: 91.1 fL (ref 80.0–100.0)
Platelets: 180 10*3/uL (ref 150–400)
RBC: 3.04 MIL/uL — ABNORMAL LOW (ref 3.87–5.11)
RDW: 13.7 % (ref 11.5–15.5)
WBC: 13 10*3/uL — ABNORMAL HIGH (ref 4.0–10.5)
nRBC: 0 % (ref 0.0–0.2)

## 2021-01-10 NOTE — Progress Notes (Signed)
Subjective: Postpartum Day 1: Cesarean Delivery (RCS) Patient reports tolerating PO and no problems voiding.    Objective: Vital signs in last 24 hours: Temp:  [97.4 F (36.3 C)-97.9 F (36.6 C)] 97.9 F (36.6 C) (09/10 0220) Pulse Rate:  [50-92] 55 (09/10 0220) Resp:  [15-27] 18 (09/10 0220) BP: (92-115)/(50-89) 92/55 (09/10 0220) SpO2:  [97 %-100 %] 99 % (09/09 1830) Weight:  [91 kg] 91 kg (09/09 0545)  Physical Exam:  General: alert Lochia: appropriate Uterine Fundus: firm Incision: healing well, wound vac in place DVT Evaluation: NO evidence of DVT  Recent Labs    01/07/21 0920 01/10/21 0212  HGB 11.1* 9.0*  HCT 34.9* 27.7*    Assessment/Plan: Status post Cesarean section. Doing well postoperatively.  Continue current care. MMR ordered s/s rubella non-immune. Continue PNV for anemia. D/W patient female infant circumcision, risks/benefits reviewed. All questions answered.   Tyson Dense 01/10/2021, 5:26 AM

## 2021-01-11 MED ORDER — DOCUSATE SODIUM 100 MG PO CAPS: 100.0000 mg | ORAL_CAPSULE | Freq: Two times a day (BID) | ORAL | 2 refills | Status: DC

## 2021-01-11 MED ORDER — IBUPROFEN 600 MG PO TABS: 600.0000 mg | ORAL_TABLET | Freq: Four times a day (QID) | ORAL | 0 refills | Status: DC | PRN

## 2021-01-11 MED ORDER — OXYCODONE HCL 5 MG PO TABS: 5.0000 mg | ORAL_TABLET | ORAL | 0 refills | Status: DC | PRN

## 2021-01-11 NOTE — Discharge Instructions (Addendum)
Follow up with OB provider in 1 week to remove wound vac

## 2021-01-11 NOTE — Discharge Summary (Signed)
Postpartum Discharge Summary     Patient Name: Brittany Johnston DOB: 05-12-1985 MRN: [id number]  Date of admission: 01/09/2021 Delivery date:01/09/2021  Delivering provider: Tyson Dense  Date of discharge: 01/11/2021  Admitting diagnosis: Pregnancy [Z34.90] Intrauterine pregnancy: [redacted]w[redacted]d    Secondary diagnosis:  Active Problems:   Pregnancy  Additional problems: none    Discharge diagnosis: Term Pregnancy Delivered                                              Post partum procedures: none Augmentation: N/A Complications: None  Hospital course: Sceduled C/S   35y.o. yo G2P2002 at 370w0das admitted to the hospital 01/09/2021 for scheduled cesarean section with the following indication:Elective Repeat.Delivery details are as follows:  Membrane Rupture Time/Date: 8:04 AM ,01/09/2021   Delivery Method:C-Section, Low Transverse  Details of operation can be found in separate operative note.  Patient had an uncomplicated postpartum course.  She is ambulating, tolerating a regular diet, passing flatus, and urinating well. Patient is discharged home in stable condition on  01/11/21        Newborn Data: Birth date:01/09/2021  Birth time:8:10 AM  Gender:Female  Living status:Living  Apgars:9 ,9  Weight:3965 g     Magnesium Sulfate received: No BMZ received: No Rhophylac:N/A MMR:Yes T-DaP:Given prenatally Flu: N/A Transfusion:No  Physical exam  Vitals:   01/10/21 0555 01/10/21 1539 01/10/21 2155 01/11/21 0620  BP: (!) 99/53 115/61 114/62 116/69  Pulse: (!) 56 78 62 72  Resp: 20 18 17 16   Temp: 97.9 F (36.6 C) 97.9 F (36.6 C) 97.6 F (36.4 C) 97.6 F (36.4 C)  TempSrc: Oral Oral Oral Axillary  SpO2:  100% 100% 100%  Weight:      Height:       General: alert Lochia: appropriate Uterine Fundus: firm Incision: Healing well with no significant drainage, wound vac in place DVT Evaluation: No evidence of DVT seen on physical exam. Labs: Lab Results  Component Value  Date   WBC 13.0 (H) 01/10/2021   HGB 9.0 (L) 01/10/2021   HCT 27.7 (L) 01/10/2021   MCV 91.1 01/10/2021   PLT 180 01/10/2021   CMP Latest Ref Rng & Units 05/22/2019  Glucose 70 - 99 mg/dL -  BUN 6 - 20 mg/dL -  Creatinine 0.44 - 1.00 mg/dL -  Sodium 135 - 145 mmol/L 133(L)  Potassium 3.5 - 5.1 mmol/L 4.1  Chloride 98 - 111 mmol/L -  CO2 22 - 32 mmol/L -  Calcium 8.9 - 10.3 mg/dL -  Total Protein 6.5 - 8.1 g/dL -  Total Bilirubin 0.3 - 1.2 mg/dL -  Alkaline Phos 38 - 126 U/L -  AST 15 - 41 U/L -  ALT 0 - 44 U/L -   Edinburgh Score: Edinburgh Postnatal Depression Scale Screening Tool 01/11/2021  I have been able to laugh and see the funny side of things. (No Data)  I have looked forward with enjoyment to things. -  I have blamed myself unnecessarily when things went wrong. -  I have been anxious or worried for no good reason. -  I have felt scared or panicky for no good reason. -  Things have been getting on top of me. -  I have been so unhappy that I have had difficulty sleeping. -  I have felt sad or miserable. -  I have been so unhappy that I have been crying. -  The thought of harming myself has occurred to me. Flavia Shipper Postnatal Depression Scale Total -      After visit meds:  Allergies as of 01/11/2021   No Known Allergies      Medication List     TAKE these medications    acetaminophen 500 MG tablet Commonly known as: TYLENOL Take 500 mg by mouth every 6 (six) hours as needed (for pain).   calcium carbonate 500 MG chewable tablet Commonly known as: TUMS - dosed in mg elemental calcium Chew 1-2 tablets by mouth 3 (three) times daily as needed for indigestion or heartburn.   Copaxone 40 MG/ML Sosy Generic drug: Glatiramer Acetate Inject 40 mg into the skin 3 (three) times a week.   docusate sodium 100 MG capsule Commonly known as: Colace Take 1 capsule (100 mg total) by mouth 2 (two) times daily.   ibuprofen 600 MG tablet Commonly known as:  ADVIL Take 1 tablet (600 mg total) by mouth every 6 (six) hours as needed.   oxyCODONE 5 MG immediate release tablet Commonly known as: Oxy IR/ROXICODONE Take 1 tablet (5 mg total) by mouth every 4 (four) hours as needed for severe pain.   PreNatal DHA 200 MG Caps Generic drug: Docosahexaenoic Acid Take 1 capsule by mouth every evening.         Discharge home in stable condition Infant Feeding: Bottle and Breast Infant Disposition:home with mother Discharge instruction: per After Visit Summary and Postpartum booklet. Activity: Advance as tolerated. Pelvic rest for 6 weeks.  Diet: routine diet Anticipated Birth Control: Unsure Postpartum Appointment:6 weeks Additional Postpartum F/U: Incision check one week to remove wound vac . Make an appointment for Friday 9/16 Future Appointments: Future Appointments  Date Time Provider [place]  02/03/2021 12:50 PM GI-315 MR 2 [id number] [facility] [facility]  07/06/2021  8:30 AM Sater, Nanine Means, MD [facility] None   Follow up Visit:      01/11/2021 Tyson Dense, MD

## 2021-01-13 ENCOUNTER — Telehealth: Payer: Self-pay

## 2021-01-13 NOTE — Telephone Encounter (Signed)
I spoke with Dr. Epimenio Foot.  Patient delivered her baby on January 09, 2021.  She may start Tysabri.  She will not need a washout from Copaxone.  I called patient to discuss.  No answer, left a voicemail asking her to call me back.

## 2021-01-14 NOTE — Telephone Encounter (Signed)
Patient returned my call.  She is doing well after delivery.  She is agreeable to starting Tysabri.  She will continue Copaxone until she starts Tysabri.  She is not planning on breast-feeding.  Tysabri start form has been faxed to [employer].  Received receipt of confirmation.  Tysabri order and start form and clinical info have been given to our infusion suite.  Patient verbalized understanding that our infusion suite will call her to schedule her Tysabri infusions once insurance authorization has been obtained.

## 2021-01-22 ENCOUNTER — Telehealth (HOSPITAL_COMMUNITY): Payer: Self-pay | Admitting: *Deleted

## 2021-01-22 NOTE — Telephone Encounter (Signed)
Left message to return nurse call.  Duffy Rhody, RN 01-22-2021 at 2:40pm

## 2021-02-02 ENCOUNTER — Encounter (HOSPITAL_COMMUNITY): Payer: Self-pay | Admitting: Obstetrics and Gynecology

## 2021-02-02 ENCOUNTER — Inpatient Hospital Stay (HOSPITAL_COMMUNITY)
Admission: AD | Admit: 2021-02-02 | Discharge: 2021-02-02 | Disposition: A | Discharge status: Home / Self Care | Payer: No Typology Code available for payment source | Attending: Obstetrics and Gynecology | Admitting: Obstetrics and Gynecology

## 2021-02-02 ENCOUNTER — Inpatient Hospital Stay (HOSPITAL_COMMUNITY): Payer: No Typology Code available for payment source

## 2021-02-02 DIAGNOSIS — R1011 Right upper quadrant pain: Secondary | ICD-10-CM | POA: Diagnosis not present

## 2021-02-02 DIAGNOSIS — R509 Fever, unspecified: Secondary | ICD-10-CM

## 2021-02-02 DIAGNOSIS — O99893 Other specified diseases and conditions complicating puerperium: Secondary | ICD-10-CM | POA: Diagnosis not present

## 2021-02-02 LAB — COMPREHENSIVE METABOLIC PANEL
ALT: 21 U/L (ref 0–44)
AST: 20 U/L (ref 15–41)
Albumin: 3.5 g/dL (ref 3.5–5.0)
Alkaline Phosphatase: 95 U/L (ref 38–126)
Anion gap: 6 (ref 5–15)
BUN: 5 mg/dL — ABNORMAL LOW (ref 6–20)
CO2: 26 mmol/L (ref 22–32)
Calcium: 8.8 mg/dL — ABNORMAL LOW (ref 8.9–10.3)
Chloride: 102 mmol/L (ref 98–111)
Creatinine, Ser: 0.67 mg/dL (ref 0.44–1.00)
GFR, Estimated: >60 mL/min (ref >60)
Glucose, Bld: 98 mg/dL (ref 70–99)
Potassium: 3.8 mmol/L (ref 3.5–5.1)
Sodium: 134 mmol/L — ABNORMAL LOW (ref 135–145)
Total Bilirubin: 0.4 mg/dL (ref 0.3–1.2)
Total Protein: 6.5 g/dL (ref 6.5–8.1)

## 2021-02-02 LAB — CBC
HCT: 37 % (ref 36.0–46.0)
Hemoglobin: 12 g/dL (ref 12.0–15.0)
MCH: 29.1 pg (ref 26.0–34.0)
MCHC: 32.4 g/dL (ref 30.0–36.0)
MCV: 89.6 fL (ref 80.0–100.0)
Platelets: 238 10*3/uL (ref 150–400)
RBC: 4.13 MIL/uL (ref 3.87–5.11)
RDW: 12.7 % (ref 11.5–15.5)
WBC: 4.2 10*3/uL (ref 4.0–10.5)
nRBC: 0 % (ref 0.0–0.2)

## 2021-02-02 LAB — URINALYSIS, ROUTINE W REFLEX MICROSCOPIC
Bilirubin Urine: NEGATIVE
Glucose, UA: NEGATIVE mg/dL
Hgb urine dipstick: NEGATIVE
Ketones, ur: NEGATIVE mg/dL
Leukocytes,Ua: NEGATIVE
Nitrite: NEGATIVE
Protein, ur: NEGATIVE mg/dL
Specific Gravity, Urine: 1.002 — ABNORMAL LOW (ref 1.005–1.030)
pH: 7 (ref 5.0–8.0)

## 2021-02-02 LAB — WET PREP, GENITAL
Clue Cells Wet Prep HPF POC: NONE SEEN
Sperm: NONE SEEN
Trich, Wet Prep: NONE SEEN
Yeast Wet Prep HPF POC: NONE SEEN

## 2021-02-02 LAB — LIPASE, BLOOD: Lipase: 38 U/L (ref 11–51)

## 2021-02-02 MED ORDER — KETOROLAC TROMETHAMINE 30 MG/ML IJ SOLN
30.0000 mg | Freq: Once | INTRAMUSCULAR | Status: AC
  Administered 2021-02-02: 30 mg via INTRAVENOUS
  Filled 2021-02-02: qty 1

## 2021-02-02 MED ORDER — IOHEXOL 350 MG/ML SOLN
80.0000 mL | Freq: Once | INTRAVENOUS | Status: AC | PRN
  Administered 2021-02-02: 80 mL via INTRAVENOUS

## 2021-02-02 MED ORDER — ACETAMINOPHEN 500 MG PO TABS
1000.0000 mg | ORAL_TABLET | Freq: Once | ORAL | Status: AC
  Administered 2021-02-02: 1000 mg via ORAL
  Filled 2021-02-02: qty 2

## 2021-02-02 NOTE — MAU Note (Signed)
Pt reports she started having pain in her left mid/upper abd. Then chills and fever 101.7. feels cold all the time.  And body ache. Abd pain is now more on the right upper abd. Took Ibuprofen for fever last night

## 2021-02-02 NOTE — MAU Provider Note (Signed)
History   [id number]  Arrival date and time: 02/02/21 1444  Chief Complaint  Patient presents with   Fever   Abdominal Pain   HPI Brittany Johnston is a 35 y.o. [id number] s/p repeat cesarean section on 01/09/21 who presents to MAU with fever and abdominal pain. Patient states that her symptoms started yesterday after eating out. She thought maybe it was gas. It was mainly on the left upper side at that time. She started having pain again after eating dinner yesterday evening and also felt like she had a fever. Her temperature was 101.7. She took Ibuprofen for this. She continues to have intermittent sharp pain now in the right upper part of her abdomen. She denies nausea/vomiting. She reports that she is having some ongoing bleeding since her delivery but this is improving and getting lighter. It is somewhat smelly to her. She is voiding but does not feel the urge to pee like she used to before her delivery. She now just goes at scheduled times and voids a normal amount. She is not having dysuria. She denies any other abdominal surgeries other than her 2 c-sections. She denies cough/congestion. She has some pain over the right side of her incision at times but this has been improving since surgery. She has not had any problems with her incision otherwise. She is not breastfeeding. She has had looser stools for the last week but they are still formed and not watery. Due to her pain and fever, patient came to MAU for further evaluation.    --/--/O POS (09/07 0919)  OB History     Gravida  2   Para  2   Term  2   Preterm      AB      Living  2      SAB      IAB      Ectopic      Multiple  0   Live Births  2           Past Medical History:  Diagnosis Date   Multiple sclerosis (HCC)    Vaginal Pap smear, abnormal    Vision abnormalities     Past Surgical History:  Procedure Laterality Date   CESAREAN SECTION N/A 05/21/2019   Procedure: CESAREAN SECTION;  Surgeon: Ranae Pila, MD;  Location: [facility];  Service: Obstetrics;  Laterality: N/A;   CESAREAN SECTION N/A 01/09/2021   Procedure: REPEAT CESAREAN SECTION EDC: 01-16-21 ALLERG: NKDA  PREVIOUS X 1;  Surgeon: Ranae Pila, MD;  Location: [facility] ORS;  Service: Obstetrics;  Laterality: N/A;   CYSTOSCOPY N/A 05/22/2019   Procedure: CYSTOSCOPY;  Surgeon: Ranae Pila, MD;  Location: [facility]S;  Service: Gynecology;  Laterality: N/A;   LAPAROTOMY N/A 05/22/2019   Procedure: LAPAROTOMY;  Surgeon: Ranae Pila, MD;  Location: [facility];  Service: Gynecology;  Laterality: N/A;   LEEP     suppose to have follow up of cervical cells in January   MYRINGOTOMY     WISDOM TOOTH EXTRACTION      Family History  Problem Relation Age of Onset   Heart disease Mother    Healthy Father    Diabetes Maternal Grandmother    Leukemia Paternal Grandfather     Social History   Socioeconomic History   Marital status: Married    Spouse name: Not on file   Number of children: Not on file   Years of education: Not on  file   Highest education level: Not on file  Occupational History   Not on file  Tobacco Use   Smoking status: Never   Smokeless tobacco: Never  Vaping Use   Vaping Use: Never used  Substance and Sexual Activity   Alcohol use: Not Currently    Comment: occasional   Drug use: No   Sexual activity: Not Currently    Birth control/protection: None  Other Topics Concern   Not on file  Social History Narrative   Not on file   Social Determinants of Health   Financial Resource Strain: Not on file  Food Insecurity: Not on file  Transportation Needs: Not on file  Physical Activity: Not on file  Stress: Not on file  Social Connections: Not on file  Intimate Partner Violence: Not on file    No Known Allergies  No current facility-administered medications on file prior to encounter.   Current Outpatient Medications on File Prior to Encounter  Medication Sig Dispense  Refill   COPAXONE 40 MG/ML SOSY Inject 40 mg into the skin 3 (three) times a week. 36 mL 3   Docosahexaenoic Acid (PRENATAL DHA) 200 MG CAPS Take 1 capsule by mouth every evening.     acetaminophen (TYLENOL) 500 MG tablet Take 500 mg by mouth every 6 (six) hours as needed (for pain).     calcium carbonate (TUMS - DOSED IN MG ELEMENTAL CALCIUM) 500 MG chewable tablet Chew 1-2 tablets by mouth 3 (three) times daily as needed for indigestion or heartburn.     docusate sodium (COLACE) 100 MG capsule Take 1 capsule (100 mg total) by mouth 2 (two) times daily. 60 capsule 2   ibuprofen (ADVIL) 600 MG tablet Take 1 tablet (600 mg total) by mouth every 6 (six) hours as needed. 30 tablet 0   oxyCODONE (OXY IR/ROXICODONE) 5 MG immediate release tablet Take 1 tablet (5 mg total) by mouth every 4 (four) hours as needed for severe pain. 15 tablet 0     ROS Pertinent positives and negative per HPI, all others reviewed and negative  Physical Exam   BP 111/67   Pulse 83   Temp 98.5 F (36.9 C) (Oral)   Resp 16   Wt 83.5 kg   SpO2 100%   BMI 31.58 kg/m   Physical Exam Exam conducted with a chaperone present.  Constitutional:      General: She is not in acute distress.    Appearance: She is well-developed.  HENT:     Head: Normocephalic and atraumatic.     Nose: No congestion.     Mouth/Throat:     Pharynx: No oropharyngeal exudate or posterior oropharyngeal erythema.  Eyes:     General: No scleral icterus.    Extraocular Movements: Extraocular movements intact.  Cardiovascular:     Rate and Rhythm: Normal rate and regular rhythm.     Heart sounds: No murmur heard. Pulmonary:     Effort: Pulmonary effort is normal.     Breath sounds: Normal breath sounds.  Abdominal:     General: Bowel sounds are normal.     Palpations: Abdomen is soft.     Tenderness: There is abdominal tenderness in the right upper quadrant. There is no guarding or rebound. Positive signs include Murphy's sign.   Genitourinary:    General: Normal vulva.     Vagina: Normal.     Cervix: Normal.     Uterus: Not tender.      Comments: Small amount of  dark red blood in vaginal vault Lymphadenopathy:     Cervical: No cervical adenopathy.  Skin:    General: Skin is warm and dry.     Capillary Refill: Capillary refill takes less than 2 seconds.     Comments: Well-healed incision over lower abdomen, no surrounding erythema or drainage noted.  Neurological:     General: No focal deficit present.     Mental Status: She is alert and oriented to person, place, and time.  Psychiatric:        Mood and Affect: Mood normal.        Behavior: Behavior normal.   Labs Results for orders placed or performed during the hospital encounter of 02/02/21 (from the past 24 hour(s))  Urinalysis, Routine w reflex microscopic Urine, Clean Catch     Status: Abnormal   Collection Time: 02/02/21  3:04 PM  Result Value Ref Range   Color, Urine STRAW (A) YELLOW   APPearance CLEAR CLEAR   Specific Gravity, Urine 1.002 (L) 1.005 - 1.030   pH 7.0 5.0 - 8.0   Glucose, UA NEGATIVE NEGATIVE mg/dL   Hgb urine dipstick NEGATIVE NEGATIVE   Bilirubin Urine NEGATIVE NEGATIVE   Ketones, ur NEGATIVE NEGATIVE mg/dL   Protein, ur NEGATIVE NEGATIVE mg/dL   Nitrite NEGATIVE NEGATIVE   Leukocytes,Ua NEGATIVE NEGATIVE  CBC     Status: None   Collection Time: 02/02/21  4:21 PM  Result Value Ref Range   WBC 4.2 4.0 - 10.5 K/uL   RBC 4.13 3.87 - 5.11 MIL/uL   Hemoglobin 12.0 12.0 - 15.0 g/dL   HCT 35.7 01.7 - 79.3 %   MCV 89.6 80.0 - 100.0 fL   MCH 29.1 26.0 - 34.0 pg   MCHC 32.4 30.0 - 36.0 g/dL   RDW 90.3 00.9 - 23.3 %   Platelets 238 150 - 400 K/uL   nRBC 0.0 0.0 - 0.2 %  Comprehensive metabolic panel     Status: Abnormal   Collection Time: 02/02/21  4:21 PM  Result Value Ref Range   Sodium 134 (L) 135 - 145 mmol/L   Potassium 3.8 3.5 - 5.1 mmol/L   Chloride 102 98 - 111 mmol/L   CO2 26 22 - 32 mmol/L   Glucose, Bld 98 70 -  99 mg/dL   BUN 5 (L) 6 - 20 mg/dL   Creatinine, Ser 0.07 0.44 - 1.00 mg/dL   Calcium 8.8 (L) 8.9 - 10.3 mg/dL   Total Protein 6.5 6.5 - 8.1 g/dL   Albumin 3.5 3.5 - 5.0 g/dL   AST 20 15 - 41 U/L   ALT 21 0 - 44 U/L   Alkaline Phosphatase 95 38 - 126 U/L   Total Bilirubin 0.4 0.3 - 1.2 mg/dL   GFR, Estimated >62 >26 mL/min   Anion gap 6 5 - 15  Lipase, blood     Status: None   Collection Time: 02/02/21  4:21 PM  Result Value Ref Range   Lipase 38 11 - 51 U/L  Wet prep, genital     Status: Abnormal   Collection Time: 02/02/21  5:49 PM   Specimen: Cervix  Result Value Ref Range   Yeast Wet Prep HPF POC NONE SEEN NONE SEEN   Trich, Wet Prep NONE SEEN NONE SEEN   Clue Cells Wet Prep HPF POC NONE SEEN NONE SEEN   WBC, Wet Prep HPF POC MANY (A) NONE SEEN   Sperm NONE SEEN     Imaging CT ABDOMEN PELVIS  W CONTRAST  Result Date: 02/02/2021 CLINICAL DATA:  Abdominal pain, history of recent cesarean section 1 month ago, initial encounter EXAM: CT ABDOMEN AND PELVIS WITH CONTRAST TECHNIQUE: Multidetector CT imaging of the abdomen and pelvis was performed using the standard protocol following bolus administration of intravenous contrast. CONTRAST:  54mL OMNIPAQUE IOHEXOL 350 MG/ML SOLN COMPARISON:  Ultrasound from earlier in the same day. FINDINGS: Lower chest: No acute abnormality. Liver is fatty infiltrated. There are hypodensities identified scattered throughout the liver. The largest of these is noted centrally and corresponds to the hyperechoic focus seen on recent ultrasound examination. This likely represents a hemangioma although not clarified on this exam. Gallbladder is decompressed. The known cholelithiasis is not appreciated on this study. Hepatobiliary: No focal liver abnormality is seen. No gallstones, gallbladder wall thickening, or biliary dilatation. Pancreas: Unremarkable. No pancreatic ductal dilatation or surrounding inflammatory changes. Spleen: Normal in size without focal  abnormality. Adrenals/Urinary Tract: Adrenal glands are within normal limits. Kidneys demonstrate a normal enhancement pattern bilaterally. Multiple renal calculi are noted on the left the largest of which measures approximately 4 mm. No obstructive changes are noted on the left. Mild fullness of the right renal collecting system is noted. Right ureter appears within normal limits. No obstructive changes are seen. Bladder is well distended. Stomach/Bowel: Appendix is well visualized and partially air-filled. No inflammatory changes to suggest appendicitis are noted. Stomach is unremarkable. Small bowel and colon are within normal limits. Vascular/Lymphatic: No significant vascular findings are present. No enlarged abdominal or pelvic lymph nodes. Reproductive: Changes consistent with recent cesarean section are noted. Mild inflammatory change along the surgical scar is noted although no abscess is seen. Fluid is noted within the endometrial canal of uncertain significance. No air is identified to suggest endometritis. Adnexa appear within normal limits. Other: No abdominal wall hernia or abnormality. No abdominopelvic ascites. Musculoskeletal: No acute or significant osseous findings. IMPRESSION: Changes consistent with recent cesarean section. Mild inflammatory changes are noted along the surgical scar although no abscess is seen. Fluid is noted within the endometrial canal of uncertain significance no findings to suggest focal abscess are identified. Nonobstructing left renal calculi. Fatty liver. Hypodensities are noted within the liver which correspond to that seen on recent ultrasound and likely represent hemangiomas but incompletely evaluated on this exam. Again MRI is recommended on a nonemergent basis for further characterization. Known cholelithiasis is not appreciated on this study. Electronically Signed   By: Alcide Clever M.D.   On: 02/02/2021 20:34   US Abdomen Limited RUQ (LIVER/GB)  Result Date:  02/02/2021 CLINICAL DATA:  Quadrant pain EXAM: ULTRASOUND ABDOMEN LIMITED RIGHT UPPER QUADRANT COMPARISON:  Abdominal ultrasound 08/08/2007, CT abdomen/pelvis 08/07/2019 FINDINGS: Gallbladder: Calcified gallstones are seen measuring up to 7 mm. The gallbladder is contracted. There is no gallbladder wall thickening or pericholecystic fluid. No positive sonographic Murphy's sign was reported by the sonographer. Common bile duct: Diameter: 2 mm Liver: There is a focal hyperechoic area in the right hepatic lobe measuring 2.4 cm x 2.2 cm x 2.0 cm. Parenchymal echogenicity is otherwise normal. Portal vein is patent on color Doppler imaging with normal direction of blood flow towards the liver. Other: None. IMPRESSION: 1. Cholelithiasis without evidence of acute cholecystitis. 2. Hyperechoic lesion in the liver measuring up to 2.4 cm may reflect a hemangioma. Recommend MRI of the abdomen with and without contrast for definitive characterization. Electronically Signed   By: Lesia Hausen M.D.   On: 02/02/2021 17:00    MAU Course  Procedures  Lab Orders         Wet prep, genital         Urinalysis, Routine w reflex microscopic Urine, Clean Catch         CBC         Comprehensive metabolic panel         Lipase, blood    Meds ordered this encounter  Medications   acetaminophen (TYLENOL) tablet 1,000 mg   ketorolac (TORADOL) 30 MG/ML injection 30 mg   iohexol (OMNIPAQUE) 350 MG/ML injection 80 mL   Imaging Orders         US Abdomen Limited RUQ (LIVER/GB)         CT ABDOMEN PELVIS W CONTRAST      MDM: 2 days of upper abdominal pain; mainly right sided Intermittent/sharp in nature with fever VSS upon arrival  + Murphy's sign on exam; concern for hepatobiliary pathology CBC, CMP, urinalysis, and RUQ [place] ordered.  Tylenol 1000 mg given.  CBC, CMP and UA unremarkable.  RUQ [place] with cholelithiasis without cholecystitis  Incidental hyperechoic liver lesion noted; possibly hemangioma (follow up MRI  recommended) Pelvic exam with small amount of dark blood in vaginal vault. Wet prep negative; GC/CT pending.  IV Toradol given for persistent headache despite Tylenol. CT abdomen/pelvis with contrast ordered given fever/abdominal pain without identified source on [place]. CT scan showed no acute abdominal findings to correlate with her symptoms. Upon reassessment, patient feels much improved. No longer reporting fever/chills. Abdominal pain resolved currently.   Assessment and Plan   1. Abdominal pain, acute, right upper quadrant Etiology of symptoms not entirely clear. Afebrile in MAU and VSS. Tolerating PO. Exam within normal limits. Workup thus far reassuring. No signs of post-operative infection or other acute abdominal process. Discussed that patient could have a viral illness that is contributing to the fever she had. RUQ pain does sound like biliary colic. Also discussed possibility of gastric/duodenal ulcer. Will refer to GI for further outpatient work up of her pain. Strict return precautions reviewed should her symptoms worsen or fail to improve. Patient voiced understanding and is agreeable to plan. Recommended that patient go to main ED if her symptoms worsen, given that obstetric/gynecologic etiologies have been addressed and ruled out at this time.  - Ambulatory referral to Gastroenterology  Evalina Field, MD OB Fellow, Faculty Practice [facility], [facility] for [facility] 02/03/2021 12:07 AM

## 2021-02-03 ENCOUNTER — Ambulatory Visit
Admission: RE | Admit: 2021-02-03 | Discharge: 2021-02-03 | Disposition: A | Discharge status: Home / Self Care | Payer: No Typology Code available for payment source | Source: Ambulatory Visit | Attending: Neurology | Admitting: Neurology

## 2021-02-03 DIAGNOSIS — G35 Multiple sclerosis: Secondary | ICD-10-CM

## 2021-02-03 DIAGNOSIS — R200 Anesthesia of skin: Secondary | ICD-10-CM

## 2021-02-03 LAB — CERVICOVAGINAL ANCILLARY ONLY
Chlamydia: NEGATIVE
Comment: NEGATIVE
Comment: NORMAL
Neisseria Gonorrhea: NEGATIVE

## 2021-02-03 MED ORDER — GADOBENATE DIMEGLUMINE 529 MG/ML IV SOLN
18.0000 mL | Freq: Once | INTRAVENOUS | Status: AC | PRN
  Administered 2021-02-03: 18 mL via INTRAVENOUS

## 2021-02-13 ENCOUNTER — Encounter: Payer: Self-pay | Admitting: Gastroenterology

## 2021-02-13 ENCOUNTER — Ambulatory Visit (INDEPENDENT_AMBULATORY_CARE_PROVIDER_SITE_OTHER): Payer: No Typology Code available for payment source | Admitting: Gastroenterology

## 2021-02-13 ENCOUNTER — Other Ambulatory Visit (INDEPENDENT_AMBULATORY_CARE_PROVIDER_SITE_OTHER): Payer: No Typology Code available for payment source

## 2021-02-13 VITALS — BP 100/60 | HR 66 | Ht 64.0 in | Wt 181.0 lb

## 2021-02-13 DIAGNOSIS — K602 Anal fissure, unspecified: Secondary | ICD-10-CM

## 2021-02-13 DIAGNOSIS — R1010 Upper abdominal pain, unspecified: Secondary | ICD-10-CM

## 2021-02-13 DIAGNOSIS — R194 Change in bowel habit: Secondary | ICD-10-CM | POA: Diagnosis not present

## 2021-02-13 DIAGNOSIS — K5909 Other constipation: Secondary | ICD-10-CM | POA: Diagnosis not present

## 2021-02-13 DIAGNOSIS — R140 Abdominal distension (gaseous): Secondary | ICD-10-CM

## 2021-02-13 DIAGNOSIS — K6289 Other specified diseases of anus and rectum: Secondary | ICD-10-CM | POA: Diagnosis not present

## 2021-02-13 DIAGNOSIS — K769 Liver disease, unspecified: Secondary | ICD-10-CM

## 2021-02-13 DIAGNOSIS — K8020 Calculus of gallbladder without cholecystitis without obstruction: Secondary | ICD-10-CM

## 2021-02-13 DIAGNOSIS — K648 Other hemorrhoids: Secondary | ICD-10-CM

## 2021-02-13 DIAGNOSIS — K625 Hemorrhage of anus and rectum: Secondary | ICD-10-CM

## 2021-02-13 DIAGNOSIS — K649 Unspecified hemorrhoids: Secondary | ICD-10-CM

## 2021-02-13 LAB — SEDIMENTATION RATE: Sed Rate: 15 mm/hr (ref 0–20)

## 2021-02-13 LAB — C-REACTIVE PROTEIN: CRP: <1 mg/dL (ref 0.5–20.0)

## 2021-02-13 LAB — CORTISOL: Cortisol, Plasma: 3.3 ug/dL

## 2021-02-13 LAB — CBC
HCT: 36.5 % (ref 36.0–46.0)
Hemoglobin: 11.9 g/dL — ABNORMAL LOW (ref 12.0–15.0)
MCHC: 32.4 g/dL (ref 30.0–36.0)
MCV: 87.4 fl (ref 78.0–100.0)
Platelets: 255 10*3/uL (ref 150.0–400.0)
RBC: 4.18 Mil/uL (ref 3.87–5.11)
RDW: 14.1 % (ref 11.5–15.5)
WBC: 6.4 10*3/uL (ref 4.0–10.5)

## 2021-02-13 LAB — LIPASE: Lipase: 51 U/L (ref 11.0–59.0)

## 2021-02-13 LAB — TSH: TSH: 2.04 u[IU]/mL (ref 0.35–5.50)

## 2021-02-13 MED ORDER — ESOMEPRAZOLE MAGNESIUM 40 MG PO CPDR: 40.0000 mg | DELAYED_RELEASE_CAPSULE | Freq: Every day | ORAL | 2 refills | Status: DC

## 2021-02-13 NOTE — Patient Instructions (Addendum)
Trial of Linzess - Take 1 capsule by mouth once daily. If this works please contact and we will send prescription to pharmacy.   Use Recticare rectally - four times daily or after having bowel movements. Samples given today. (Recti care maybe purchased over the counter. )  Please send my chart message if you continue to have rectal bleeding. At that time Dr Meridee Score will consider sending in prescription for NTG ointment of Diltiazem Gel.   You will need a MRI abdomen in 3 months. You will be contacted by [facility] Scheduling to schedule this.   Your provider has requested that you go to the basement level for lab work before leaving today. Press "B" on the elevator. The lab is located at the first door on the left as you exit the elevator.  We have sent the following medications to your pharmacy for you to pick up at your convenience: Nexium ( start nexium after you have completed stool studies.)   If you are age 108 or younger, your body mass index should be between 19-25. Your Body mass index is 31.07 kg/m. If this is out of the aformentioned range listed, please consider follow up with your Primary Care Provider.   __________________________________________________________  The [facility] providers would like to encourage you to use [facility] to communicate with providers for non-urgent requests or questions.  Due to long hold times on the telephone, sending your provider a message by [facility] may be a faster and more efficient way to get a response.  Please allow 48 business hours for a response.  Please remember that this is for non-urgent requests.   Thank you for choosing me and [facility].  Dr. Meridee Score

## 2021-02-14 ENCOUNTER — Encounter: Payer: Self-pay | Admitting: Gastroenterology

## 2021-02-14 DIAGNOSIS — K649 Unspecified hemorrhoids: Secondary | ICD-10-CM | POA: Insufficient documentation

## 2021-02-14 DIAGNOSIS — K648 Other hemorrhoids: Secondary | ICD-10-CM | POA: Insufficient documentation

## 2021-02-14 DIAGNOSIS — R140 Abdominal distension (gaseous): Secondary | ICD-10-CM | POA: Insufficient documentation

## 2021-02-14 DIAGNOSIS — K5909 Other constipation: Secondary | ICD-10-CM | POA: Insufficient documentation

## 2021-02-14 DIAGNOSIS — K8020 Calculus of gallbladder without cholecystitis without obstruction: Secondary | ICD-10-CM | POA: Insufficient documentation

## 2021-02-14 DIAGNOSIS — K6289 Other specified diseases of anus and rectum: Secondary | ICD-10-CM | POA: Insufficient documentation

## 2021-02-14 DIAGNOSIS — K602 Anal fissure, unspecified: Secondary | ICD-10-CM | POA: Insufficient documentation

## 2021-02-14 DIAGNOSIS — K769 Liver disease, unspecified: Secondary | ICD-10-CM | POA: Insufficient documentation

## 2021-02-14 DIAGNOSIS — K625 Hemorrhage of anus and rectum: Secondary | ICD-10-CM | POA: Insufficient documentation

## 2021-02-14 DIAGNOSIS — R194 Change in bowel habit: Secondary | ICD-10-CM | POA: Insufficient documentation

## 2021-02-14 DIAGNOSIS — R1010 Upper abdominal pain, unspecified: Secondary | ICD-10-CM | POA: Insufficient documentation

## 2021-02-14 HISTORY — DX: Calculus of gallbladder without cholecystitis without obstruction: K80.20

## 2021-02-14 HISTORY — DX: Unspecified hemorrhoids: K64.9

## 2021-02-14 NOTE — Progress Notes (Signed)
GASTROENTEROLOGY OUTPATIENT CLINIC VISIT   Primary Care Provider Patient, No Pcp Per (Inactive) No address on file None  Referring Provider Worthy Rancher, MD [address] [place],  [place] [address] [phone]  Patient Profile: Brittany Johnston is a 35 y.o. female with a pmh significant for MS, status post recent C-section for second child, anxiety, hemorrhoids, chronic recurrent abdominal pain NOS, chronic constipation with alternating diarrhea.  The patient presents to the [facility] for an evaluation and management of problem(s) noted below:  Problem List 1. Change in bowel habits   2. Chronic constipation   3. Rectal pain   4. Anal fissure   5. Rectal bleeding   6. Upper abdominal pain   7. Other hemorrhoids   8. Bloating symptom   9. Hemorrhoids, unspecified hemorrhoid type   10. Calculus of gallbladder without cholecystitis without obstruction   11. Liver lesion     History of Present Illness This is the patient's first visit to the outpatient [facility].  She just had her second child via C-section within the last month and a half.  The patient describes a longstanding history of recurrent abdominal pain, alternating constipation/diarrhea, fevers, hemorrhoids, rectal/anal pain, and abnormal CT findings on the liver.  It is for all of these reasons that the patient is evaluated in the GI clinic today.  The patient states that she has a right upper quadrant and epigastric abdominal pain that comes and goes.  This can be postprandial but also occur without eating while fasting.  She does have more postprandial abdominal bloating that lasts and then goes away.  She has not used Gas-X or Beano in the past.  The patient states that when she was approximately 35 years of age she underwent a colon endoscopy at [facility] and approximately 10 years ago had an endoscopy performed elsewhere.  These were done as result of trying to figure out her more chronic  symptoms that she has had.  She cannot recall the findings but was told she could have IBS.  The patient states that her stool alternation is really more of a constipation.  She has been irregular for years.  She may not have a bowel movement for 3 to 4 days.  When she finally has a bowel movement it is initially formed and then becomes diarrhea and water like.  The patient has used MiraLAX Epson salts to try and help with her constipation.  She has also used suppositories for hemorrhoids.  She is only used these therapies for her constipation and hemorrhoids (she has never been on Linzess/Amitiza/Trulance/Motegrity).  She has noted some rectal bleeding as well.  More recently the patient has been experiencing rectal pain at the time of trying to have stool passage but most significantly as stool passes through the anal canal.  She has never been diagnosed with a fissure that she is aware of.  The patient does experience pyrosis symptoms at times but has never been on a true PPI therapy for an extended period in time.  The patient was with multiple sclerosis 3 to 3-1/2 years ago but has been doing well overall.  Her family history is significant for colon polyps in her father but no GI malignancies otherwise.  GI Review of Systems Positive as above Negative for dysphagia, odynophagia, nausea, vomiting, melena  Review of Systems General: Denies fevers/chills/weight loss unintentionally (has lost weight after her childbirth) HEENT: Denies oral lesions Cardiovascular: Denies chest pain Pulmonary: Denies shortness of  breath Gastroenterological: See HPI Genitourinary: Denies darkened urine Hematological: Positive for history of prior easy bruising/bleeding Endocrine: Denies temperature intolerance Dermatological: Denies Psychological: Mood is stable   Medications Current Outpatient Medications  Medication Sig Dispense Refill   esomeprazole (NEXIUM) 40 MG capsule Take 1 capsule (40 mg total) by mouth  daily at 12 noon. 30 capsule 2   Glatiramer Acetate (COPAXONE) 40 MG/ML SOSY Inject into the skin. Three times a week     No current facility-administered medications for this visit.    Allergies No Known Allergies  Histories Past Medical History:  Diagnosis Date   Multiple sclerosis (HCC)    Vaginal Pap smear, abnormal    Vision abnormalities    Past Surgical History:  Procedure Laterality Date   CESAREAN SECTION N/A 05/21/2019   Procedure: CESAREAN SECTION;  Surgeon: Ranae Pila, MD;  Location: [facility] LD ORS;  Service: Obstetrics;  Laterality: N/A;   CESAREAN SECTION N/A 01/09/2021   Procedure: REPEAT CESAREAN SECTION EDC: 01-16-21 ALLERG: NKDA  PREVIOUS X 1;  Surgeon: Ranae Pila, MD;  Location: [facility] LD ORS;  Service: Obstetrics;  Laterality: N/A;   CYSTOSCOPY N/A 05/22/2019   Procedure: CYSTOSCOPY;  Surgeon: Ranae Pila, MD;  Location: [facility] LD ORS;  Service: Gynecology;  Laterality: N/A;   LAPAROTOMY N/A 05/22/2019   Procedure: LAPAROTOMY;  Surgeon: Ranae Pila, MD;  Location: [facility] LD ORS;  Service: Gynecology;  Laterality: N/A;   LEEP     suppose to have follow up of cervical cells in January   MYRINGOTOMY     WISDOM TOOTH EXTRACTION     Social History   Socioeconomic History   Marital status: Married    Spouse name: Not on file   Number of children: Not on file   Years of education: Not on file   Highest education level: Not on file  Occupational History   Not on file  Tobacco Use   Smoking status: Never   Smokeless tobacco: Never  Vaping Use   Vaping Use: Never used  Substance and Sexual Activity   Alcohol use: Not Currently    Comment: occasional   Drug use: No   Sexual activity: Not Currently    Birth control/protection: None  Other Topics Concern   Not on file  Social History Narrative   Not on file   Social Determinants of Health   Financial Resource Strain: Not on file  Food Insecurity: Not on file  Transportation Needs:  Not on file  Physical Activity: Not on file  Stress: Not on file  Social Connections: Not on file  Intimate Partner Violence: Not on file   Family History  Problem Relation Age of Onset   Heart disease Mother    Colon polyps Father    Healthy Father    Diabetes Maternal Grandmother    Leukemia Paternal Grandfather    Colon cancer Neg Hx    Esophageal cancer Neg Hx    Inflammatory bowel disease Neg Hx    Liver disease Neg Hx    Pancreatic cancer Neg Hx    Rectal cancer Neg Hx    I have reviewed her medical, social, and family history in detail and updated the electronic medical record as necessary.    PHYSICAL EXAMINATION  BP 100/60   Pulse 66   Ht 5\' 4"  (1.626 m)   Wt 181 lb (82.1 kg)   SpO2 99%   BMI 31.07 kg/m  Wt Readings from Last 3 Encounters:  02/13/21 181  lb (82.1 kg)  02/02/21 184 lb (83.5 kg)  01/09/21 200 lb 11.2 oz (91 kg)  GEN: NAD, appears stated age, doesn't appear chronically ill PSYCH: Cooperative, without pressured speech EYE: Conjunctivae pink, sclerae anicteric ENT: MMM CV: RR without R/Gs  RESP: CTAB posteriorly, without wheezing GI: NABS, soft, minimal tenderness to palpation throughout abdomen, nondistended, without rebound or guarding, no HSM appreciated GU: Perineal exam shows evidence of external hemorrhoidal skin tags, digital rectal exam shows evidence of likely internal hemorrhoids and normal anorectal tone, anoscopy shows evidence of internal hemorrhoids and a fissure at the right lateral position MSK/EXT: Trace bilateral pedal edema SKIN: No jaundice NEURO:  Alert & Oriented x 3, no focal deficits   REVIEW OF DATA  I reviewed the following data at the time of this encounter:  GI Procedures and Studies  We do not have access to the following: Previous EGD/colonoscopy at [facility] at age 62  Repeat endoscopy elsewhere approximately 10 years ago  Laboratory Studies  Reviewed those in epic and care everywhere  Imaging Studies   October 2022 abdominal ultrasound right upper quadrant IMPRESSION: 1. Cholelithiasis without evidence of acute cholecystitis. 2. Hyperechoic lesion in the liver measuring up to 2.4 cm may reflect a hemangioma. Recommend MRI of the abdomen with and without contrast for definitive characterization.  September 2022 CT abdomen pelvis IMPRESSION: Changes consistent with recent cesarean section. Mild inflammatory changes are noted along the surgical scar although no abscess is seen. Fluid is noted within the endometrial canal of uncertain significance no findings to suggest focal abscess are identified. Nonobstructing left renal calculi. Fatty liver. Hypodensities are noted within the liver which correspond to that seen on recent ultrasound and likely represent hemangiomas but incompletely evaluated on this exam. Again MRI is recommended on a nonemergent basis for further characterization. Known cholelithiasis is not appreciated on this study.   ASSESSMENT  Ms. Scobey is a 35 y.o. female with a pmh significant for MS, status post recent C-section for second child, anxiety, hemorrhoids, chronic constipation with alternating diarrhea.  The patient is seen today for evaluation and management of:  1. Change in bowel habits   2. Chronic constipation   3. Rectal pain   4. Anal fissure   5. Rectal bleeding   6. Upper abdominal pain   7. Other hemorrhoids   8. Bloating symptom   9. Hemorrhoids, unspecified hemorrhoid type   10. Calculus of gallbladder without cholecystitis without obstruction   11. Liver lesion    The patient is hemodynamically stable.  Clinically, the patient has had multiple issues over the course of the last few weeks but really more lifelong.  In regards to her rectal pain and discomfort in the setting of her chronic constipation I believe she likely has IBS C versus IBS-C/D.  We will going to try to optimize her bowel habits by getting her on MiraLAX more regularly and  initiate Linzess samples to see if we can get her to move her bowels.  If this helps then we will continue Linzess therapy or consider another laxative therapy for her.  She does have evidence of what seems to be a rectal fissure and so we will initiate therapy with RectiCare but if the rectal pain persist we will consider nitroglycerin or diltiazem ointment and she will let us know how things are in the course of the next few weeks.  Hopefully if we can get the bowels moving and she is having less constipation and less straining then  things will be easier to pass and will have less issues overall.  In regards to her right upper quadrant epigastric abdominal discomfort it seems like this has been a longer standing issue that is coming gone at times but she definitely has evidence of cholelithiasis on her right upper quadrant ultrasound.  It is possible that her recent pregnancy issues could occur as a result of increasing cholelithiasis.  If she continues to have significant right upper quadrant abdominal discomfort that is postprandial in nature we will need to consider the role of a potential cholecystectomy evaluation.  Endoscopic evaluation from above and below as a result of her longer standing alteration of bowel habits or rectal pain and bleeding will need to be considered as well.  We will begin with the diagnostic work-up as outlined below all patient questions were answered to the best of my ability, and the patient agrees to the aforementioned plan of action with follow-up as indicated.   PLAN  Laboratories as outlined below Fecal elastase testing to be obtained H. pylori stool antigen testing to be obtained Initiate Nexium 40 mg daily RectiCare 4 times daily and after bowel movement If patient still having issues within 3 weeks of the use of RectiCare then will transition to nitroglycerin or diltiazem ointment Linzess 145 mcg samples doing well will send prescription for change medication   Continue MiraLAX daily for now until we see how Linzess does MRI liver in 3 to 4 months If right upper quadrant abdominal discomfort persists will need to consider potential surgical evaluation even though symptoms are not most consistent with true biliary colic she does have known cholelithiasis this   Orders Placed This Encounter  Procedures   Helicobacter pylori special antigen   CBC   Calcium, ionized   Lipase   TSH   Cortisol   Sedimentation rate   C-reactive protein   Tissue transglutaminase, IgA   Pancreatic elastase, fecal   IgA    New Prescriptions   ESOMEPRAZOLE (NEXIUM) 40 MG CAPSULE    Take 1 capsule (40 mg total) by mouth daily at 12 noon.   Modified Medications   No medications on file    Planned Follow Up No follow-ups on file.   Total Time in Face-to-Face and in Coordination of Care for patient including independent/personal interpretation/review of prior testing, medical history, examination, medication adjustment, communicating results with the patient directly, and documentation within the EHR is 45 minutes.  Corliss Parish, MD [facility] Office # [phone]

## 2021-02-16 ENCOUNTER — Other Ambulatory Visit: Payer: Self-pay

## 2021-02-16 DIAGNOSIS — R7989 Other specified abnormal findings of blood chemistry: Secondary | ICD-10-CM

## 2021-02-16 LAB — CALCIUM, IONIZED: Calcium, Ion: 4.89 mg/dL (ref 4.8–5.6)

## 2021-02-16 LAB — TISSUE TRANSGLUTAMINASE, IGA: (tTG) Ab, IgA: <1 U/mL

## 2021-02-16 LAB — IGA: Immunoglobulin A: 206 mg/dL (ref 47–310)

## 2021-02-20 ENCOUNTER — Other Ambulatory Visit: Payer: No Typology Code available for payment source

## 2021-02-20 DIAGNOSIS — K5909 Other constipation: Secondary | ICD-10-CM

## 2021-02-20 DIAGNOSIS — K625 Hemorrhage of anus and rectum: Secondary | ICD-10-CM

## 2021-02-20 DIAGNOSIS — R194 Change in bowel habit: Secondary | ICD-10-CM

## 2021-02-20 DIAGNOSIS — R140 Abdominal distension (gaseous): Secondary | ICD-10-CM

## 2021-02-20 DIAGNOSIS — K649 Unspecified hemorrhoids: Secondary | ICD-10-CM

## 2021-02-20 DIAGNOSIS — K769 Liver disease, unspecified: Secondary | ICD-10-CM

## 2021-02-20 DIAGNOSIS — K6289 Other specified diseases of anus and rectum: Secondary | ICD-10-CM

## 2021-02-24 NOTE — Telephone Encounter (Signed)
Patient resumed tysabri on 02/18/21.

## 2021-02-28 LAB — HELICOBACTER PYLORI  SPECIAL ANTIGEN
MICRO NUMBER:: 12535042
SPECIMEN QUALITY: ADEQUATE

## 2021-02-28 LAB — PANCREATIC ELASTASE, FECAL: Pancreatic Elastase-1, Stool: >500 mcg/g

## 2021-03-02 ENCOUNTER — Other Ambulatory Visit (INDEPENDENT_AMBULATORY_CARE_PROVIDER_SITE_OTHER): Payer: No Typology Code available for payment source

## 2021-03-02 DIAGNOSIS — R7989 Other specified abnormal findings of blood chemistry: Secondary | ICD-10-CM

## 2021-03-02 LAB — CORTISOL: Cortisol, Plasma: 9 ug/dL

## 2021-03-05 ENCOUNTER — Encounter (HOSPITAL_COMMUNITY): Payer: Self-pay | Admitting: Radiology

## 2021-06-22 ENCOUNTER — Other Ambulatory Visit (INDEPENDENT_AMBULATORY_CARE_PROVIDER_SITE_OTHER): Payer: Self-pay

## 2021-06-22 ENCOUNTER — Other Ambulatory Visit: Payer: Self-pay

## 2021-06-22 DIAGNOSIS — Z79899 Other long term (current) drug therapy: Secondary | ICD-10-CM

## 2021-06-22 DIAGNOSIS — G35 Multiple sclerosis: Secondary | ICD-10-CM

## 2021-06-22 DIAGNOSIS — Z0289 Encounter for other administrative examinations: Secondary | ICD-10-CM

## 2021-06-23 LAB — CBC WITH DIFFERENTIAL
Basophils Absolute: 0 10*3/uL (ref 0.0–0.2)
Basos: 1 %
EOS (ABSOLUTE): 0.1 10*3/uL (ref 0.0–0.4)
Eos: 2 %
Hematocrit: 38.2 % (ref 34.0–46.6)
Hemoglobin: 12.7 g/dL (ref 11.1–15.9)
Immature Grans (Abs): 0 10*3/uL (ref 0.0–0.1)
Immature Granulocytes: 0 %
Lymphocytes Absolute: 3.6 10*3/uL — ABNORMAL HIGH (ref 0.7–3.1)
Lymphs: 59 %
MCH: 27.3 pg (ref 26.6–33.0)
MCHC: 33.2 g/dL (ref 31.5–35.7)
MCV: 82 fL (ref 79–97)
Monocytes Absolute: 0.4 10*3/uL (ref 0.1–0.9)
Monocytes: 6 %
Neutrophils Absolute: 1.9 10*3/uL (ref 1.4–7.0)
Neutrophils: 32 %
RBC: 4.65 x10E6/uL (ref 3.77–5.28)
RDW: 13.1 % (ref 11.7–15.4)
WBC: 6.1 10*3/uL (ref 3.4–10.8)

## 2021-07-01 LAB — STRATIFY JCV(TM) AB W/INDEX
JCV Antibody: NEGATIVE
JCV Index Value: 0.12

## 2021-07-06 ENCOUNTER — Ambulatory Visit: Payer: No Typology Code available for payment source | Admitting: Neurology

## 2021-07-06 ENCOUNTER — Encounter: Payer: Self-pay | Admitting: Neurology

## 2021-07-06 VITALS — BP 113/71 | HR 73 | Ht 64.0 in | Wt 165.0 lb

## 2021-07-06 DIAGNOSIS — R5383 Other fatigue: Secondary | ICD-10-CM | POA: Diagnosis not present

## 2021-07-06 DIAGNOSIS — F09 Unspecified mental disorder due to known physiological condition: Secondary | ICD-10-CM | POA: Diagnosis not present

## 2021-07-06 DIAGNOSIS — G35 Multiple sclerosis: Secondary | ICD-10-CM | POA: Insufficient documentation

## 2021-07-06 DIAGNOSIS — Z79899 Other long term (current) drug therapy: Secondary | ICD-10-CM

## 2021-07-06 MED ORDER — PHENTERMINE HCL 37.5 MG PO CAPS: 37.5000 mg | ORAL_CAPSULE | ORAL | 5 refills | Status: DC

## 2021-07-06 NOTE — Progress Notes (Addendum)
[facility]  PATIENT: Brittany Johnston DOB: 04-24-1986  REFERRING DOCTOR OR PCP:  Aura Camps [facility])    Fax [phone]  _________________________________   HISTORICAL  CHIEF COMPLAINT:  Chief Complaint  Patient presents with   Follow-up    Rm 1, alone. Here for 6 month MS f/u, on Tysabri and tolerating well. Last infusion: 06/22/2021 and Next infusion: 07/20/2021. Pt reports being sleepy, has a 30 month old at home. Overall doing well today.     HISTORY OF PRESENT ILLNESS:  Aalaya Yadao is a 36 y.o. woman with relapsing remitting MS on Tysabri.   Update 07/06/2021: She delivered in September (son, [name]).  She had a c-section.Also has daughter Renea Ee, 05/2019).   She may want a third child.    She is JCV antibody negative She tolerated Tysabri very well and had no exacerbations while on it.     She feels her MS is stable.  She is on Tysabri and tolerates it well.       Last JCV was negative 06/22/2021 at 0.12.    She denies difficulties with gait or balance and can go up and down stairs easily.    She denies issues with numbness or tingling.   Bladder function is fine.    Vision is fine.       She feels forgetful and needs to use reminders on her phone.   Focus is reduced   She notes some fatigue. She is sleeping well.   She denies depression.  Cognition is doing well.       She is trying to lose weightand is eating better.        MS History   Around 05/20/2016, she had the onset of left eye pain, worse with movement.   Symptoms started while driving and she noted looking to merge was painful. She saw her ophthalmologist and she had an MRI 06/03/2016 showing optic nerve enhancement on the left and a second focus in the right hemisphere that also enhanced.     She noted mild blurry vision shortly after that but it seemed worse 06/05/2016 and the next day, vision was worse.    She went to the [facility] ED and was admitted for 5 days of IV Solu-Medrol (3 days in  hospital, 2 as outpatient).  There, she had an MRI of the cervical spine that showed 2 more lesions (at C4C5 and T1) consistent with MS.  In retrospect, a few weeks before the visual symptoms, she noted right hand numbness, especially if she hold her phone which is persisting.     I have reviewed the MRI of the brain dated 06/03/2016 and the MRI of the cervical spine dated 06/08/2016.   The MRI of the brain showed enhancement of the left optic nerve. Additionally in the right temporal lobes there is another T2/FLAIR hyperintense focus that enhances. Additionally there is a periventricular right cerebellar focus that does not enhance. I also reviewed the MRI of the cervical spine. At C3-C4 there is a right lateral focus with subtle enhancement. There is also a right posterolateral focus adjacent to T1 that does not enhance.   Lab reports from her recent hospital stay were also evaluated. He is JCV antibody negative. Anti-NMO is negative.  She started Tysabri in March 2018.Marland Kitchen   She was started on Tysabri and did well.  She discontinued Tysabri summer 2020 after becoming pregnant.  She started Copaxone.  MRI of the brain after delivery March 2021 showed  one new lesion that was not there previously.  She went back on Tysabri after 01/2021 delivery (son [name])  Imaging studies: MRI od the brain 01/2021 showed T2/FLAIR hyperintense foci in the cerebellum and cerebral hemispheres in a pattern consistent with chronic demyelinating plaque associated with multiple sclerosis.  None of the foci enhanced or appear to be acute.  Compared to the previous MRIs, there are no new lesions.  MRI of the brain 07/09/2019 shows T2/flair hyperintense foci in the right cerebellar hemisphere and in the cerebral hemispheres in a pattern configuration consistent with chronic demyelinating plaque associated with multiple sclerosis.  None of the foci enhance or appear to be acute.  One focus in the right temporal lobe was not apparent on the  previous MRI from 08/16/2018.  I of the brain 08/16/2018 shows T2/flair hyperintense focus adjacent to the fourth ventricle in the right cerebellum and a couple smaller foci in the periventricular deep white matter of the hemispheres consistent with chronic demyelinating plaque associated with multiple sclerosis.  When compared to the MRI dated 09/21/2017, there are no new lesions.  MRI of the cervical spine 09/21/2017 shows small foci within the spinal cord posteriorly adjacent to C2-C3, to the right adjacent to C4 and posteriorly to the right adjacent to T1.  All these foci were present on the 06/08/2016 MRI and were more apparent on the previous MRI than the current scan.  These are consistent with small chronic demyelinating plaques associated with multiple sclerosis   REVIEW OF SYSTEMS: Constitutional: No fevers, chills, sweats, or change in appetite.   She denies much fatigue. Eyes: No visual changes, double vision, eye pain Ear, nose and throat: No hearing loss, ear pain, nasal congestion, sore throat Cardiovascular: No chest pain, palpitations Respiratory:  No shortness of breath at rest or with exertion.   No wheezes GastrointestinaI: No nausea, vomiting, diarrhea, abdominal pain, fecal incontinence,    Has constipation Genitourinary:  No dysuria, urinary retention or frequency.  No nocturia.  Has had some yeast infections.  Musculoskeletal:  No neck pain, back pain Integumentary: No rash, pruritus, skin lesions Neurological: as above Psychiatric: Mood is doing well. Endocrine: No palpitations, diaphoresis, change in appetite, change in weigh or increased thirst Hematologic/Lymphatic:  No anemia, purpura, petechiae. Allergic/Immunologic: No itchy/runny eyes, nasal congestion, recent allergic reactions, rashes  ALLERGIES: No Known Allergies  HOME MEDICATIONS:  Current Outpatient Medications:    natalizumab (TYSABRI) 300 MG/15ML injection, Inject into the vein., Disp: , Rfl:     phentermine 37.5 MG capsule, Take 1 capsule (37.5 mg total) by mouth every morning., Disp: 30 capsule, Rfl: 5  PAST MEDICAL HISTORY: Past Medical History:  Diagnosis Date   Multiple sclerosis (HCC)    Vaginal Pap smear, abnormal    Vision abnormalities     PAST SURGICAL HISTORY: Past Surgical History:  Procedure Laterality Date   CESAREAN SECTION N/A 05/21/2019   Procedure: CESAREAN SECTION;  Surgeon: Ranae Pila, MD;  Location: [facility] LD ORS;  Service: Obstetrics;  Laterality: N/A;   CESAREAN SECTION N/A 01/09/2021   Procedure: REPEAT CESAREAN SECTION EDC: 01-16-21 ALLERG: NKDA  PREVIOUS X 1;  Surgeon: Ranae Pila, MD;  Location: [facility] LD ORS;  Service: Obstetrics;  Laterality: N/A;   CYSTOSCOPY N/A 05/22/2019   Procedure: CYSTOSCOPY;  Surgeon: Ranae Pila, MD;  Location: [facility] LD ORS;  Service: Gynecology;  Laterality: N/A;   LAPAROTOMY N/A 05/22/2019   Procedure: LAPAROTOMY;  Surgeon: Ranae Pila, MD;  Location: [facility] LD ORS;  Service: Gynecology;  Laterality: N/A;   LEEP     suppose to have follow up of cervical cells in January   MYRINGOTOMY     WISDOM TOOTH EXTRACTION      FAMILY HISTORY: Family History  Problem Relation Age of Onset   Heart disease Mother    Colon polyps Father    Healthy Father    Diabetes Maternal Grandmother    Leukemia Paternal Grandfather    Colon cancer Neg Hx    Esophageal cancer Neg Hx    Inflammatory bowel disease Neg Hx    Liver disease Neg Hx    Pancreatic cancer Neg Hx    Rectal cancer Neg Hx     SOCIAL HISTORY:  Social History   Socioeconomic History   Marital status: Married    Spouse name: Not on file   Number of children: Not on file   Years of education: Not on file   Highest education level: Not on file  Occupational History   Not on file  Tobacco Use   Smoking status: Never   Smokeless tobacco: Never  Vaping Use   Vaping Use: Never used  Substance and Sexual Activity   Alcohol use: Not  Currently    Comment: occasional   Drug use: No   Sexual activity: Not Currently    Birth control/protection: None  Other Topics Concern   Not on file  Social History Narrative   Not on file   Social Determinants of Health   Financial Resource Strain: Not on file  Food Insecurity: Not on file  Transportation Needs: Not on file  Physical Activity: Not on file  Stress: Not on file  Social Connections: Not on file  Intimate Partner Violence: Not on file     PHYSICAL EXAM  BP 113/71    Pulse 73    Ht 5\' 4"  (1.626 m)    Wt 165 lb (74.8 kg)    BMI 28.32 kg/m    Body mass index is 28.32 kg/m.   General: The patient is well-developed and well-nourished and in no acute distress   HEENT:   Head is normocephalic and atraumatic.  The ear is no longer tender  Neurologic Exam  Mental status: She has a good affect.. The patient is alert and oriented x 3 at the time of the examination. The patient has apparent normal recent and remote memory, with an apparently normal attention span and concentration ability.   Speech is normal.  Cranial nerves: Extraocular movements are full.  Visual acuity and color vision is symmetric.  Facial strength and sensation is normal.  Trapezius strength is normal.  No obvious hearing deficits are noted.  Motor:  Muscle bulk is normal.   Tone is normal. Strength is  5 / 5 in all 4 extremities.   Sensory: Normal sensation to touch and vibration in the arms and legs.   Gait and station: Station is normal.  Gait is normal.   Tandem is slightly wide.   Romberg is negative.    Reflexes: Deep tendon reflexes are symmetric and normal bilaterally.       DIAGNOSTIC DATA (LABS, IMAGING, TESTING) - I reviewed patient records, labs, notes, testing and imaging myself where available.  Lab Results  Component Value Date   WBC 6.1 06/22/2021   HGB 12.7 06/22/2021   HCT 38.2 06/22/2021   MCV 82 06/22/2021   PLT 255.0 02/13/2021      Component Value  Date/Time   NA 134 (L) 02/02/2021  1621   NA 137 02/12/2019 0850   K 3.8 02/02/2021 1621   CL 102 02/02/2021 1621   CO2 26 02/02/2021 1621   GLUCOSE 98 02/02/2021 1621   BUN 5 (L) 02/02/2021 1621   BUN 7 02/12/2019 0850   CREATININE 0.67 02/02/2021 1621   CALCIUM 8.8 (L) 02/02/2021 1621   PROT 6.5 02/02/2021 1621   PROT 5.9 (L) 02/12/2019 0850   ALBUMIN 3.5 02/02/2021 1621   ALBUMIN 3.5 (L) 02/12/2019 0850   AST 20 02/02/2021 1621   ALT 21 02/02/2021 1621   ALKPHOS 95 02/02/2021 1621   BILITOT 0.4 02/02/2021 1621   BILITOT <0.2 02/12/2019 0850   GFRNONAA >60 02/02/2021 1621   GFRAA >60 04/27/2019 1804       ASSESSMENT AND PLAN  Multiple sclerosis (HCC)  High risk medication use  Other fatigue  Cognitive deficit secondary to multiple sclerosis (HCC)    1.   Continue Tysabri     JCV Ab is negative 2.   Phentermine 37.5 mg daily as stimulant for MS related cognitie issues and may also help weight loss 3.   Stay active and exercise as tolerated.   4.  Return in 5-6 months or sooner if there are new or worsening neurologic symptoms and depending on options of treatment.       Eurydice Calixto A. Epimenio Foot, MD, PhD 07/06/2021, 8:52 AM   Certified in Neurology, Clinical Neurophysiology, Sleep Medicine, Pain Medicine and Neuroimaging  [facility] [address], [address] [place], [place] [address] [phone]

## 2021-12-16 ENCOUNTER — Telehealth: Payer: Self-pay | Admitting: Neurology

## 2021-12-16 ENCOUNTER — Other Ambulatory Visit: Payer: Self-pay | Admitting: Neurology

## 2021-12-16 ENCOUNTER — Other Ambulatory Visit (INDEPENDENT_AMBULATORY_CARE_PROVIDER_SITE_OTHER): Payer: Self-pay

## 2021-12-16 DIAGNOSIS — Z79899 Other long term (current) drug therapy: Secondary | ICD-10-CM

## 2021-12-16 DIAGNOSIS — G35 Multiple sclerosis: Secondary | ICD-10-CM

## 2021-12-16 DIAGNOSIS — Z0289 Encounter for other administrative examinations: Secondary | ICD-10-CM

## 2021-12-16 NOTE — Telephone Encounter (Signed)
Placed JCV lab in [facility] lock box for routine lab pick up. Results pending. 

## 2021-12-17 LAB — CBC WITH DIFFERENTIAL/PLATELET
Basophils Absolute: 0 10*3/uL (ref 0.0–0.2)
Basos: 1 %
EOS (ABSOLUTE): 0.1 10*3/uL (ref 0.0–0.4)
Eos: 1 %
Hematocrit: 38.1 % (ref 34.0–46.6)
Hemoglobin: 12.9 g/dL (ref 11.1–15.9)
Immature Grans (Abs): 0 10*3/uL (ref 0.0–0.1)
Immature Granulocytes: 0 %
Lymphocytes Absolute: 3.4 10*3/uL — ABNORMAL HIGH (ref 0.7–3.1)
Lymphs: 49 %
MCH: 28.5 pg (ref 26.6–33.0)
MCHC: 33.9 g/dL (ref 31.5–35.7)
MCV: 84 fL (ref 79–97)
Monocytes Absolute: 0.5 10*3/uL (ref 0.1–0.9)
Monocytes: 7 %
Neutrophils Absolute: 2.9 10*3/uL (ref 1.4–7.0)
Neutrophils: 42 %
Platelets: 199 10*3/uL (ref 150–450)
RBC: 4.52 x10E6/uL (ref 3.77–5.28)
RDW: 13.2 % (ref 11.7–15.4)
WBC: 6.9 10*3/uL (ref 3.4–10.8)

## 2021-12-28 NOTE — Telephone Encounter (Signed)
JCV ab drawn 12/16/21 negative, index: 0.11.

## 2021-12-31 ENCOUNTER — Encounter: Payer: Self-pay | Admitting: Neurology

## 2022-01-07 ENCOUNTER — Ambulatory Visit: Payer: No Typology Code available for payment source | Admitting: Neurology

## 2022-01-14 ENCOUNTER — Encounter: Payer: Self-pay | Admitting: Neurology

## 2022-05-19 ENCOUNTER — Telehealth: Payer: Self-pay | Admitting: Neurology

## 2022-05-19 ENCOUNTER — Telehealth: Payer: Self-pay | Admitting: *Deleted

## 2022-05-19 ENCOUNTER — Ambulatory Visit: Payer: No Typology Code available for payment source | Admitting: Neurology

## 2022-05-19 ENCOUNTER — Encounter: Payer: Self-pay | Admitting: Neurology

## 2022-05-19 VITALS — BP 115/60 | HR 80 | Ht 61.5 in | Wt 163.0 lb

## 2022-05-19 DIAGNOSIS — G35 Multiple sclerosis: Secondary | ICD-10-CM | POA: Diagnosis not present

## 2022-05-19 DIAGNOSIS — G4700 Insomnia, unspecified: Secondary | ICD-10-CM

## 2022-05-19 DIAGNOSIS — Z3009 Encounter for other general counseling and advice on contraception: Secondary | ICD-10-CM

## 2022-05-19 DIAGNOSIS — Z79899 Other long term (current) drug therapy: Secondary | ICD-10-CM | POA: Diagnosis not present

## 2022-05-19 DIAGNOSIS — H469 Unspecified optic neuritis: Secondary | ICD-10-CM | POA: Diagnosis not present

## 2022-05-19 MED ORDER — DOXEPIN HCL 10 MG PO CAPS: 10.0000 mg | ORAL_CAPSULE | Freq: Every evening | ORAL | 3 refills | Status: DC | PRN

## 2022-05-19 NOTE — Telephone Encounter (Signed)
[employer] sent to GI they obtain auth [id number] 

## 2022-05-19 NOTE — Progress Notes (Signed)
[facility]  PATIENT: Brittany Johnston DOB: 12-Sep-1985  REFERRING DOCTOR OR PCP:  Gevena Cotton [facility])    Fax [phone]  _________________________________   HISTORICAL  CHIEF COMPLAINT:  Chief Complaint  Patient presents with   Follow-up    Patient in room 11, here for follow up MS. Reports doing well.    HISTORY OF PRESENT ILLNESS:  Brittany Johnston is a 37 y.o. woman with relapsing remitting MS on Tysabri.   Update 05/19/2022 She delivered in September 2022 (son, [name]) and went back on Tysabri.   SHe tolerates it well and has no exacerbation.  . She is JCV antibody negative She tolerated Tysabri very well and had no exacerbations while on it.     Shemay try for a third kid later this year.    We can switch to Copaxone  She feels her MS is stable.  No exacerbation or new symptom.   She is on Tysabri and tolerates it well.       Last JCV was negative 06/22/2021 at 0.12.    Gait is doing well.   Balance is good but she holds bannister for safety..    Limbs are strong and no spasticity.   No dysesthesias.   Bladder function is fine.  Vision is good.     She feels forgetful at times but always remembers with a hint.  Focus is reduced   She notes some fatigue. She has insomnia most nights (4 hours)- sleep onset is fine but sleep maintenance sometimes poor..   She denies depression     She has a daughterl just turned 3 and son just turned 1.        MS History   Around 05/20/2016, she had the onset of left eye pain, worse with movement.   Symptoms started while driving and she noted looking to merge was painful. She saw her ophthalmologist and she had an MRI 06/03/2016 showing optic nerve enhancement on the left and a second focus in the right hemisphere that also enhanced.     She noted mild blurry vision shortly after that but it seemed worse 06/05/2016 and the next day, vision was worse.    She went to the [facility] ED and was admitted for 5 days of IV Solu-Medrol (3  days in hospital, 2 as outpatient).  There, she had an MRI of the cervical spine that showed 2 more lesions (at C4C5 and T1) consistent with MS.  In retrospect, a few weeks before the visual symptoms, she noted right hand numbness, especially if she hold her phone which is persisting.     I have reviewed the MRI of the brain dated 06/03/2016 and the MRI of the cervical spine dated 06/08/2016.   The MRI of the brain showed enhancement of the left optic nerve. Additionally in the right temporal lobes there is another T2/FLAIR hyperintense focus that enhances. Additionally there is a periventricular right cerebellar focus that does not enhance. I also reviewed the MRI of the cervical spine. At C3-C4 there is a right lateral focus with subtle enhancement. There is also a right posterolateral focus adjacent to T1 that does not enhance.   Lab reports from her recent hospital stay were also evaluated. He is JCV antibody negative. Anti-NMO is negative.  She started Tysabri in March 2018.Marland Kitchen   She was started on Tysabri and did well.  She discontinued Tysabri summer 2020 after becoming pregnant.  She started Copaxone.  MRI of the brain after  delivery March 2021 showed one new lesion that was not there previously.  She went back on Tysabri after 01/2021 delivery (son [name])  Imaging studies: MRI od the brain 01/2021 showed T2/FLAIR hyperintense foci in the cerebellum and cerebral hemispheres in a pattern consistent with chronic demyelinating plaque associated with multiple sclerosis.  None of the foci enhanced or appear to be acute.  Compared to the previous MRIs, there are no new lesions.  MRI of the brain 07/09/2019 shows T2/flair hyperintense foci in the right cerebellar hemisphere and in the cerebral hemispheres in a pattern configuration consistent with chronic demyelinating plaque associated with multiple sclerosis.  None of the foci enhance or appear to be acute.  One focus in the right temporal lobe was not apparent  on the previous MRI from 08/16/2018.  I of the brain 08/16/2018 shows T2/flair hyperintense focus adjacent to the fourth ventricle in the right cerebellum and a couple smaller foci in the periventricular deep white matter of the hemispheres consistent with chronic demyelinating plaque associated with multiple sclerosis.  When compared to the MRI dated 09/21/2017, there are no new lesions.  MRI of the cervical spine 09/21/2017 shows small foci within the spinal cord posteriorly adjacent to C2-C3, to the right adjacent to C4 and posteriorly to the right adjacent to T1.  All these foci were present on the 06/08/2016 MRI and were more apparent on the previous MRI than the current scan.  These are consistent with small chronic demyelinating plaques associated with multiple sclerosis   REVIEW OF SYSTEMS: Constitutional: No fevers, chills, sweats, or change in appetite.   She denies much fatigue. Eyes: No visual changes, double vision, eye pain Ear, nose and throat: No hearing loss, ear pain, nasal congestion, sore throat Cardiovascular: No chest pain, palpitations Respiratory:  No shortness of breath at rest or with exertion.   No wheezes GastrointestinaI: No nausea, vomiting, diarrhea, abdominal pain, fecal incontinence,    Has constipation Genitourinary:  No dysuria, urinary retention or frequency.  No nocturia.  Has had some yeast infections.  Musculoskeletal:  No neck pain, back pain Integumentary: No rash, pruritus, skin lesions Neurological: as above Psychiatric: Mood is doing well. Endocrine: No palpitations, diaphoresis, change in appetite, change in weigh or increased thirst Hematologic/Lymphatic:  No anemia, purpura, petechiae. Allergic/Immunologic: No itchy/runny eyes, nasal congestion, recent allergic reactions, rashes  ALLERGIES: No Known Allergies  HOME MEDICATIONS:  Current Outpatient Medications:    doxepin (SINEQUAN) 10 MG capsule, Take 1 capsule (10 mg total) by mouth at bedtime as  needed., Disp: 90 capsule, Rfl: 3   natalizumab (TYSABRI) 300 MG/15ML injection, Inject into the vein., Disp: , Rfl:   PAST MEDICAL HISTORY: Past Medical History:  Diagnosis Date   Multiple sclerosis ([facility])    Vaginal Pap smear, abnormal    Vision abnormalities     PAST SURGICAL HISTORY: Past Surgical History:  Procedure Laterality Date   CESAREAN SECTION N/A 05/21/2019   Procedure: CESAREAN SECTION;  Surgeon: Tyson Dense, MD;  Location: [facility] LD ORS;  Service: Obstetrics;  Laterality: N/A;   CESAREAN SECTION N/A 01/09/2021   Procedure: REPEAT CESAREAN SECTION EDC: 01-16-21 ALLERG: NKDA  PREVIOUS X 1;  Surgeon: Tyson Dense, MD;  Location: [facility] LD ORS;  Service: Obstetrics;  Laterality: N/A;   CYSTOSCOPY N/A 05/22/2019   Procedure: CYSTOSCOPY;  Surgeon: Tyson Dense, MD;  Location: [facility] LD ORS;  Service: Gynecology;  Laterality: N/A;   LAPAROTOMY N/A 05/22/2019   Procedure: LAPAROTOMY;  Surgeon: Lucillie Garfinkel  Victorino Dike, MD;  Location: [facility];  Service: Gynecology;  Laterality: N/A;   LEEP     suppose to have follow up of cervical cells in January   MYRINGOTOMY     WISDOM TOOTH EXTRACTION      FAMILY HISTORY: Family History  Problem Relation Age of Onset   Heart disease Mother    Colon polyps Father    Healthy Father    Diabetes Maternal Grandmother    Leukemia Paternal Grandfather    Colon cancer Neg Hx    Esophageal cancer Neg Hx    Inflammatory bowel disease Neg Hx    Liver disease Neg Hx    Pancreatic cancer Neg Hx    Rectal cancer Neg Hx     SOCIAL HISTORY:  Social History   Socioeconomic History   Marital status: Married    Spouse name: Not on file   Number of children: Not on file   Years of education: Not on file   Highest education level: Not on file  Occupational History   Not on file  Tobacco Use   Smoking status: Never   Smokeless tobacco: Never  Vaping Use   Vaping Use: Never used  Substance and Sexual Activity   Alcohol use:  Not Currently    Comment: occasional   Drug use: No   Sexual activity: Not Currently    Birth control/protection: None  Other Topics Concern   Not on file  Social History Narrative   Not on file   Social Determinants of Health   Financial Resource Strain: Not on file  Food Insecurity: Not on file  Transportation Needs: Not on file  Physical Activity: Not on file  Stress: Not on file  Social Connections: Not on file  Intimate Partner Violence: Not on file     PHYSICAL EXAM  BP 115/60 (BP Location: Right Arm, Patient Position: Sitting, Cuff Size: Normal)   Pulse 80   Ht 5' 1.5" (1.562 m)   Wt 163 lb (73.9 kg)   BMI 30.30 kg/m    Body mass index is 30.3 kg/m.   General: The patient is well-developed and well-nourished and in no acute distress   HEENT:   Head is normocephalic and atraumatic.  The ear is no longer tender  Neurologic Exam  Mental status: She has a good affect.. The patient is alert and oriented x 3 at the time of the examination. The patient has apparent normal recent and remote memory, with an apparently normal attention span and concentration ability.   Speech is normal.  Cranial nerves: Extraocular movements are full.  Visual acuity and color vision is symmetric.  Facial strength and sensation is normal.  Trapezius strength is normal.  No obvious hearing deficits are noted.  Motor:  Muscle bulk is normal.   Tone is normal. Strength is  5 / 5 in all 4 extremities.   Sensory: Normal sensation to touch and vibration in the arms and legs.   Gait and station: Station is normal.  Gait is normal.   Tandem gait is slightly wide.   Romberg is negative.    Reflexes: Deep tendon reflexes are symmetric and normal bilaterally.       DIAGNOSTIC DATA (LABS, IMAGING, TESTING) - I reviewed patient records, labs, notes, testing and imaging myself where available.  Lab Results  Component Value Date   WBC 6.9 12/16/2021   HGB 12.9 12/16/2021   HCT 38.1  12/16/2021   MCV 84 12/16/2021   PLT 199  12/16/2021      Component Value Date/Time   NA 134 (L) 02/02/2021 1621   NA 137 02/12/2019 0850   K 3.8 02/02/2021 1621   CL 102 02/02/2021 1621   CO2 26 02/02/2021 1621   GLUCOSE 98 02/02/2021 1621   BUN 5 (L) 02/02/2021 1621   BUN 7 02/12/2019 0850   CREATININE 0.67 02/02/2021 1621   CALCIUM 8.8 (L) 02/02/2021 1621   PROT 6.5 02/02/2021 1621   PROT 5.9 (L) 02/12/2019 0850   ALBUMIN 3.5 02/02/2021 1621   ALBUMIN 3.5 (L) 02/12/2019 0850   AST 20 02/02/2021 1621   ALT 21 02/02/2021 1621   ALKPHOS 95 02/02/2021 1621   BILITOT 0.4 02/02/2021 1621   BILITOT <0.2 02/12/2019 0850   GFRNONAA >60 02/02/2021 1621   GFRAA >60 04/27/2019 1804       ASSESSMENT AND PLAN  Multiple sclerosis (HCC) - Plan: MR BRAIN W WO CONTRAST, Stratify JCV Antibody Test ([facility]), CBC with Differential/Platelet  High risk medication use - Plan: Stratify JCV Antibody Test ([facility]), CBC with Differential/Platelet  Optic neuritis  Insomnia, unspecified type  Family planning counseling    1.   Continue Tysabri     JCV Ab is negative.  We discussed family-planning issues.  She is thinking about trying to have a third baby soon.  We will switch from Tysabri to Copaxone if she becomes pregnant. 2.   Trial of doxepin for insomnia.  3.   Stay active and exercise as tolerated.   4.  Return in 5-6 months or sooner if there are new or worsening neurologic symptoms and depending on options of treatment.       Norvil Martensen A. Felecia Shelling, MD, PhD 0/27/2536, 6:44 PM   Certified in Neurology, Clinical Neurophysiology, Sleep Medicine, Pain Medicine and Neuroimaging  [facility] [address] [place], [place] [address] [phone]

## 2022-05-19 NOTE — Telephone Encounter (Signed)
Placed JCV lab in [facility] lock box for routine lab pick up. Results pending. 

## 2022-05-20 LAB — CBC WITH DIFFERENTIAL/PLATELET
Basophils Absolute: 0 10*3/uL (ref 0.0–0.2)
Basos: 1 %
EOS (ABSOLUTE): 0.1 10*3/uL (ref 0.0–0.4)
Eos: 1 %
Hematocrit: 40.1 % (ref 34.0–46.6)
Hemoglobin: 13.1 g/dL (ref 11.1–15.9)
Immature Grans (Abs): 0 10*3/uL (ref 0.0–0.1)
Immature Granulocytes: 0 %
Lymphocytes Absolute: 4.4 10*3/uL — ABNORMAL HIGH (ref 0.7–3.1)
Lymphs: 53 %
MCH: 28.5 pg (ref 26.6–33.0)
MCHC: 32.7 g/dL (ref 31.5–35.7)
MCV: 87 fL (ref 79–97)
Monocytes Absolute: 0.6 10*3/uL (ref 0.1–0.9)
Monocytes: 7 %
Neutrophils Absolute: 3.1 10*3/uL (ref 1.4–7.0)
Neutrophils: 38 %
Platelets: 240 10*3/uL (ref 150–450)
RBC: 4.59 x10E6/uL (ref 3.77–5.28)
RDW: 12.7 % (ref 11.7–15.4)
WBC: 8.2 10*3/uL (ref 3.4–10.8)

## 2022-05-26 NOTE — Telephone Encounter (Signed)
JCV ab drawn on 05/19/22 negative, index: 0.12.

## 2022-06-09 ENCOUNTER — Ambulatory Visit
Admission: RE | Admit: 2022-06-09 | Discharge: 2022-06-09 | Disposition: A | Discharge status: Home / Self Care | Payer: No Typology Code available for payment source | Source: Ambulatory Visit | Attending: Neurology | Admitting: Neurology

## 2022-06-09 DIAGNOSIS — G35 Multiple sclerosis: Secondary | ICD-10-CM | POA: Diagnosis not present

## 2022-06-09 MED ORDER — GADOPICLENOL 0.5 MMOL/ML IV SOLN
8.0000 mL | Freq: Once | INTRAVENOUS | Status: AC | PRN
  Administered 2022-06-09: 8 mL via INTRAVENOUS

## 2022-09-15 ENCOUNTER — Other Ambulatory Visit: Payer: Self-pay | Admitting: Neurology

## 2022-09-15 MED ORDER — DIAZEPAM 5 MG PO TABS: ORAL_TABLET | ORAL | 0 refills | Status: DC

## 2022-11-10 ENCOUNTER — Other Ambulatory Visit: Payer: Self-pay | Admitting: *Deleted

## 2022-11-10 ENCOUNTER — Other Ambulatory Visit: Payer: Self-pay

## 2022-11-10 ENCOUNTER — Telehealth: Payer: Self-pay | Admitting: *Deleted

## 2022-11-10 DIAGNOSIS — Z79899 Other long term (current) drug therapy: Secondary | ICD-10-CM

## 2022-11-10 DIAGNOSIS — G35 Multiple sclerosis: Secondary | ICD-10-CM

## 2022-11-10 NOTE — Telephone Encounter (Signed)
Placed JCV lab in [facility] lock box for routine lab pick up. Results pending. 

## 2022-11-11 LAB — CBC WITH DIFFERENTIAL/PLATELET
Basophils Absolute: 0 10*3/uL (ref 0.0–0.2)
Basos: 1 %
EOS (ABSOLUTE): 0.1 10*3/uL (ref 0.0–0.4)
Eos: 1 %
Hematocrit: 36.4 % (ref 34.0–46.6)
Hemoglobin: 12 g/dL (ref 11.1–15.9)
Immature Grans (Abs): 0 10*3/uL (ref 0.0–0.1)
Immature Granulocytes: 0 %
Lymphocytes Absolute: 3.4 10*3/uL — ABNORMAL HIGH (ref 0.7–3.1)
Lymphs: 49 %
MCH: 28.3 pg (ref 26.6–33.0)
MCHC: 33 g/dL (ref 31.5–35.7)
MCV: 86 fL (ref 79–97)
Monocytes Absolute: 0.4 10*3/uL (ref 0.1–0.9)
Monocytes: 6 %
Neutrophils Absolute: 3 10*3/uL (ref 1.4–7.0)
Neutrophils: 43 %
Platelets: 181 10*3/uL (ref 150–450)
RBC: 4.24 x10E6/uL (ref 3.77–5.28)
RDW: 13.3 % (ref 11.7–15.4)
WBC: 6.9 10*3/uL (ref 3.4–10.8)

## 2022-11-17 ENCOUNTER — Ambulatory Visit: Payer: No Typology Code available for payment source | Admitting: Neurology

## 2022-11-17 ENCOUNTER — Encounter: Payer: Self-pay | Admitting: Neurology

## 2022-11-17 VITALS — BP 119/70 | HR 74 | Ht 65.0 in | Wt 170.0 lb

## 2022-11-17 DIAGNOSIS — G4700 Insomnia, unspecified: Secondary | ICD-10-CM | POA: Diagnosis not present

## 2022-11-17 DIAGNOSIS — G35 Multiple sclerosis: Secondary | ICD-10-CM

## 2022-11-17 DIAGNOSIS — H469 Unspecified optic neuritis: Secondary | ICD-10-CM | POA: Diagnosis not present

## 2022-11-17 DIAGNOSIS — Z3009 Encounter for other general counseling and advice on contraception: Secondary | ICD-10-CM

## 2022-11-17 DIAGNOSIS — Z79899 Other long term (current) drug therapy: Secondary | ICD-10-CM

## 2022-11-17 MED ORDER — DOXEPIN HCL 10 MG PO CAPS: 10.0000 mg | ORAL_CAPSULE | Freq: Every evening | ORAL | 3 refills | Status: DC | PRN

## 2022-11-17 NOTE — Progress Notes (Signed)
[facility]  PATIENT: Brittany Johnston DOB: [birth year]  REFERRING DOCTOR OR PCP:  [facility])    Fax [phone]  _________________________________   HISTORICAL  CHIEF COMPLAINT:  Chief Complaint  Patient presents with   Follow-up    Pt in room 10 here for MS follow up. Pt reports doing well.     HISTORY OF PRESENT ILLNESS:  Brittany Johnston is a 37 y.o. woman with relapsing remitting MS on Tysabri.   Update 11/17/2022: She is on Tysabri.   SHe tolerates it well and has no exacerbation.  . She is JCV antibody negative.   She has 2 kids age 97 and 1 and is planning another child (has been trying to conceive x 4 months - got pregnant within 1 or 2  month for first 2)  She feels her MS is stable.  No exacerbation or new symptom.   She is on Tysabri and tolerates it well.       Last JCV was negative 7/10/2 at 0.12.    Gait is doing well.   Balance is good but she holds bannister for safety.. She feels unsafe going downstairs fast   Limbs are strong and no spasticity.   No dysesthesias.   Bladder function is fine.  Vision is good.     She notes mild reduced focus and STM,   She notes some fatigue. She has insomnia most nights (4 hours)- sleep onset is fine but sleep maintenance sometimes poor.  Her youngest still wakes up at least once a night. .   She denies depression     She has a daughterl is 3 and son is 1.        MS History   Around 05/20/2016, she had the onset of left eye pain, worse with movement.   Symptoms started while driving and she noted looking to merge was painful. She saw her ophthalmologist and she had an MRI 06/03/2016 showing optic nerve enhancement on the left and a second focus in the right hemisphere that also enhanced.     She noted mild blurry vision shortly after that but it seemed worse 06/05/2016 and the next day, vision was worse.    She went to [facility] ED and was admitted for 5 days of IV Solu-Medrol (3 days in hospital, 2 as  outpatient).  There, she had an MRI of the cervical spine that showed 2 more lesions (at C4C5 and T1) consistent with MS.  In retrospect, a few weeks before the visual symptoms, she noted right hand numbness, especially if she hold her phone which is persisting.     I have reviewed the MRI of the brain dated 06/03/2016 and the MRI of the cervical spine dated 06/08/2016.   The MRI of the brain showed enhancement of the left optic nerve. Additionally in the right temporal lobes there is another T2/FLAIR hyperintense focus that enhances. Additionally there is a periventricular right cerebellar focus that does not enhance. I also reviewed the MRI of the cervical spine. At C3-C4 there is a right lateral focus with subtle enhancement. There is also a right posterolateral focus adjacent to T1 that does not enhance.   Lab reports from her recent hospital stay were also evaluated. He is JCV antibody negative. Anti-NMO is negative.  She started Tysabri in March 2018.Marland Kitchen   She was started on Tysabri and did well.  She discontinued Tysabri summer 2020 after becoming pregnant.  She started Copaxone.  MRI of  the brain after delivery March 2021 showed one new lesion that was not there previously.  She went back on Tysabri after 01/2021 delivery (son [name])  Imaging studies: MRI od the brain 01/2021 showed T2/FLAIR hyperintense foci in the cerebellum and cerebral hemispheres in a pattern consistent with chronic demyelinating plaque associated with multiple sclerosis.  None of the foci enhanced or appear to be acute.  Compared to the previous MRIs, there are no new lesions.  MRI of the brain 07/09/2019 shows T2/flair hyperintense foci in the right cerebellar hemisphere and in the cerebral hemispheres in a pattern configuration consistent with chronic demyelinating plaque associated with multiple sclerosis.  None of the foci enhance or appear to be acute.  One focus in the right temporal lobe was not apparent on the previous MRI from  08/16/2018.  I of the brain 08/16/2018 shows T2/flair hyperintense focus adjacent to the fourth ventricle in the right cerebellum and a couple smaller foci in the periventricular deep white matter of the hemispheres consistent with chronic demyelinating plaque associated with multiple sclerosis.  When compared to the MRI dated 09/21/2017, there are no new lesions.  MRI of the cervical spine 09/21/2017 shows small foci within the spinal cord posteriorly adjacent to C2-C3, to the right adjacent to C4 and posteriorly to the right adjacent to T1.  All these foci were present on the 06/08/2016 MRI and were more apparent on the previous MRI than the current scan.  These are consistent with small chronic demyelinating plaques associated with multiple sclerosis   REVIEW OF SYSTEMS: Constitutional: No fevers, chills, sweats, or change in appetite.   She denies much fatigue. Eyes: No visual changes, double vision, eye pain Ear, nose and throat: No hearing loss, ear pain, nasal congestion, sore throat Cardiovascular: No chest pain, palpitations Respiratory:  No shortness of breath at rest or with exertion.   No wheezes GastrointestinaI: No nausea, vomiting, diarrhea, abdominal pain, fecal incontinence,    Has constipation Genitourinary:  No dysuria, urinary retention or frequency.  No nocturia.  Has had some yeast infections.  Musculoskeletal:  No neck pain, back pain Integumentary: No rash, pruritus, skin lesions Neurological: as above Psychiatric: Mood is doing well. Endocrine: No palpitations, diaphoresis, change in appetite, change in weigh or increased thirst Hematologic/Lymphatic:  No anemia, purpura, petechiae. Allergic/Immunologic: No itchy/runny eyes, nasal congestion, recent allergic reactions, rashes  ALLERGIES: No Known Allergies  HOME MEDICATIONS:  Current Outpatient Medications:    natalizumab (TYSABRI) 300 MG/15ML injection, Inject into the vein., Disp: , Rfl:    NONFORMULARY OR  COMPOUNDED ITEM, Pt takes supplements: Advanced TUDCA- 1 pill in pm BC-ATP- 1 pill in am and 1 pill in pm Biotoxin Binder 1 pill in am and 1 pill in pm  Ct-Mineral 1 pill in pm, Disp: , Rfl:    diazepam (VALIUM) 5 MG tablet, Take one or two pills a day as needed (Patient not taking: Reported on 11/17/2022), Disp: 20 tablet, Rfl: 0   doxepin (SINEQUAN) 10 MG capsule, Take 1 capsule (10 mg total) by mouth at bedtime as needed., Disp: 90 capsule, Rfl: 3  PAST MEDICAL HISTORY: Past Medical History:  Diagnosis Date   Multiple sclerosis (HCC)    Vaginal Pap smear, abnormal    Vision abnormalities     PAST SURGICAL HISTORY: Past Surgical History:  Procedure Laterality Date   CESAREAN SECTION N/A 05/21/2019   Procedure: CESAREAN SECTION;  Surgeon: Ranae Pila, MD;  Location: [facility]S;  Service: Obstetrics;  Laterality: N/A;  CESAREAN SECTION N/A 01/09/2021   Procedure: REPEAT CESAREAN SECTION EDC: 01-16-21 ALLERG: NKDA  PREVIOUS X 1;  Surgeon: Ranae Pila, MD;  Location: [facility];  Service: Obstetrics;  Laterality: N/A;   CYSTOSCOPY N/A 05/22/2019   Procedure: CYSTOSCOPY;  Surgeon: Ranae Pila, MD;  Location: [facility];  Service: Gynecology;  Laterality: N/A;   LAPAROTOMY N/A 05/22/2019   Procedure: LAPAROTOMY;  Surgeon: Ranae Pila, MD;  Location: [facility];  Service: Gynecology;  Laterality: N/A;   LEEP     suppose to have follow up of cervical cells in January   MYRINGOTOMY     WISDOM TOOTH EXTRACTION      FAMILY HISTORY: Family History  Problem Relation Age of Onset   Heart disease Mother    Colon polyps Father    Healthy Father    Diabetes Maternal Grandmother    Leukemia Paternal Grandfather    Colon cancer Neg Hx    Esophageal cancer Neg Hx    Inflammatory bowel disease Neg Hx    Liver disease Neg Hx    Pancreatic cancer Neg Hx    Rectal cancer Neg Hx     SOCIAL HISTORY:  Social History   Socioeconomic History   Marital status:  Married    Spouse name: Not on file   Number of children: Not on file   Years of education: Not on file   Highest education level: Not on file  Occupational History   Not on file  Tobacco Use   Smoking status: Never   Smokeless tobacco: Never  Vaping Use   Vaping status: Never Used  Substance and Sexual Activity   Alcohol use: Not Currently    Comment: occasional   Drug use: No   Sexual activity: Not Currently    Birth control/protection: None  Other Topics Concern   Not on file  Social History Narrative   Not on file   Social Determinants of Health   Financial Resource Strain: Not on file  Food Insecurity: No Food Insecurity (12/16/2020)   Received from [facility], [facility]   Hunger Vital Sign    Worried About Running Out of Food in the Last Year: Never true    Ran Out of Food in the Last Year: Never true  Transportation Needs: Not on file  Physical Activity: Not on file  Stress: Not on file  Social Connections: Unknown (09/11/2021)   Received from [facility], [facility]   Social Network    Social Network: Not on file  Intimate Partner Violence: Unknown (08/05/2021)   Received from [facility], [facility]   HITS    Physically Hurt: Not on file    Insult or Talk Down To: Not on file    Threaten Physical Harm: Not on file    Scream or Curse: Not on file     PHYSICAL EXAM  BP 119/70 (BP Location: Left Arm, Patient Position: Sitting, Cuff Size: Normal)   Pulse 74   Ht 5\' 5"  (1.651 m)   Wt 170 lb (77.1 kg)   BMI 28.29 kg/m    Body mass index is 28.29 kg/m.   General: The patient is well-developed and well-nourished and in no acute distress   HEENT:   Head is normocephalic and atraumatic.  The ear is no longer tender  Neurologic Exam  Mental status: She has a good affect.. The patient is alert and oriented x 3 at the time of the examination. The patient has  apparent normal recent and remote memory, with an apparently normal attention span  and concentration ability.   Speech is normal.  Cranial nerves: Extraocular movements are full.  Visual acuity and color vision is symmetric.  Facial strength and sensation is normal.  Trapezius strength is normal.  No obvious hearing deficits are noted.  Motor:  Muscle bulk is normal.   Tone is normal. Strength is  5 / 5 in all 4 extremities.   Sensory: Normal sensation to touch and vibration in the arms and legs.   Gait and station: Station is normal.  The gait is normal.  Tandem gait is mildly wide.  Romberg is negative.    Reflexes: Deep tendon reflexes are symmetric and normal bilaterally.       DIAGNOSTIC DATA (LABS, IMAGING, TESTING) - I reviewed patient records, labs, notes, testing and imaging myself where available.  Lab Results  Component Value Date   WBC 6.9 11/10/2022   HGB 12.0 11/10/2022   HCT 36.4 11/10/2022   MCV 86 11/10/2022   PLT 181 11/10/2022      Component Value Date/Time   NA 134 (L) 02/02/2021 1621   NA 137 02/12/2019 0850   K 3.8 02/02/2021 1621   CL 102 02/02/2021 1621   CO2 26 02/02/2021 1621   GLUCOSE 98 02/02/2021 1621   BUN 5 (L) 02/02/2021 1621   BUN 7 02/12/2019 0850   CREATININE 0.67 02/02/2021 1621   CALCIUM 8.8 (L) 02/02/2021 1621   PROT 6.5 02/02/2021 1621   PROT 5.9 (L) 02/12/2019 0850   ALBUMIN 3.5 02/02/2021 1621   ALBUMIN 3.5 (L) 02/12/2019 0850   AST 20 02/02/2021 1621   ALT 21 02/02/2021 1621   ALKPHOS 95 02/02/2021 1621   BILITOT 0.4 02/02/2021 1621   BILITOT <0.2 02/12/2019 0850   GFRNONAA >60 02/02/2021 1621   GFRAA >60 04/27/2019 1804       ASSESSMENT AND PLAN  Multiple sclerosis (HCC)  High risk medication use  Optic neuritis  Insomnia, unspecified type  Family planning counseling    1.   Continue Tysabri     JCV Ab is negative.  We discussed family-planning issues.  She is hoping to conceive a third baby soon.  We will switch from Tysabri to Copaxone if she becomes pregnant. And ten switch back shortly  after delivery 2.   Trial of low dose doxepin for insomnia.  If becomes pregnant will stop. 3.   Stay active and exercise as tolerated.   4.  Return in 5-6 months or sooner if there are new or worsening neurologic symptoms and depending on options of treatment.       Geneva Pallas A. Epimenio Foot, MD, PhD 11/17/2022, 5:36 PM   Certified in Neurology, Clinical Neurophysiology, Sleep Medicine, Pain Medicine and Neuroimaging  [facility] [address] [place], [place] [address] [phone]

## 2022-11-17 NOTE — Telephone Encounter (Signed)
Received results from [facility];  JCV antibody Negative Index 0.12.

## 2022-11-19 ENCOUNTER — Encounter: Payer: Self-pay | Admitting: Neurology

## 2023-01-17 ENCOUNTER — Telehealth: Payer: Self-pay | Admitting: *Deleted

## 2023-01-17 NOTE — Telephone Encounter (Signed)
Received MS TOUCH form completed to Dr. Marjory Lies to sign.

## 2023-02-01 IMAGING — CT CT ABD-PELV W/ CM
2 of 4 series · 15 of 46 positions shown, 17 images · IV contrast (APPLIED)
Comparison: Ultrasound from earlier in the same day.

CLINICAL DATA: Abdominal pain, history of recent cesarean section 1
month ago, initial encounter

EXAM:
CT ABDOMEN AND PELVIS WITH CONTRAST
TECHNIQUE: Multidetector CT imaging of the abdomen and pelvis was performed
using the standard protocol following bolus administration of
intravenous contrast.
CONTRAST:  80mL OMNIPAQUE IOHEXOL 350 MG/ML SOLN

[Series 3: abdomen 5.0 · axial · 0.98mm/px · z∈[-741,-341]mm · 12 of 91 slices shown, 14 images]
[im 6/91  soft-tissue]
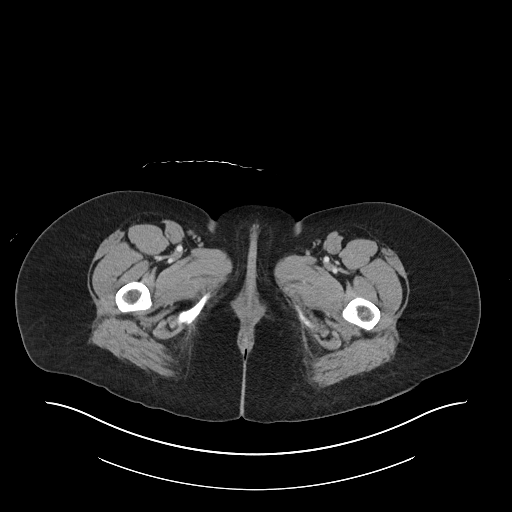
[im 6/91  bone]
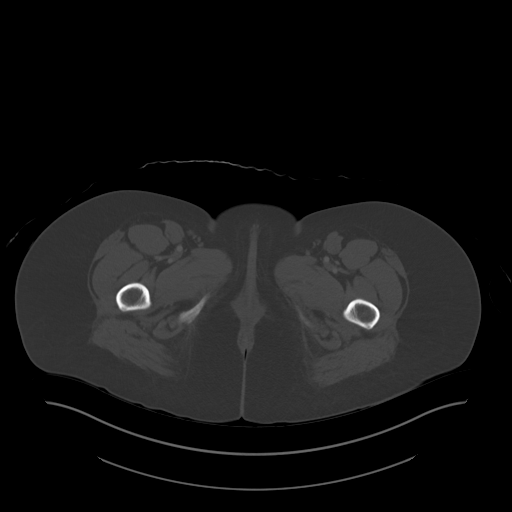
[im 16/91  soft-tissue]
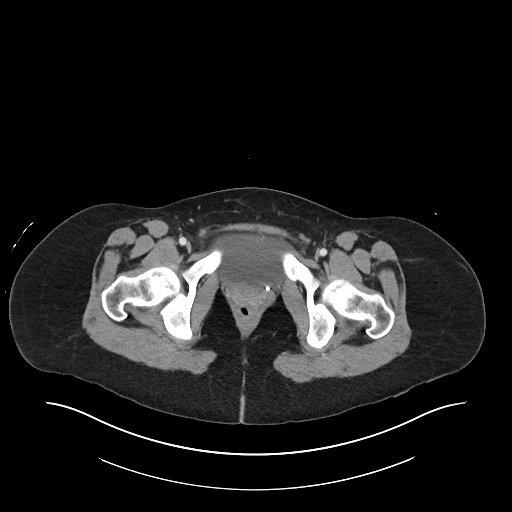
[im 21/91  soft-tissue]
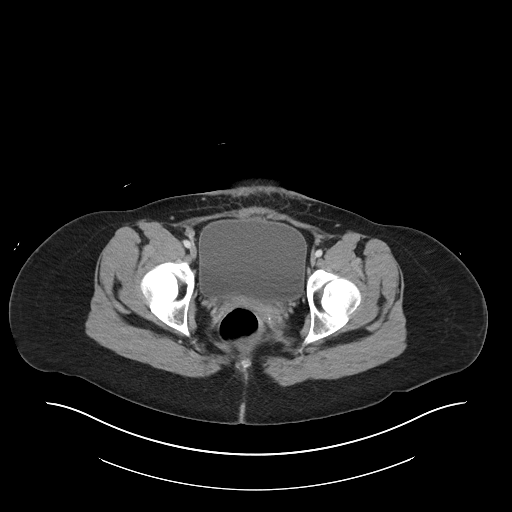
[im 26/91  soft-tissue]
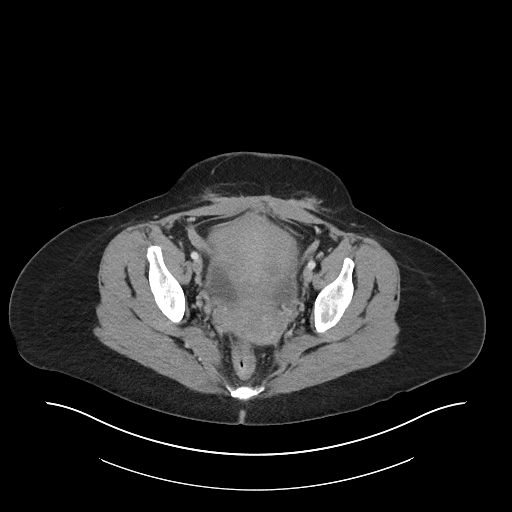
[im 36/91  soft-tissue]
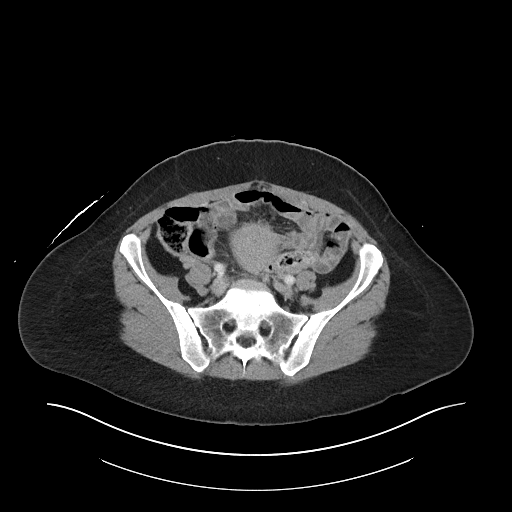
[im 41/91  soft-tissue]
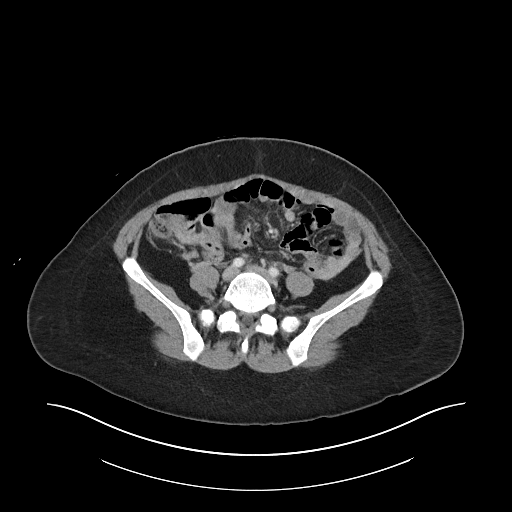
[im 51/91  soft-tissue]
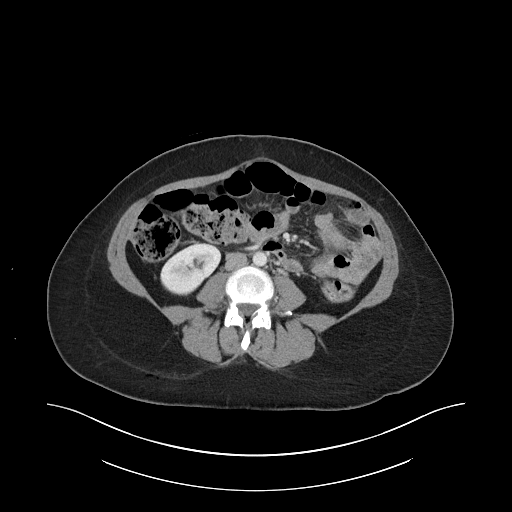
[im 56/91  soft-tissue]
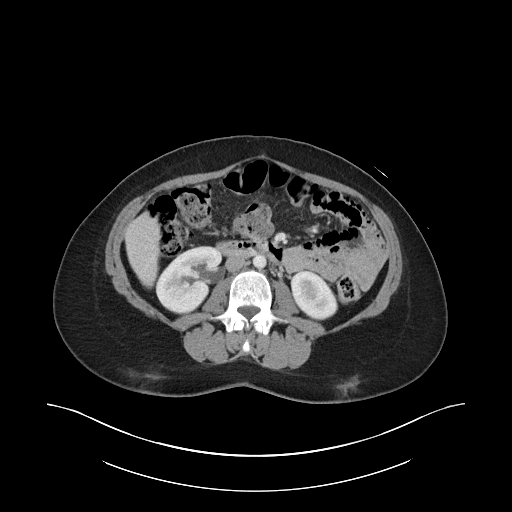
[im 66/91  soft-tissue]
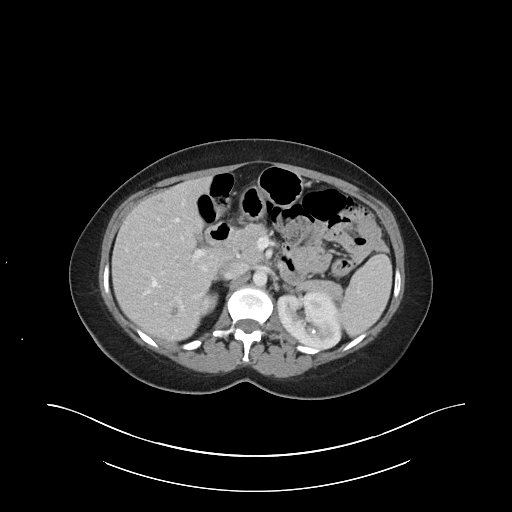
[im 66/91  bone]
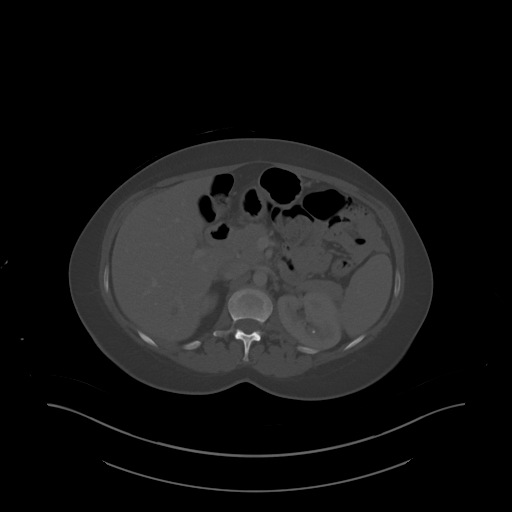
[im 71/91  soft-tissue]
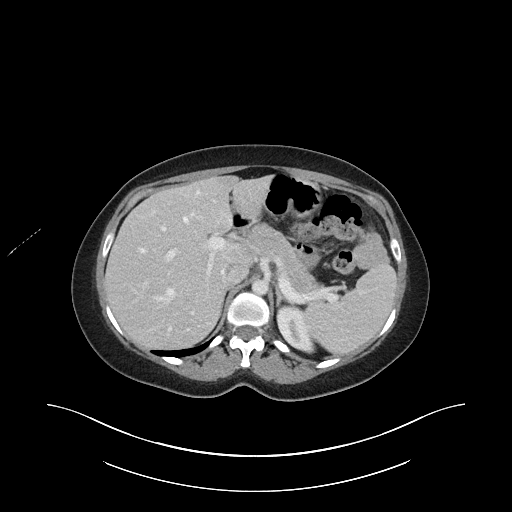
[im 76/91  soft-tissue]
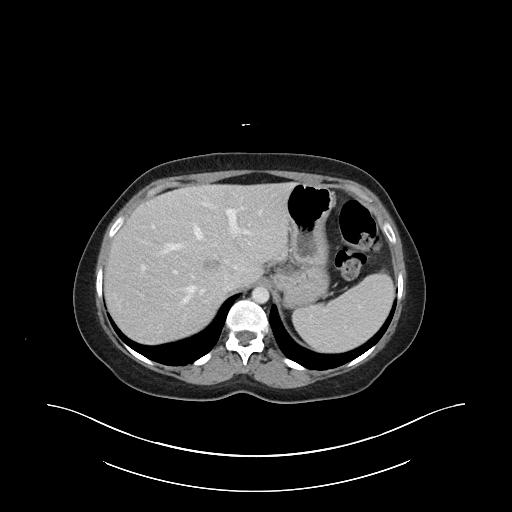
[im 86/91  soft-tissue]
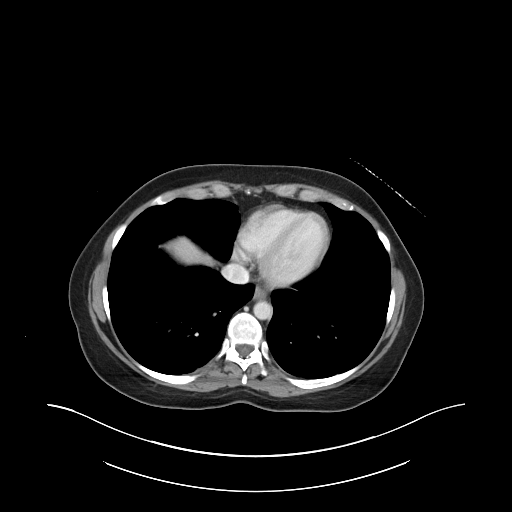

[Series 6: abdomen 3.0 mpr cor · coronal · 0.88mm/px · 3 of 103 slices shown]
[im 35/103  soft-tissue]
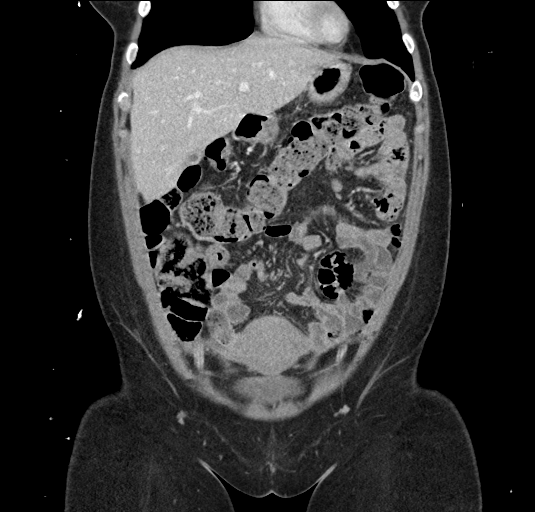
[im 46/103  soft-tissue]
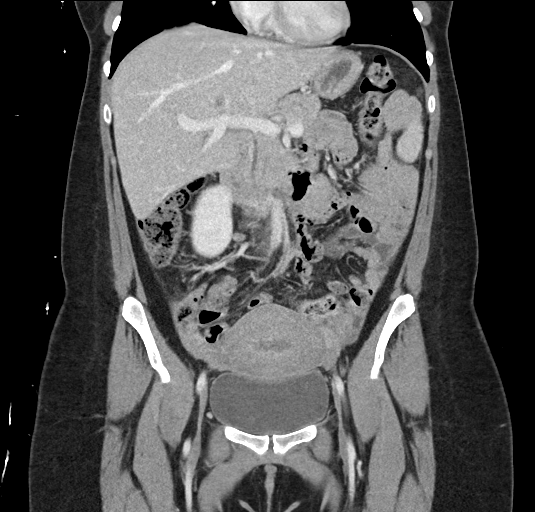
[im 57/103  soft-tissue]
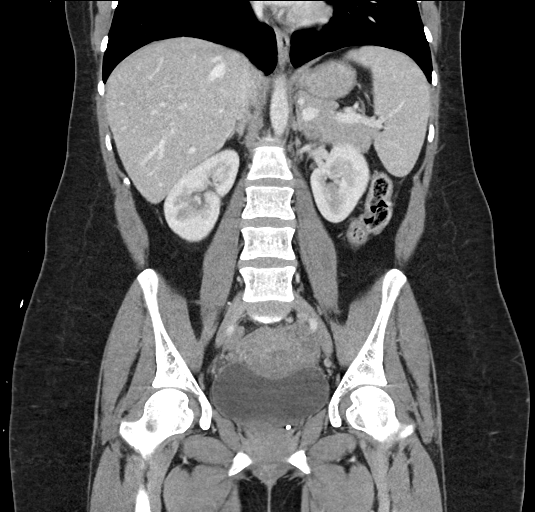

[15 of 46 positions shown; findings below may reference images not displayed]

FINDINGS: Lower chest: No acute abnormality. Liver is fatty infiltrated. There
are hypodensities identified scattered throughout the liver. The
largest of these is noted centrally and corresponds to the
hyperechoic focus seen on recent ultrasound examination. This likely
represents a hemangioma although not clarified on this exam.
Gallbladder is decompressed. The known cholelithiasis is not
appreciated on this study.

Hepatobiliary: No focal liver abnormality is seen. No gallstones,
gallbladder wall thickening, or biliary dilatation.

Pancreas: Unremarkable. No pancreatic ductal dilatation or
surrounding inflammatory changes.

Spleen: Normal in size without focal abnormality.

Adrenals/Urinary Tract: Adrenal glands are within normal limits.
Kidneys demonstrate a normal enhancement pattern bilaterally.
Multiple renal calculi are noted on the left the largest of which
measures approximately 4 mm. No obstructive changes are noted on the
left. Mild fullness of the right renal collecting system is noted.
Right ureter appears within normal limits. No obstructive changes
are seen. Bladder is well distended.

Stomach/Bowel: Appendix is well visualized and partially air-filled.
No inflammatory changes to suggest appendicitis are noted. Stomach
is unremarkable. Small bowel and colon are within normal limits.

Vascular/Lymphatic: No significant vascular findings are present. No
enlarged abdominal or pelvic lymph nodes.

Reproductive: Changes consistent with recent cesarean section are
noted. Mild inflammatory change along the surgical scar is noted
although no abscess is seen. Fluid is noted within the endometrial
canal of uncertain significance. No air is identified to suggest
endometritis. Adnexa appear within normal limits.

Other: No abdominal wall hernia or abnormality. No abdominopelvic
ascites.

Musculoskeletal: No acute or significant osseous findings.
IMPRESSION: Changes consistent with recent cesarean section. Mild inflammatory
changes are noted along the surgical scar although no abscess is
seen.

Fluid is noted within the endometrial canal of uncertain
significance no findings to suggest focal abscess are identified.

Nonobstructing left renal calculi.

Fatty liver. Hypodensities are noted within the liver which
correspond to that seen on recent ultrasound and likely represent
hemangiomas but incompletely evaluated on this exam. Again MRI is
recommended on a nonemergent basis for further characterization.

Known cholelithiasis is not appreciated on this study.

## 2023-02-01 IMAGING — US US ABDOMEN LIMITED
1 series · 15 of 25 positions shown · non-contrast
Comparison: Abdominal ultrasound 08/08/2007, CT abdomen/pelvis
08/07/2019

CLINICAL DATA: Quadrant pain

EXAM:
ULTRASOUND ABDOMEN LIMITED RIGHT UPPER QUADRANT

[Series 1: us abdomen limited · 15 of 58 slices shown]
[im 1/58]
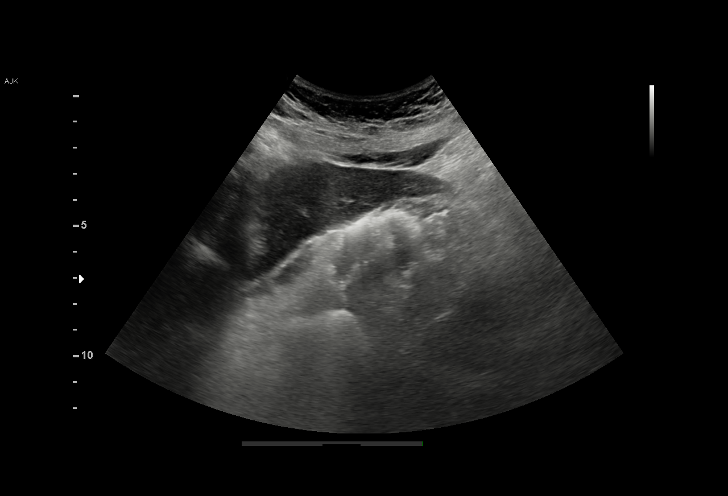
[im 5/58]
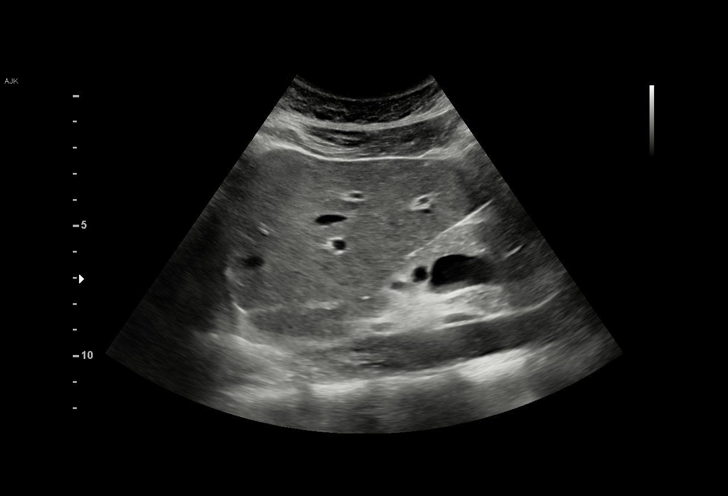
[im 10/58]
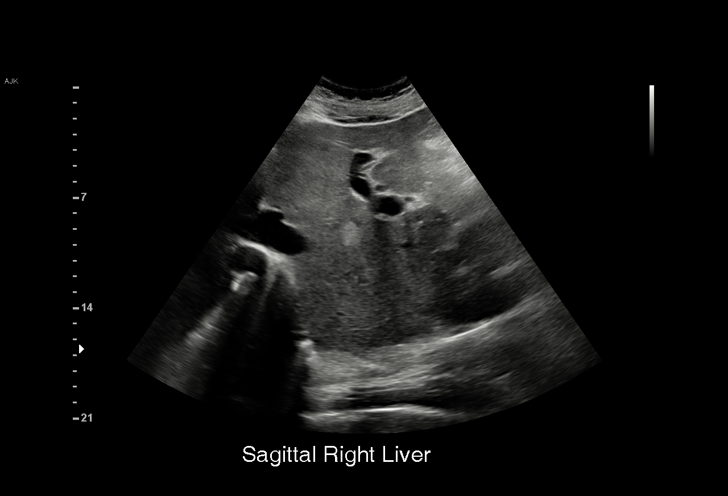
[im 12/58]
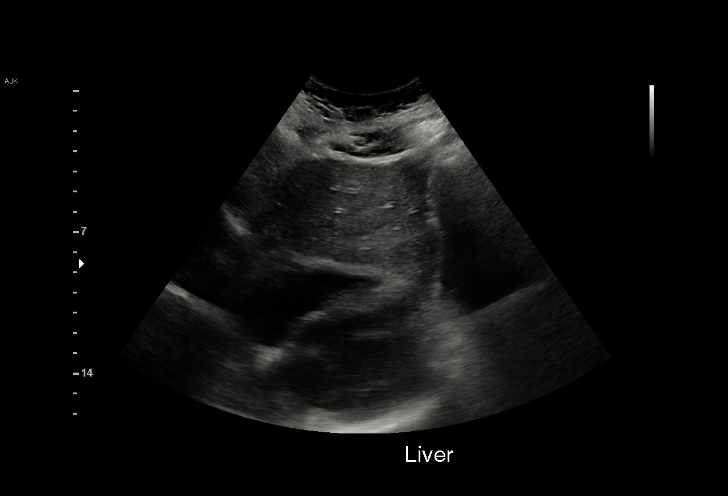
[im 17/58]
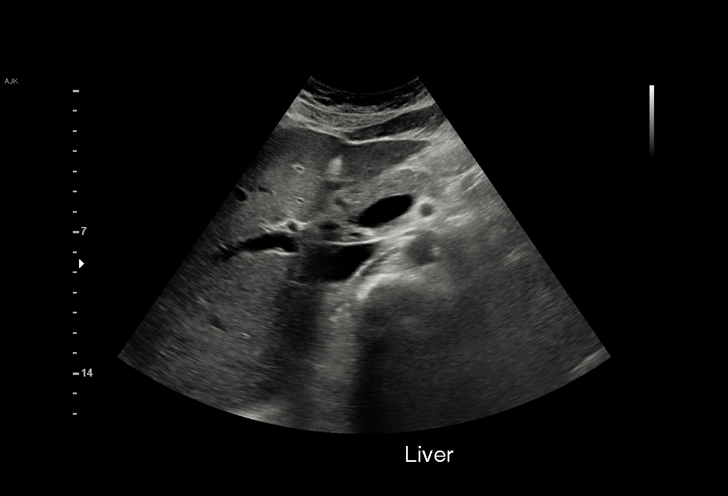
[im 22/58]
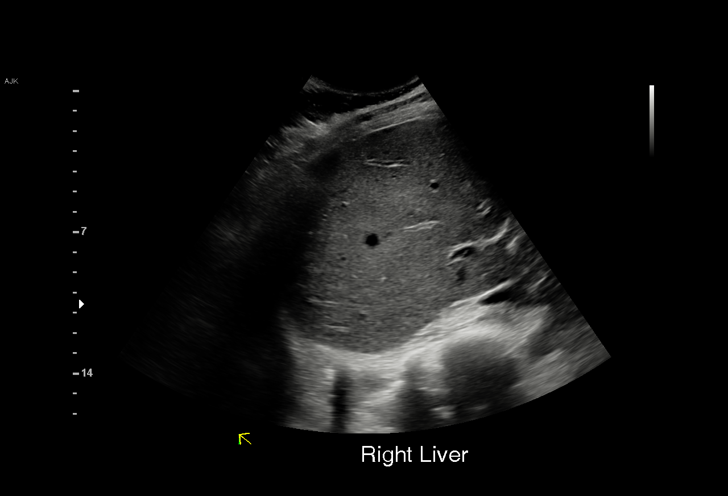
[im 24/58]
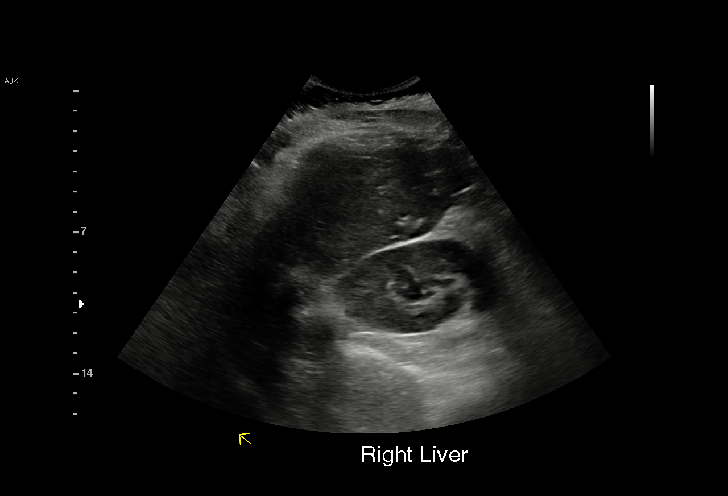
[im 29/58]
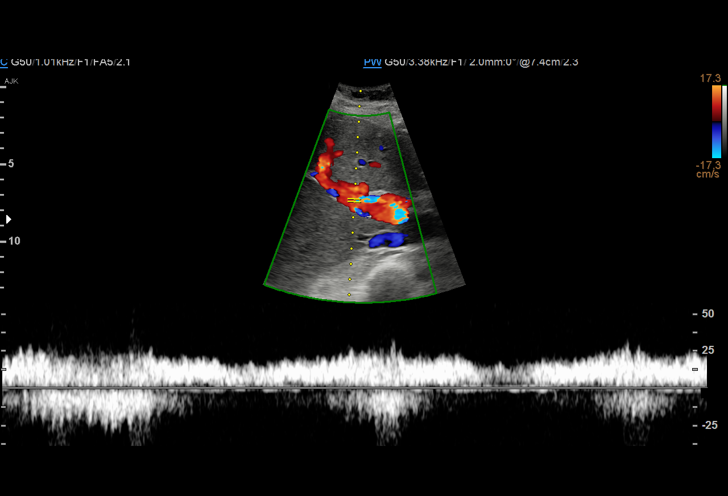
[im 34/58]
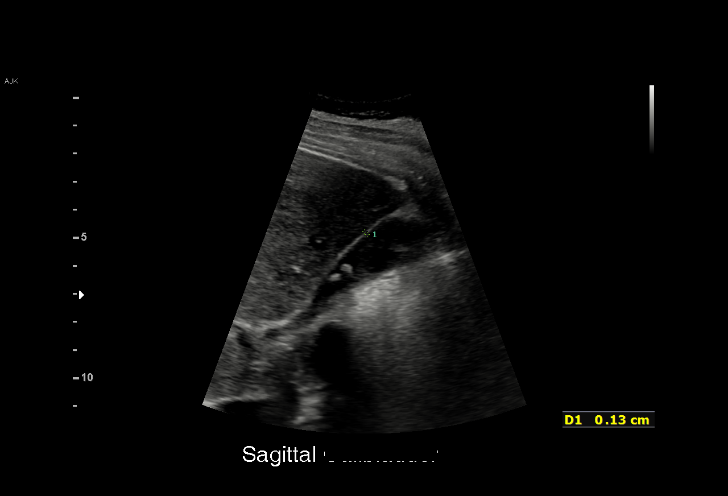
[im 36/58]
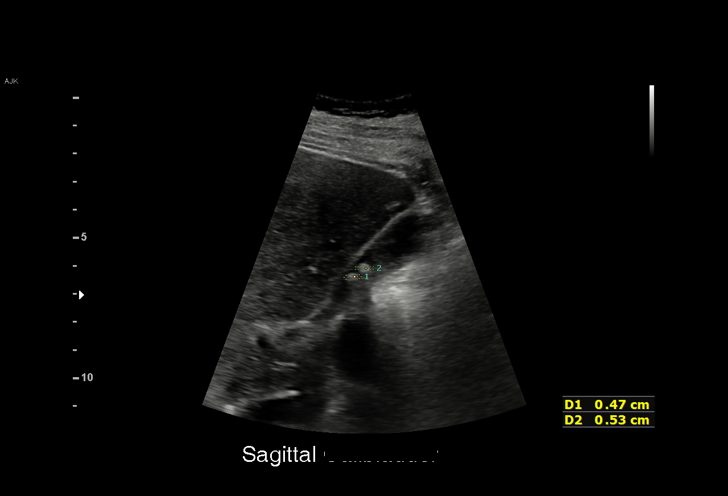
[im 41/58]
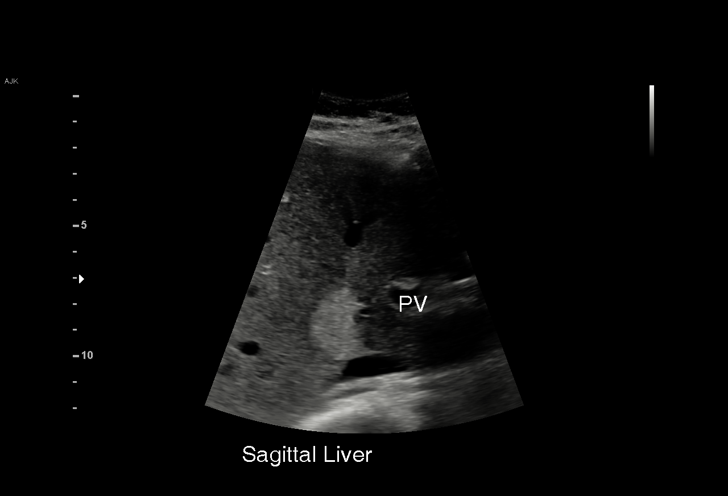
[im 46/58]
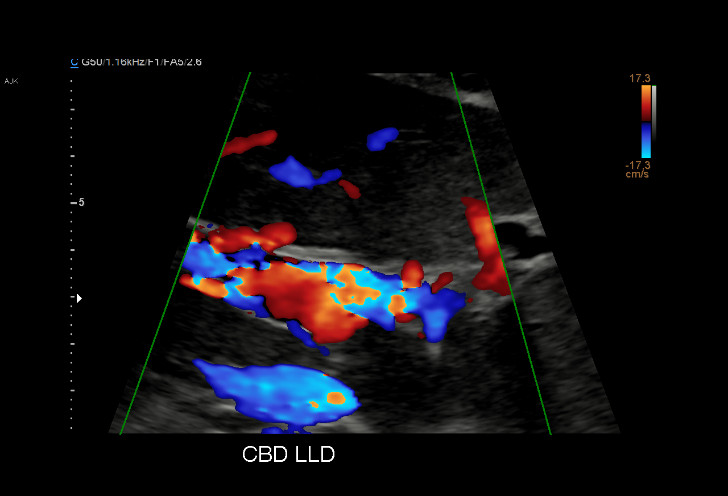
[im 48/58]
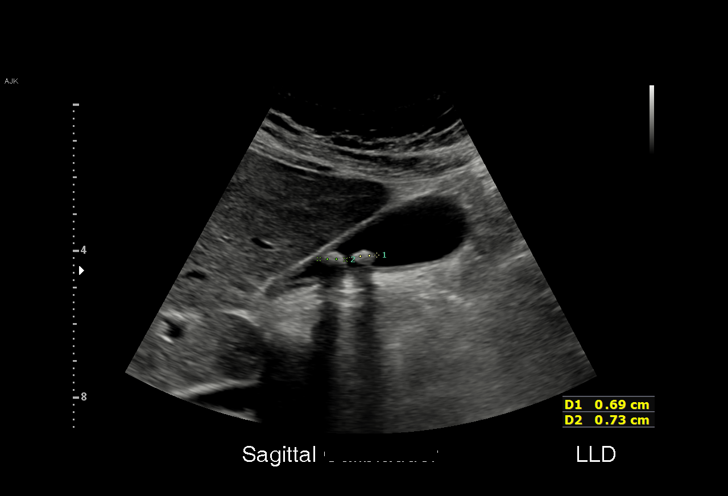
[im 53/58]
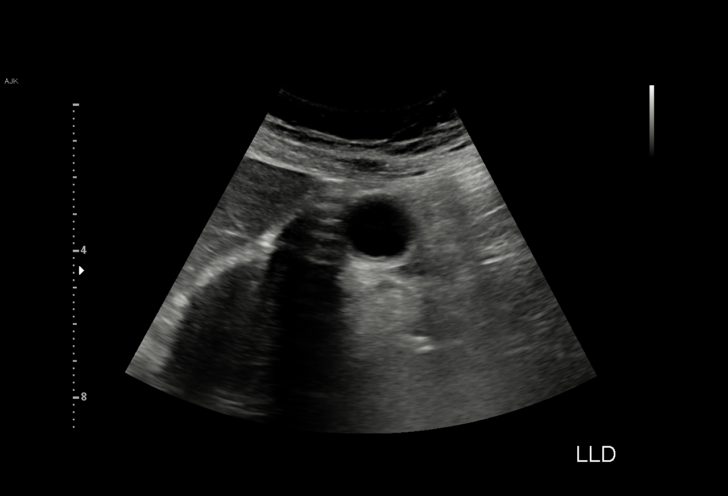
[im 58/58]
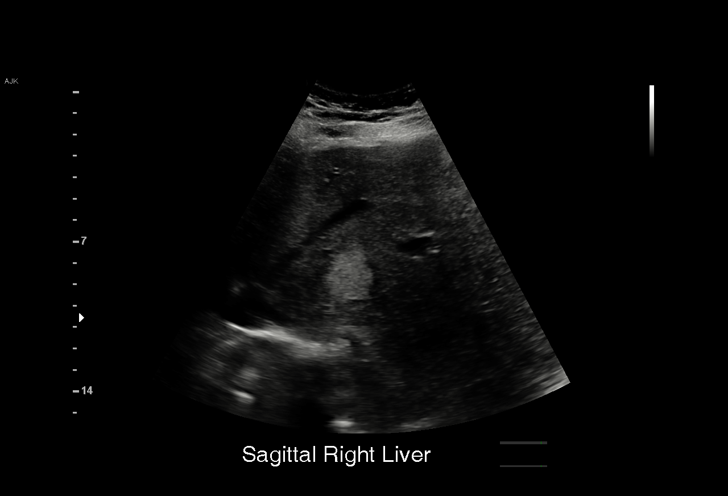

[15 of 25 positions shown; findings below may reference images not displayed]

FINDINGS: Gallbladder:

Calcified gallstones are seen measuring up to 7 mm. The gallbladder
is contracted. There is no gallbladder wall thickening or
pericholecystic fluid. No positive sonographic Murphy's sign was
reported by the sonographer.

Common bile duct:

Diameter: 2 mm

Liver:

There is a focal hyperechoic area in the right hepatic lobe
measuring 2.4 cm x 2.2 cm x 2.0 cm. Parenchymal echogenicity is
otherwise normal. Portal vein is patent on color Doppler imaging
with normal direction of blood flow towards the liver.

Other: None.
IMPRESSION: 1. Cholelithiasis without evidence of acute cholecystitis.
2. Hyperechoic lesion in the liver measuring up to 2.4 cm may
reflect a hemangioma. Recommend MRI of the abdomen with and without
contrast for definitive characterization.

## 2023-04-05 ENCOUNTER — Encounter: Payer: Self-pay | Admitting: Neurology

## 2023-04-06 ENCOUNTER — Telehealth: Payer: Self-pay | Admitting: *Deleted

## 2023-04-06 NOTE — Telephone Encounter (Signed)
Faxed and confirmation received.

## 2023-04-06 NOTE — Telephone Encounter (Signed)
Per Dr.Sater " We have had her on glatiramer in the past during pregnancy at the 40 mg 3 times a week dose.  Could you please forward a service request form for her to sign so we can send the sent  Thank you

## 2023-04-13 NOTE — Telephone Encounter (Signed)
Submitted PA for glatiramer to [facility] via C[facility]. Key: [id number]. Should have a determination within 1-3 business days.

## 2023-04-18 NOTE — Telephone Encounter (Signed)
Oswaldo Done from [facility] is asking for a call from clinical staff to clarify the Rx on pt's Glatiramer.  Oswaldo Done can be reached at [phone]

## 2023-04-18 NOTE — Telephone Encounter (Signed)
I called [facility] back and per approval for glatiramir would proceed with generic. (They were clarifying as pt had received BRAND in past.  They will go ahead and proceed,  spoke to [place].

## 2023-05-17 LAB — HEPATITIS C ANTIBODY: HCV Ab: NEGATIVE

## 2023-05-17 LAB — OB RESULTS CONSOLE HIV ANTIBODY (ROUTINE TESTING): HIV: NONREACTIVE

## 2023-05-17 LAB — OB RESULTS CONSOLE RUBELLA ANTIBODY, IGM: Rubella: IMMUNE

## 2023-05-17 LAB — OB RESULTS CONSOLE HEPATITIS B SURFACE ANTIGEN: Hepatitis B Surface Ag: NEGATIVE

## 2023-05-31 ENCOUNTER — Ambulatory Visit: Payer: No Typology Code available for payment source | Admitting: Neurology

## 2023-05-31 ENCOUNTER — Encounter: Payer: Self-pay | Admitting: Neurology

## 2023-05-31 VITALS — BP 112/72 | HR 71 | Ht 64.0 in | Wt 177.5 lb

## 2023-05-31 DIAGNOSIS — Z3A12 12 weeks gestation of pregnancy: Secondary | ICD-10-CM

## 2023-05-31 DIAGNOSIS — Z79899 Other long term (current) drug therapy: Secondary | ICD-10-CM | POA: Diagnosis not present

## 2023-05-31 DIAGNOSIS — H469 Unspecified optic neuritis: Secondary | ICD-10-CM

## 2023-05-31 DIAGNOSIS — G35 Multiple sclerosis: Secondary | ICD-10-CM | POA: Diagnosis not present

## 2023-05-31 NOTE — Progress Notes (Signed)
[facility]  PATIENT: Brittany Johnston DOB: [birth year]  REFERRING DOCTOR OR PCP:  [facility])      _________________________________   HISTORICAL  CHIEF COMPLAINT:  Chief Complaint  Patient presents with   Room 10    Pt is here Alone. Pt states that things have been going well since her last appointment. Pt states that she has just received her medication and has missed her medication 1 week out due to her [employer].     HISTORY OF PRESENT ILLNESS:  Brittany Johnston is a 38 y.o. woman with relapsing remitting MS on Tysabri.   Update 05/30/2022 She is [redacted] weeks pregnant (due August 10).  She has 2 other kids.    She is starting glatiramer.   Her last Tysabri was in [facility] 2024.    She is not planning on breastfeeding.     She was on Tysabri.   SHe tolerates it well and has no exacerbation.  . She is JCV antibody negative.   She has 2 kids age 41 and 1 and is   She feels her MS is stable.  No exacerbation or new symptom.   She is on Tysabri and tolerates it well.       Last JCV was negative 7/10/2 at 0.12.    Gait is doing well.   Balance is good but she holds bannister for safety.. She feels unsafe going downstairs fast   Limbs are strong and no spasticity.   No dysesthesias.   Bladder function is fine.  Vision is normal and symmetric.  Color vision is symmetric.  She notes mild reduced focus and STM,   She sometimes has word finding errors.  She notes some fatigue. She has insomnia most nights (4 hours)- sleep onset is fine but sleep maintenance sometimes poor.  .   She denies depression     She has a daughterl is 4 and son is 2.        MS History   Around 05/20/2016, she had the onset of left eye pain, worse with movement.   Symptoms started while driving and she noted looking to merge was painful. She saw her ophthalmologist and she had an MRI 06/03/2016 showing optic nerve enhancement on the left and a second focus in the right  hemisphere that also enhanced.     She noted mild blurry vision shortly after that but it seemed worse 06/05/2016 and the next day, vision was worse.    She went to the [facility] ED and was admitted for 5 days of IV Solu-Medrol (3 days in hospital, 2 as outpatient).  There, she had an MRI of the cervical spine that showed 2 more lesions (at C4C5 and T1) consistent with MS.  In retrospect, a few weeks before the visual symptoms, she noted right hand numbness, especially if she hold her phone which is persisting.     I have reviewed the MRI of the brain dated 06/03/2016 and the MRI of the cervical spine dated 06/08/2016.   The MRI of the brain showed enhancement of the left optic nerve. Additionally in the right temporal lobes there is another T2/FLAIR hyperintense focus that enhances. Additionally there is a periventricular right cerebellar focus that does not enhance. I also reviewed the MRI of the cervical spine. At C3-C4 there is a right lateral focus with subtle enhancement. There is also a right posterolateral focus adjacent to T1 that does not enhance.   Lab reports from her  recent hospital stay were also evaluated. He is JCV antibody negative. Anti-NMO is negative.  She started Tysabri in March 2018.Marland Kitchen   She was started on Tysabri and did well.  She discontinued Tysabri summer 2020 after becoming pregnant.  She started Copaxone.  MRI of the brain after delivery March 2021 showed one new lesion that was not there previously.  She went back on Tysabri after 01/2021 delivery (son [name])  Imaging studies: MRI od the brain 01/2021 showed T2/FLAIR hyperintense foci in the cerebellum and cerebral hemispheres in a pattern consistent with chronic demyelinating plaque associated with multiple sclerosis.  None of the foci enhanced or appear to be acute.  Compared to the previous MRIs, there are no new lesions.  MRI of the brain 07/09/2019 shows T2/flair hyperintense foci in the right cerebellar hemisphere and in the cerebral  hemispheres in a pattern configuration consistent with chronic demyelinating plaque associated with multiple sclerosis.  None of the foci enhance or appear to be acute.  One focus in the right temporal lobe was not apparent on the previous MRI from 08/16/2018.  I of the brain 08/16/2018 shows T2/flair hyperintense focus adjacent to the fourth ventricle in the right cerebellum and a couple smaller foci in the periventricular deep white matter of the hemispheres consistent with chronic demyelinating plaque associated with multiple sclerosis.  When compared to the MRI dated 09/21/2017, there are no new lesions.  MRI of the cervical spine 09/21/2017 shows small foci within the spinal cord posteriorly adjacent to C2-C3, to the right adjacent to C4 and posteriorly to the right adjacent to T1.  All these foci were present on the 06/08/2016 MRI and were more apparent on the previous MRI than the current scan.  These are consistent with small chronic demyelinating plaques associated with multiple sclerosis   REVIEW OF SYSTEMS: Constitutional: No fevers, chills, sweats, or change in appetite.   She denies much fatigue. Eyes: No visual changes, double vision, eye pain Ear, nose and throat: No hearing loss, ear pain, nasal congestion, sore throat Cardiovascular: No chest pain, palpitations Respiratory:  No shortness of breath at rest or with exertion.   No wheezes GastrointestinaI: No nausea, vomiting, diarrhea, abdominal pain, fecal incontinence,    Has constipation Genitourinary:  No dysuria, urinary retention or frequency.  No nocturia.  Has had some yeast infections.  Musculoskeletal:  No neck pain, back pain Integumentary: No rash, pruritus, skin lesions Neurological: as above Psychiatric: Mood is doing well. Endocrine: No palpitations, diaphoresis, change in appetite, change in weigh or increased thirst Hematologic/Lymphatic:  No anemia, purpura, petechiae. Allergic/Immunologic: No itchy/runny eyes, nasal  congestion, recent allergic reactions, rashes  ALLERGIES: No Known Allergies  HOME MEDICATIONS:  Current Outpatient Medications:    Glatiramer Acetate 40 MG/ML SOSY, Inject 40 mg into the skin 3 (three) times a week., Disp: , Rfl:    diazepam (VALIUM) 5 MG tablet, Take one or two pills a day as needed (Patient not taking: Reported on 05/31/2023), Disp: 20 tablet, Rfl: 0   doxepin (SINEQUAN) 10 MG capsule, Take 1 capsule (10 mg total) by mouth at bedtime as needed. (Patient not taking: Reported on 05/31/2023), Disp: 90 capsule, Rfl: 3   natalizumab (TYSABRI) 300 MG/15ML injection, Inject into the vein. (Patient not taking: Reported on 05/31/2023), Disp: , Rfl:    NONFORMULARY OR COMPOUNDED ITEM, Pt takes supplements: Advanced TUDCA- 1 pill in pm BC-ATP- 1 pill in am and 1 pill in pm Biotoxin Binder 1 pill in am and 1 pill  in pm  Ct-Mineral 1 pill in pm (Patient not taking: Reported on 05/31/2023), Disp: , Rfl:   PAST MEDICAL HISTORY: Past Medical History:  Diagnosis Date   Multiple sclerosis (HCC)    Vaginal Pap smear, abnormal    Vision abnormalities     PAST SURGICAL HISTORY: Past Surgical History:  Procedure Laterality Date   CESAREAN SECTION N/A 05/21/2019   Procedure: CESAREAN SECTION;  Surgeon: Ranae Pila, MD;  Location: [facility] ORS;  Service: Obstetrics;  Laterality: N/A;   CESAREAN SECTION N/A 01/09/2021   Procedure: REPEAT CESAREAN SECTION EDC: 01-16-21 ALLERG: NKDA  PREVIOUS X 1;  Surgeon: Ranae Pila, MD;  Location: [facility] ORS;  Service: Obstetrics;  Laterality: N/A;   CYSTOSCOPY N/A 05/22/2019   Procedure: CYSTOSCOPY;  Surgeon: Ranae Pila, MD;  Location: [facility];  Service: Gynecology;  Laterality: N/A;   LAPAROTOMY N/A 05/22/2019   Procedure: LAPAROTOMY;  Surgeon: Ranae Pila, MD;  Location: [facility] ORS;  Service: Gynecology;  Laterality: N/A;   LEEP     suppose to have follow up of cervical cells in January   MYRINGOTOMY     WISDOM TOOTH  EXTRACTION      FAMILY HISTORY: Family History  Problem Relation Age of Onset   Heart disease Mother    Colon polyps Father    Healthy Father    Diabetes Maternal Grandmother    Leukemia Paternal Grandfather    Colon cancer Neg Hx    Esophageal cancer Neg Hx    Inflammatory bowel disease Neg Hx    Liver disease Neg Hx    Pancreatic cancer Neg Hx    Rectal cancer Neg Hx     SOCIAL HISTORY:  Social History   Socioeconomic History   Marital status: Married    Spouse name: Not on file   Number of children: Not on file   Years of education: Not on file   Highest education level: Not on file  Occupational History   Not on file  Tobacco Use   Smoking status: Never   Smokeless tobacco: Never  Vaping Use   Vaping status: Never Used  Substance and Sexual Activity   Alcohol use: Not Currently    Comment: occasional   Drug use: No   Sexual activity: Not Currently    Birth control/protection: None  Other Topics Concern   Not on file  Social History Narrative   Not on file   Social Drivers of Health   Financial Resource Strain: Not on file  Food Insecurity: No Food Insecurity (12/16/2020)   [facility]   Hunger Vital Sign    Worried About Running Out of Food in the Last Year: Never true    Ran Out of Food in the Last Year: Never true  Transportation Needs: Not on file  Physical Activity: Not on file  Stress: Not on file  Social Connections: Unknown (09/11/2021)   Received from [facility], [facility]   Social Network    Social Network: Not on file  Intimate Partner Violence: Unknown (08/05/2021)   Received from [facility], [facility]   HITS    Physically Hurt: Not on file    Insult or Talk Down To: Not on file    Threaten Physical Harm: Not on file    Scream or Curse: Not on file     PHYSICAL EXAM  BP 112/72 (BP Location: Right Arm, Patient Position: Sitting, Cuff Size: Normal)  Pulse 71   Ht 5\' 4"  (1.626 m)   Wt  177 lb 8 oz (80.5 kg)   BMI 30.47 kg/m    Body mass index is 30.47 kg/m.   General: The patient is well-developed and well-nourished and in no acute distress   HEENT:   Head is normocephalic and atraumatic.    Neurologic Exam  Mental status: She has a good affect.. The patient is alert and oriented x 3 at the time of the examination. The patient has apparent normal recent and remote memory, with an apparently normal attention span and concentration ability.   Speech is normal.  Cranial nerves: Extraocular movements are full.  Visual acuity and color vision is symmetric.  Facial strength and sensation is normal.  Trapezius strength is normal.  No obvious hearing deficits are noted.  Motor:  Muscle bulk is normal.   Tone is normal. Strength is  5 / 5 in all 4 extremities.   Sensory: Normal sensation to touch and vibration in the arms and legs.   Gait and station: Station is normal.  The gait is normal.  Tandem gait is mildly wide.  Romberg is negative.   Reflexes: Deep tendon reflexes are symmetric and normal bilaterally.        ASSESSMENT AND PLAN  Multiple sclerosis (HCC)  High risk medication use  Optic neuritis  [redacted] weeks gestation of pregnancy    1.   Continue glatiramer.  She would like to get back on Tysabri after delivery.  Her MS has been very stable while on Tysabri.    JCV Ab is negative.  2.  If insomnia worsens can take low dose melatonin 3.   Stay active and exercise as tolerated.   4.   Return in 5-6 months or sooner if there are new or worsening neurologic symptoms and depending on options of treatment.       Emmanual Gauthreaux A. Epimenio Foot, MD, PhD 05/31/2023, 8:39 PM   Certified in Neurology, Clinical Neurophysiology, Sleep Medicine, Pain Medicine and Neuroimaging  [facility] [address], [address] [place], [place] [address] [phone]

## 2023-06-02 LAB — OB RESULTS CONSOLE GC/CHLAMYDIA
Chlamydia: NEGATIVE
Chlamydia: NEGATIVE
Neisseria Gonorrhea: NEGATIVE
Neisseria Gonorrhea: NEGATIVE

## 2023-08-29 DIAGNOSIS — H10029 Other mucopurulent conjunctivitis, unspecified eye: Secondary | ICD-10-CM | POA: Insufficient documentation

## 2023-09-09 ENCOUNTER — Encounter (HOSPITAL_COMMUNITY): Payer: Self-pay | Admitting: *Deleted

## 2023-09-09 ENCOUNTER — Inpatient Hospital Stay (HOSPITAL_COMMUNITY)
Admission: AD | Admit: 2023-09-09 | Discharge: 2023-09-10 | Disposition: A | Discharge status: Home / Self Care | Attending: Obstetrics and Gynecology | Admitting: Obstetrics and Gynecology

## 2023-09-09 DIAGNOSIS — N858 Other specified noninflammatory disorders of uterus: Secondary | ICD-10-CM | POA: Diagnosis not present

## 2023-09-09 DIAGNOSIS — O26892 Other specified pregnancy related conditions, second trimester: Secondary | ICD-10-CM | POA: Insufficient documentation

## 2023-09-09 DIAGNOSIS — Z3A26 26 weeks gestation of pregnancy: Secondary | ICD-10-CM | POA: Diagnosis not present

## 2023-09-09 DIAGNOSIS — O4702 False labor before 37 completed weeks of gestation, second trimester: Secondary | ICD-10-CM | POA: Diagnosis not present

## 2023-09-09 DIAGNOSIS — R1030 Lower abdominal pain, unspecified: Secondary | ICD-10-CM | POA: Diagnosis not present

## 2023-09-09 DIAGNOSIS — O09522 Supervision of elderly multigravida, second trimester: Secondary | ICD-10-CM | POA: Diagnosis not present

## 2023-09-09 DIAGNOSIS — O99352 Diseases of the nervous system complicating pregnancy, second trimester: Secondary | ICD-10-CM | POA: Diagnosis not present

## 2023-09-09 DIAGNOSIS — O34219 Maternal care for unspecified type scar from previous cesarean delivery: Secondary | ICD-10-CM | POA: Insufficient documentation

## 2023-09-09 DIAGNOSIS — G35 Multiple sclerosis: Secondary | ICD-10-CM | POA: Insufficient documentation

## 2023-09-09 DIAGNOSIS — Z3689 Encounter for other specified antenatal screening: Secondary | ICD-10-CM | POA: Insufficient documentation

## 2023-09-09 HISTORY — DX: Irritable bowel syndrome, unspecified: K58.9

## 2023-09-09 HISTORY — DX: Anxiety disorder, unspecified: F41.9

## 2023-09-09 HISTORY — DX: Depression, unspecified: F32.A

## 2023-09-09 LAB — URINALYSIS, ROUTINE W REFLEX MICROSCOPIC
Bilirubin Urine: NEGATIVE
Glucose, UA: NEGATIVE mg/dL
Hgb urine dipstick: NEGATIVE
Ketones, ur: NEGATIVE mg/dL
Nitrite: NEGATIVE
Protein, ur: NEGATIVE mg/dL
Specific Gravity, Urine: 1.009 (ref 1.005–1.030)
pH: 6 (ref 5.0–8.0)

## 2023-09-09 NOTE — MAU Provider Note (Signed)
 MAU Provider Note  Chief Complaint: Contractions   Event Date/Time   First Provider Initiated Contact with Patient 09/09/23 2343      SUBJECTIVE HPI: Brittany Johnston is a 38 y.o. G3P2002 at [redacted]w[redacted]d by early ultrasound who presents to maternity admissions reporting contractions. Pregnancy c/b multiple sclerosis, history of prior C-section, hypothyroidism. Receives [facility].  Patient notes having lower abdominal cramping/mild contractions since Monday.  They have been intermittent throughout the week.  They were originally 2 out of 10 pain and bothersome.  Today they became 4 out of 10 and she was instructed to present to MAU for further evaluation.  She denies vaginal bleeding, loss of fluid, change in vaginal discharge, urinary symptoms.  Does note that she went to [employer] last week and walked much more than usual.  Notes positive fetal movement.  No history of preterm labor.  HPI  Past Medical History:  Diagnosis Date   Anxiety    Depression    Irritable bowel syndrome    Multiple sclerosis (HCC)    Multiple sclerosis (HCC)    Vaginal Pap smear, abnormal    Vision abnormalities    Past Surgical History:  Procedure Laterality Date   CESAREAN SECTION N/A 05/21/2019   Procedure: CESAREAN SECTION;  Surgeon: Concepcion Deck, MD;  Location: [facility] LD ORS;  Service: Obstetrics;  Laterality: N/A;   CESAREAN SECTION N/A 01/09/2021   Procedure: REPEAT CESAREAN SECTION EDC: 01-16-21 ALLERG: NKDA  PREVIOUS X 1;  Surgeon: Concepcion Deck, MD;  Location: [facility] LD ORS;  Service: Obstetrics;  Laterality: N/A;   CYSTOSCOPY N/A 05/22/2019   Procedure: CYSTOSCOPY;  Surgeon: Concepcion Deck, MD;  Location: [facility] LD ORS;  Service: Gynecology;  Laterality: N/A;   LAPAROTOMY N/A 05/22/2019   Procedure: LAPAROTOMY;  Surgeon: Concepcion Deck, MD;  Location: [facility] LD ORS;  Service: Gynecology;  Laterality: N/A;   LEEP     suppose to have follow up of cervical cells in January    MYRINGOTOMY     WISDOM TOOTH EXTRACTION     Social History   Socioeconomic History   Marital status: Married    Spouse name: Not on file   Number of children: Not on file   Years of education: Not on file   Highest education level: Not on file  Occupational History   Not on file  Tobacco Use   Smoking status: Never   Smokeless tobacco: Never  Vaping Use   Vaping status: Never Used  Substance and Sexual Activity   Alcohol use: Not Currently    Comment: occasional   Drug use: No   Sexual activity: Not Currently    Birth control/protection: None  Other Topics Concern   Not on file  Social History Narrative   Not on file   Social Drivers of Health   Financial Resource Strain: Low Risk  (08/29/2023)   Received from [facility]   Overall Financial Resource Strain (CARDIA)    Difficulty of Paying Living Expenses: Not hard at all  Food Insecurity: No Food Insecurity (08/29/2023)   Received from [facility]   Hunger Vital Sign    Worried About Running Out of Food in the Last Year: Never true    Ran Out of Food in the Last Year: Never true  Transportation Needs: No Transportation Needs (08/29/2023)   Received from [facility] - Transportation    Lack of Transportation (Medical): No    Lack of Transportation (  Non-Medical): No  Physical Activity: Not on file  Stress: Not on file  Social Connections: Unknown (09/11/2021)   Received from [facility], [facility]   Social Network    Social Network: Not on file  Intimate Partner Violence: Unknown (08/05/2021)   Received from [facility]   HITS    Physically Hurt: Not on file    Insult or Talk Down To: Not on file    Threaten Physical Harm: Not on file    Scream or Curse: Not on file   No current facility-administered medications on file prior to encounter.   Current Outpatient Medications on File Prior to Encounter  Medication Sig Dispense Refill   Glatiramer  Acetate 40 MG/ML SOSY  Inject 40 mg into the skin 3 (three) times a week.     levothyroxine (SYNTHROID) 50 MCG tablet Take 50 mcg by mouth daily before breakfast.     Prenatal Vit-Fe Fumarate-FA (PRENATAL VITAMINS PO) Take 1 tablet by mouth daily.     No Known Allergies  ROS:  Pertinent positives/negatives listed above.  I have reviewed patient's Past Medical Hx, Surgical Hx, Family Hx, Social Hx, medications and allergies.   Physical Exam  Patient Vitals for the past 24 hrs:  BP Temp Temp src Pulse Resp SpO2 Height Weight  09/10/23 0020 -- -- -- -- -- 98 % -- --  09/10/23 0015 -- -- -- -- -- 98 % -- --  09/10/23 0010 -- -- -- -- -- 98 % -- --  09/10/23 0005 -- -- -- -- -- 98 % -- --  09/10/23 0000 119/63 -- -- 76 -- 98 % -- --  09/09/23 2355 -- -- -- -- -- 99 % -- --  09/09/23 2350 -- -- -- -- -- 98 % -- --  09/09/23 2345 -- -- -- -- -- 98 % -- --  09/09/23 2340 -- -- -- -- -- 98 % -- --  09/09/23 2335 -- -- -- -- -- 98 % -- --  09/09/23 2334 -- -- -- -- -- 98 % -- --  09/09/23 2330 -- -- -- -- -- 98 % -- --  09/09/23 2325 -- -- -- -- -- 98 % -- --  09/09/23 2320 -- -- -- -- -- 98 % -- --  09/09/23 2257 (!) 119/57 97.6 F (36.4 C) Oral 88 12 -- 5' 4.5" (1.638 m) 90.3 kg   Constitutional: Well-developed, well-nourished female in no acute distress  Cardiovascular: normal rate Respiratory: normal effort GI: Abd soft, non-tender MS: Extremities nontender, no edema, normal ROM Neurologic: Alert and oriented x 4  GU: Neg CVAT  PELVIC EXAM: Cervix pink, visually closed/thick, without lesion, scant white creamy discharge, vaginal walls and external genitalia normal.  No bleeding  FHT:  Baseline 140, moderate variability, accelerations present, no decelerations Contractions: Uterine irritability  LAB RESULTS Results for orders placed or performed during the hospital encounter of 09/09/23 (from the past 24 hours)  Urinalysis, Routine w reflex microscopic -Urine, Clean Catch     Status: Abnormal    Collection Time: 09/09/23 11:13 PM  Result Value Ref Range   Color, Urine YELLOW YELLOW   APPearance HAZY (A) CLEAR   Specific Gravity, Urine 1.009 1.005 - 1.030   pH 6.0 5.0 - 8.0   Glucose, UA NEGATIVE NEGATIVE mg/dL   Hgb urine dipstick NEGATIVE NEGATIVE   Bilirubin Urine NEGATIVE NEGATIVE   Ketones, ur NEGATIVE NEGATIVE mg/dL   Protein, ur NEGATIVE NEGATIVE mg/dL   Nitrite NEGATIVE NEGATIVE  Leukocytes,Ua SMALL (A) NEGATIVE   RBC / HPF 0-5 0 - 5 RBC/hpf   WBC, UA 0-5 0 - 5 WBC/hpf   Bacteria, UA MANY (A) NONE SEEN   Squamous Epithelial / HPF 0-5 0 - 5 /HPF  Wet prep, genital     Status: Abnormal   Collection Time: 09/09/23 11:58 PM   Specimen: Cervix  Result Value Ref Range   Yeast Wet Prep HPF POC NONE SEEN NONE SEEN   Trich, Wet Prep NONE SEEN NONE SEEN   Clue Cells Wet Prep HPF POC NONE SEEN NONE SEEN   WBC, Wet Prep HPF POC >=10 (A) <10   Sperm NONE SEEN        IMAGING No results found.  MAU Management/MDM: Orders Placed This Encounter  Procedures   Wet prep, genital   Culture, OB Urine   Urinalysis, Routine w reflex microscopic -Urine, Clean Catch   Discharge patient    No orders of the defined types were placed in this encounter.    Available prenatal records reviewed.  Patient presents with intermittent contractions/cramping for past several days at [redacted]w[redacted]d.  Given cervix is visually closed, long, thick she is not in preterm labor.  Discussed with her that toco and her symptoms are more indicative of uterine irritability.  Discussed that this can be caused by increased activity, dehydration, UTI, vaginitis.  I discussed with her that her increase in activity last week from vacation may have started some of this irritability.  Did also obtain urinalysis and wet prep to rule out UTI and vaginitis.  Did offer patient Procardia for symptomatic management which she declined.  She also was amenable to receiving her urinalysis and wet prep results via MyChart given  the late hour of evaluation.  Ultimately yeast, trichomoniasis, BV, UTI were ruled out.  There was small leukocytes and many bacteria without WBCs on UA.  Subsequently a urine culture was sent.  FWB: Positive fetal movement, reactive NST for gestation  ASSESSMENT 1. Uterine irritability   2. NST (non-stress test) reactive   3. [redacted] weeks gestation of pregnancy     PLAN Discharge home with strict return precautions. Allergies as of 09/10/2023   No Known Allergies      Medication List     STOP taking these medications    diazepam  5 MG tablet Commonly known as: VALIUM    doxepin  10 MG capsule Commonly known as: SINEQUAN    NONFORMULARY OR COMPOUNDED ITEM   Tysabri  300 MG/15ML injection Generic drug: natalizumab        TAKE these medications    Glatiramer  Acetate 40 MG/ML Sosy Inject 40 mg into the skin 3 (three) times a week.   levothyroxine 50 MCG tablet Commonly known as: SYNTHROID Take 50 mcg by mouth daily before breakfast.   PRENATAL VITAMINS PO Take 1 tablet by mouth daily.         Authur Leghorn, MD OB Fellow 09/10/2023  12:47 AM

## 2023-09-09 NOTE — MAU Note (Signed)
 Pt says she started feeling UC's on Tuesday 2/10. Did not call Dr.  Horris Lynn have continued - Tonight called Dr - told to come here . Now 4/10 Feels baby moving

## 2023-09-09 NOTE — MAU Provider Note (Incomplete)
 MAU Provider Note  Chief Complaint: Contractions   Event Date/Time   First Provider Initiated Contact with Patient 09/09/23 2343      SUBJECTIVE HPI: Brittany Johnston is a 38 y.o. G3P2002 at [redacted]w[redacted]d by {Ob dating:14516} who presents to maternity admissions reporting ***. Pregnancy c/b ***. Receives [facility] with ***.  ***   HPI  Past Medical History:  Diagnosis Date  . Anxiety   . Depression   . Irritable bowel syndrome   . Multiple sclerosis (HCC)   . Multiple sclerosis (HCC)   . Vaginal Pap smear, abnormal   . Vision abnormalities    Past Surgical History:  Procedure Laterality Date  . CESAREAN SECTION N/A 05/21/2019   Procedure: CESAREAN SECTION;  Surgeon: Concepcion Deck, MD;  Location: [facility] LD ORS;  Service: Obstetrics;  Laterality: N/A;  . CESAREAN SECTION N/A 01/09/2021   Procedure: REPEAT CESAREAN SECTION EDC: 01-16-21 ALLERG: NKDA  PREVIOUS X 1;  Surgeon: Concepcion Deck, MD;  Location: [facility] LD ORS;  Service: Obstetrics;  Laterality: N/A;  . CYSTOSCOPY N/A 05/22/2019   Procedure: CYSTOSCOPY;  Surgeon: Concepcion Deck, MD;  Location: [facility] LD ORS;  Service: Gynecology;  Laterality: N/A;  . LAPAROTOMY N/A 05/22/2019   Procedure: LAPAROTOMY;  Surgeon: Concepcion Deck, MD;  Location: [facility] LD ORS;  Service: Gynecology;  Laterality: N/A;  . LEEP     suppose to have follow up of cervical cells in January  . MYRINGOTOMY    . WISDOM TOOTH EXTRACTION     Social History   Socioeconomic History  . Marital status: Married    Spouse name: Not on file  . Number of children: Not on file  . Years of education: Not on file  . Highest education level: Not on file  Occupational History  . Not on file  Tobacco Use  . Smoking status: Never  . Smokeless tobacco: Never  Vaping Use  . Vaping status: Never Used  Substance and Sexual Activity  . Alcohol use: Not Currently    Comment: occasional  . Drug use: No  . Sexual activity: Not Currently    Birth control/protection:  None  Other Topics Concern  . Not on file  Social History Narrative  . Not on file   Social Drivers of Health   Financial Resource Strain: Low Risk  (08/29/2023)   Received from [facility]   Overall Financial Resource Strain (CARDIA)   . Difficulty of Paying Living Expenses: Not hard at all  Food Insecurity: No Food Insecurity (08/29/2023)   Received [facility]   Hunger Vital Sign   . Worried About Programme researcher, broadcasting/film/video in the Last Year: Never true   . Ran Out of Food in the Last Year: Never true  Transportation Needs: No Transportation Needs (08/29/2023)   Received from [facility] - Transportation   . Lack of Transportation (Medical): No   . Lack of Transportation (Non-Medical): No  Physical Activity: Not on file  Stress: Not on file  Social Connections: Unknown (09/11/2021)   [facility]   Social Network   . Social Network: Not on file  Intimate Partner Violence: Unknown (08/05/2021)   [facility] from [facility]   HITS   . Physically Hurt: Not on file   . Insult or Talk Down To: Not on file   . Threaten Physical Harm: Not on file   . Scream or Curse: Not on file   No  current facility-administered medications on file prior to encounter.   Current Outpatient Medications on File Prior to Encounter  Medication Sig Dispense Refill  . levothyroxine (SYNTHROID) 50 MCG tablet Take 50 mcg by mouth daily before breakfast.    . Prenatal Vit-Fe Fumarate-FA (PRENATAL VITAMINS PO) Take 1 tablet by mouth daily.    . diazepam  (VALIUM ) 5 MG tablet Take one or two pills a day as needed (Patient not taking: Reported on 05/31/2023) 20 tablet 0  . doxepin  (SINEQUAN ) 10 MG capsule Take 1 capsule (10 mg total) by mouth at bedtime as needed. (Patient not taking: Reported on 05/31/2023) 90 capsule 3  . Glatiramer  Acetate 40 MG/ML SOSY Inject 40 mg into the skin 3 (three) times a week.    . natalizumab  (TYSABRI ) 300 MG/15ML injection  Inject into the vein. (Patient not taking: Reported on 05/31/2023)    . NONFORMULARY OR COMPOUNDED ITEM Pt takes supplements: Advanced TUDCA- 1 pill in pm BC-ATP- 1 pill in am and 1 pill in pm Biotoxin Binder 1 pill in am and 1 pill in pm  Ct-Mineral 1 pill in pm (Patient not taking: Reported on 05/31/2023)     No Known Allergies  ROS:  Pertinent positives/negatives listed above.  I have reviewed patient's Past Medical Hx, Surgical Hx, Family Hx, Social Hx, medications and allergies.   Physical Exam  Patient Vitals for the past 24 hrs:  BP Temp Temp src Pulse Resp Height Weight  09/09/23 2257 (!) 119/57 97.6 F (36.4 C) Oral 88 12 5' 4.5" (1.638 m) 90.3 kg   Constitutional: Well-developed, well-nourished female in no acute distress  Cardiovascular: normal rate Respiratory: normal effort GI: Abd soft, non-tender MS: Extremities nontender, no edema, normal ROM Neurologic: Alert and oriented x 4  GU: Neg CVAT.  PELVIC EXAM: Cervix pink, visually closed, without lesion, scant white creamy discharge, vaginal walls and external genitalia normal Bimanual exam: Cervix 0/long/high, firm, anterior, neg CMT, uterus nontender, nonenlarged, adnexa without tenderness, enlargement, or mass  FHT:  Baseline *** , moderate variability, accelerations present, no decelerations Contractions: q *** mins  LAB RESULTS Results for orders placed or performed during the hospital encounter of 09/09/23 (from the past 24 hours)  Urinalysis, Routine w reflex microscopic -Urine, Clean Catch     Status: Abnormal   Collection Time: 09/09/23 11:13 PM  Result Value Ref Range   Color, Urine YELLOW YELLOW   APPearance HAZY (A) CLEAR   Specific Gravity, Urine 1.009 1.005 - 1.030   pH 6.0 5.0 - 8.0   Glucose, UA NEGATIVE NEGATIVE mg/dL   Hgb urine dipstick NEGATIVE NEGATIVE   Bilirubin Urine NEGATIVE NEGATIVE   Ketones, ur NEGATIVE NEGATIVE mg/dL   Protein, ur NEGATIVE NEGATIVE mg/dL   Nitrite NEGATIVE  NEGATIVE   Leukocytes,Ua SMALL (A) NEGATIVE   RBC / HPF 0-5 0 - 5 RBC/hpf   WBC, UA 0-5 0 - 5 WBC/hpf   Bacteria, UA MANY (A) NONE SEEN   Squamous Epithelial / HPF 0-5 0 - 5 /HPF       IMAGING No results found.  MAU Management/MDM: Orders Placed This Encounter  Procedures  . Urinalysis, Routine w reflex microscopic -Urine, Clean Catch    No orders of the defined types were placed in this encounter.    Available prenatal records reviewed.  ***  ASSESSMENT No diagnosis found.  PLAN Discharge home with strict return precautions. Allergies as of 09/09/2023   No Known Allergies   Med Rec must be completed prior to using  this [facility]***        Authur Leghorn, MD OB Fellow 09/09/2023  11:45 PM

## 2023-09-10 DIAGNOSIS — Z3A26 26 weeks gestation of pregnancy: Secondary | ICD-10-CM

## 2023-09-10 DIAGNOSIS — O26892 Other specified pregnancy related conditions, second trimester: Secondary | ICD-10-CM

## 2023-09-10 DIAGNOSIS — N858 Other specified noninflammatory disorders of uterus: Secondary | ICD-10-CM

## 2023-09-10 DIAGNOSIS — Z3689 Encounter for other specified antenatal screening: Secondary | ICD-10-CM

## 2023-09-10 LAB — WET PREP, GENITAL
Clue Cells Wet Prep HPF POC: NONE SEEN
Sperm: NONE SEEN
Trich, Wet Prep: NONE SEEN
WBC, Wet Prep HPF POC: >=10 — AB (ref <10)
Yeast Wet Prep HPF POC: NONE SEEN

## 2023-09-11 LAB — CULTURE, OB URINE

## 2023-09-14 ENCOUNTER — Encounter: Payer: Self-pay | Admitting: Neurology

## 2023-09-29 ENCOUNTER — Inpatient Hospital Stay (HOSPITAL_COMMUNITY)
Admission: AD | Admit: 2023-09-29 | Discharge: 2023-09-29 | Disposition: A | Discharge status: Home / Self Care | Attending: Obstetrics and Gynecology | Admitting: Obstetrics and Gynecology

## 2023-09-29 ENCOUNTER — Encounter (HOSPITAL_COMMUNITY): Payer: Self-pay | Admitting: Obstetrics and Gynecology

## 2023-09-29 DIAGNOSIS — Z3A29 29 weeks gestation of pregnancy: Secondary | ICD-10-CM | POA: Diagnosis not present

## 2023-09-29 DIAGNOSIS — O9A213 Injury, poisoning and certain other consequences of external causes complicating pregnancy, third trimester: Secondary | ICD-10-CM | POA: Diagnosis present

## 2023-09-29 DIAGNOSIS — Z3689 Encounter for other specified antenatal screening: Secondary | ICD-10-CM | POA: Diagnosis not present

## 2023-09-29 DIAGNOSIS — Z711 Person with feared health complaint in whom no diagnosis is made: Secondary | ICD-10-CM

## 2023-09-29 NOTE — MAU Note (Signed)
 Brittany Johnston is a 38 y.o. at 101w4d here in MAU reporting her 38yo was going to sit on pt's lap and she blopped down on her upper abdomen which caused pain. No pain now.  Pt called on call person and was told to come in out of caution. Reports good FM and no LOF or VB.   LMP: n/a Onset of complaint: 2100 Pain score: 0 Vitals:   09/29/23 2209 09/29/23 2210  BP:  (!) 112/58  Pulse: 87   Resp: 16   Temp: 97.9 F (36.6 C)      FHT: 158  Lab orders placed from triage: none

## 2023-09-29 NOTE — MAU Provider Note (Signed)
 Chief Complaint:  Abdominal Injury   HPI   Brittany Johnston is a 38 y.o. G3P2002 at [redacted]w[redacted]d who presents to maternity admissions reporting she was laying in her bed watching a movie when her 38 yo sat on her lap and bumped her abdomen ~ 9 PM this evening. She denies any VB, LOF, CTX, and reports good FM's .   Pregnancy Course: [facility]  ( Prenatal Records reviewed)  Past Medical History:  Diagnosis Date   Anxiety    Depression    Irritable bowel syndrome    Multiple sclerosis (HCC)    Multiple sclerosis (HCC)    Vaginal Pap smear, abnormal    Vision abnormalities    OB History  Gravida Para Term Preterm AB Living  3 2 2   2   SAB IAB Ectopic Multiple Live Births     0 2    # Outcome Date GA Lbr Len/2nd Weight Sex Type Anes PTL Lv  3 Current           2 Term 01/09/21 [redacted]w[redacted]d  3965 g M CS-LTranv Spinal  LIV  1 Term 05/21/19 [redacted]w[redacted]d 03:34 / 03:56 3705 g F CS-LTranv EPI  LIV   Past Surgical History:  Procedure Laterality Date   CESAREAN SECTION N/A 05/21/2019   Procedure: CESAREAN SECTION;  Surgeon: Concepcion Deck, MD;  Location: [facility] LD ORS;  Service: Obstetrics;  Laterality: N/A;   CESAREAN SECTION N/A 01/09/2021   Procedure: REPEAT CESAREAN SECTION EDC: 01-16-21 ALLERG: NKDA  PREVIOUS X 1;  Surgeon: Concepcion Deck, MD;  Location: [facility] LD ORS;  Service: Obstetrics;  Laterality: N/A;   CYSTOSCOPY N/A 05/22/2019   Procedure: CYSTOSCOPY;  Surgeon: Concepcion Deck, MD;  Location: [facility] LD ORS;  Service: Gynecology;  Laterality: N/A;   LAPAROTOMY N/A 05/22/2019   Procedure: LAPAROTOMY;  Surgeon: Concepcion Deck, MD;  Location: [facility] LD ORS;  Service: Gynecology;  Laterality: N/A;   LEEP     suppose to have follow up of cervical cells in January   MYRINGOTOMY     WISDOM TOOTH EXTRACTION     Family History  Problem Relation Age of Onset   Heart disease Mother    Colon polyps Father    Healthy Father    Diabetes Maternal Grandmother    Leukemia Paternal  Grandfather    Colon cancer Neg Hx    Esophageal cancer Neg Hx    Inflammatory bowel disease Neg Hx    Liver disease Neg Hx    Pancreatic cancer Neg Hx    Rectal cancer Neg Hx    Social History   Tobacco Use   Smoking status: Never   Smokeless tobacco: Never  Vaping Use   Vaping status: Never Used  Substance Use Topics   Alcohol use: Not Currently    Comment: occasional   Drug use: No   No Known Allergies Medications Prior to Admission  Medication Sig Dispense Refill Last Dose/Taking   Glatiramer  Acetate 40 MG/ML SOSY Inject 40 mg into the skin 3 (three) times a week.   09/28/2023   levothyroxine (SYNTHROID) 50 MCG tablet Take 50 mcg by mouth daily before breakfast.   09/29/2023 Morning   Prenatal Vit-Fe Fumarate-FA (PRENATAL VITAMINS PO) Take 1 tablet by mouth daily.   09/29/2023    I have reviewed patient's Past Medical Hx, Surgical Hx, Family Hx, Social Hx, medications and allergies.   ROS  Pertinent items noted in HPI and remainder of comprehensive ROS otherwise negative.  PHYSICAL EXAM   Patient Vitals for the past 24 hrs:  BP Temp Pulse Resp SpO2  09/29/23 2237 -- -- -- -- 99 %  09/29/23 2222 96/79 -- 94 -- 98 %  09/29/23 2210 (!) 112/58 -- -- -- --  09/29/23 2209 -- 97.9 F (36.6 C) 87 16 --    Constitutional: Well-developed, well-nourished female in no acute distress.  Cardiovascular: normal rate & rhythm, warm and well-perfused Respiratory: normal effort, no problems with respiration noted GI: Abd soft, non-tender, gravid MS: Extremities nontender, no edema, normal ROM Neurologic: Alert and oriented x 4.  GU: no CVA tenderness Pelvic: Deferred     Fetal Tracing: Cat 1 reactive Baseline: 130 Variability: moderate  Accelerations: present Decelerations: absent Toco: no ctx's     MDM & MAU COURSE  MDM:  Moderate - NST  Cat 1 reactive ( No CTX's) -Discussed with OB Attending (Dr Asencion Blacksmith) No acute abdominal trauma with reactive FHRT and no ctx's Plan  for discharge home with precautions.   I have reviewed the patient chart and performed the physical exam . I have ordered & interpreted the lab results and reviewed and interpreted the NST Medications ordered as stated below.  A/P as described below.  Counseling and education provided and patient agreeable  with plan as described below. Verbalized understanding.    ASSESSMENT   1. [redacted] weeks gestation of pregnancy   2. Physically well but worried   3. NST (non-stress test) reactive on fetal surveillance     PLAN  Discharge home in stable condition with return precautions.   Follow up with OB as scheduled   See AVS for full description of information given to the patient including both verbal and written. Patient verbalized understanding and agrees with the plan as described above.      Allergies as of 09/29/2023   No Known Allergies      Medication List     TAKE these medications    Glatiramer  Acetate 40 MG/ML Sosy Inject 40 mg into the skin 3 (three) times a week.   levothyroxine 50 MCG tablet Commonly known as: SYNTHROID Take 50 mcg by mouth daily before breakfast.   PRENATAL VITAMINS PO Take 1 tablet by mouth daily.        [name], MSN, [facility], [facility] [employer]

## 2023-10-14 ENCOUNTER — Inpatient Hospital Stay (HOSPITAL_COMMUNITY)
Admission: AD | Admit: 2023-10-14 | Discharge: 2023-10-14 | Disposition: A | Discharge status: Home / Self Care | Attending: Obstetrics & Gynecology | Admitting: Obstetrics & Gynecology

## 2023-10-14 ENCOUNTER — Other Ambulatory Visit: Payer: Self-pay

## 2023-10-14 ENCOUNTER — Encounter (HOSPITAL_COMMUNITY): Payer: Self-pay | Admitting: Obstetrics and Gynecology

## 2023-10-14 DIAGNOSIS — Z3689 Encounter for other specified antenatal screening: Secondary | ICD-10-CM

## 2023-10-14 DIAGNOSIS — O34211 Maternal care for low transverse scar from previous cesarean delivery: Secondary | ICD-10-CM | POA: Insufficient documentation

## 2023-10-14 DIAGNOSIS — G35 Multiple sclerosis: Secondary | ICD-10-CM | POA: Insufficient documentation

## 2023-10-14 DIAGNOSIS — D509 Iron deficiency anemia, unspecified: Secondary | ICD-10-CM | POA: Diagnosis not present

## 2023-10-14 DIAGNOSIS — N858 Other specified noninflammatory disorders of uterus: Secondary | ICD-10-CM | POA: Diagnosis not present

## 2023-10-14 DIAGNOSIS — O99353 Diseases of the nervous system complicating pregnancy, third trimester: Secondary | ICD-10-CM | POA: Insufficient documentation

## 2023-10-14 DIAGNOSIS — O99013 Anemia complicating pregnancy, third trimester: Secondary | ICD-10-CM | POA: Insufficient documentation

## 2023-10-14 DIAGNOSIS — M546 Pain in thoracic spine: Secondary | ICD-10-CM | POA: Insufficient documentation

## 2023-10-14 DIAGNOSIS — M549 Dorsalgia, unspecified: Secondary | ICD-10-CM | POA: Diagnosis not present

## 2023-10-14 DIAGNOSIS — O99891 Other specified diseases and conditions complicating pregnancy: Secondary | ICD-10-CM

## 2023-10-14 DIAGNOSIS — R109 Unspecified abdominal pain: Secondary | ICD-10-CM | POA: Diagnosis present

## 2023-10-14 DIAGNOSIS — Z3A31 31 weeks gestation of pregnancy: Secondary | ICD-10-CM | POA: Diagnosis not present

## 2023-10-14 LAB — CBC
HCT: 33 % — ABNORMAL LOW (ref 36.0–46.0)
Hemoglobin: 10.9 g/dL — ABNORMAL LOW (ref 12.0–15.0)
MCH: 30.1 pg (ref 26.0–34.0)
MCHC: 33 g/dL (ref 30.0–36.0)
MCV: 91.2 fL (ref 80.0–100.0)
Platelets: 169 10*3/uL (ref 150–400)
RBC: 3.62 MIL/uL — ABNORMAL LOW (ref 3.87–5.11)
RDW: 15.1 % (ref 11.5–15.5)
WBC: 9.5 10*3/uL (ref 4.0–10.5)
nRBC: 0 % (ref 0.0–0.2)

## 2023-10-14 LAB — COMPREHENSIVE METABOLIC PANEL WITH GFR
ALT: 16 U/L (ref 0–44)
AST: 23 U/L (ref 15–41)
Albumin: 2.6 g/dL — ABNORMAL LOW (ref 3.5–5.0)
Alkaline Phosphatase: 103 U/L (ref 38–126)
Anion gap: 12 (ref 5–15)
BUN: 7 mg/dL (ref 6–20)
CO2: 19 mmol/L — ABNORMAL LOW (ref 22–32)
Calcium: 8.6 mg/dL — ABNORMAL LOW (ref 8.9–10.3)
Chloride: 100 mmol/L (ref 98–111)
Creatinine, Ser: 0.58 mg/dL (ref 0.44–1.00)
GFR, Estimated: >60 mL/min (ref >60)
Glucose, Bld: 103 mg/dL — ABNORMAL HIGH (ref 70–99)
Potassium: 3.6 mmol/L (ref 3.5–5.1)
Sodium: 131 mmol/L — ABNORMAL LOW (ref 135–145)
Total Bilirubin: 0.7 mg/dL (ref 0.0–1.2)
Total Protein: 5.8 g/dL — ABNORMAL LOW (ref 6.5–8.1)

## 2023-10-14 LAB — URINALYSIS, ROUTINE W REFLEX MICROSCOPIC
Bilirubin Urine: NEGATIVE
Glucose, UA: NEGATIVE mg/dL
Hgb urine dipstick: NEGATIVE
Ketones, ur: 20 mg/dL — AB
Leukocytes,Ua: NEGATIVE
Nitrite: NEGATIVE
Protein, ur: NEGATIVE mg/dL
Specific Gravity, Urine: 1.017 (ref 1.005–1.030)
pH: 6 (ref 5.0–8.0)

## 2023-10-14 MED ORDER — CYCLOBENZAPRINE HCL 5 MG PO TABS
10.0000 mg | ORAL_TABLET | Freq: Once | ORAL | Status: AC
  Administered 2023-10-14: 10 mg via ORAL
  Filled 2023-10-14: qty 2

## 2023-10-14 MED ORDER — ACETAMINOPHEN 500 MG PO TABS
1000.0000 mg | ORAL_TABLET | Freq: Once | ORAL | Status: AC
  Administered 2023-10-14: 1000 mg via ORAL
  Filled 2023-10-14: qty 2

## 2023-10-14 MED ORDER — CYCLOBENZAPRINE HCL 10 MG PO TABS: 10.0000 mg | ORAL_TABLET | Freq: Three times a day (TID) | ORAL | 0 refills | Status: DC | PRN

## 2023-10-14 NOTE — MAU Provider Note (Signed)
 Chief Complaint:  Abdominal Pain   HPI    Brittany Johnston is a 38 y.o. G3P2002 at [redacted]w[redacted]d who presents to maternity admissions reporting upper thoracic back pain and feeling achy most of the day. She denies fever or chills. Reported some mild difficulty with urination and an ongoing problem with constipation during this pregnancy. She hasn't taken  anything for her pain. She denies any OB c/o no c/o LOF, VB, CTX and reports good FM's  Pregnancy Course: [facility]  Past Medical History:  Diagnosis Date   Anxiety    Depression    Irritable bowel syndrome    Multiple sclerosis (HCC)    Multiple sclerosis (HCC)    Vaginal Pap smear, abnormal    Vision abnormalities    OB History  Gravida Para Term Preterm AB Living  3 2 2   2   SAB IAB Ectopic Multiple Live Births     0 2    # Outcome Date GA Lbr Len/2nd Weight Sex Type Anes PTL Lv  3 Current           2 Term 01/09/21 [redacted]w[redacted]d  3965 g M CS-LTranv Spinal  LIV  1 Term 05/21/19 [redacted]w[redacted]d 03:34 / 03:56 3705 g F CS-LTranv EPI  LIV   Past Surgical History:  Procedure Laterality Date   CESAREAN SECTION N/A 05/21/2019   Procedure: CESAREAN SECTION;  Surgeon: Concepcion Deck, MD;  Location: [facility];  Service: Obstetrics;  Laterality: N/A;   CESAREAN SECTION N/A 01/09/2021   Procedure: REPEAT CESAREAN SECTION EDC: 01-16-21 ALLERG: NKDA  PREVIOUS X 1;  Surgeon: Concepcion Deck, MD;  Location: [facility] LD ORS;  Service: Obstetrics;  Laterality: N/A;   CYSTOSCOPY N/A 05/22/2019   Procedure: CYSTOSCOPY;  Surgeon: Concepcion Deck, MD;  Location: [facility] ORS;  Service: Gynecology;  Laterality: N/A;   LAPAROTOMY N/A 05/22/2019   Procedure: LAPAROTOMY;  Surgeon: Concepcion Deck, MD;  Location: [facility] ORS;  Service: Gynecology;  Laterality: N/A;   LEEP     suppose to have follow up of cervical cells in January   MYRINGOTOMY     WISDOM TOOTH EXTRACTION     Family History  Problem Relation Age of Onset   Heart disease Mother     Colon polyps Father    Healthy Father    Diabetes Maternal Grandmother    Leukemia Paternal Grandfather    Colon cancer Neg Hx    Esophageal cancer Neg Hx    Inflammatory bowel disease Neg Hx    Liver disease Neg Hx    Pancreatic cancer Neg Hx    Rectal cancer Neg Hx    Social History   Tobacco Use   Smoking status: Never   Smokeless tobacco: Never  Vaping Use   Vaping status: Never Used  Substance Use Topics   Alcohol use: Not Currently    Comment: occasional   Drug use: No   No Known Allergies Medications Prior to Admission  Medication Sig Dispense Refill Last Dose/Taking   ferrous sulfate 324 MG TBEC Take 324 mg by mouth.   10/13/2023 Evening   Glatiramer  Acetate 40 MG/ML SOSY Inject 40 mg into the skin 3 (three) times a week.   Past Week   levothyroxine (SYNTHROID) 50 MCG tablet Take 50 mcg by mouth daily before breakfast.   10/14/2023 Morning   Prenatal Vit-Fe Fumarate-FA (PRENATAL VITAMINS PO) Take 1 tablet by mouth daily.   10/13/2023 Evening    I have reviewed patient's  Past Medical Hx, Surgical Hx, Family Hx, Social Hx, medications and allergies.   ROS  Pertinent items noted in HPI and remainder of comprehensive ROS otherwise negative.   PHYSICAL EXAM  Patient Vitals for the past 24 hrs:  BP Temp Pulse Resp SpO2 Weight  10/14/23 1837 121/65 97.7 F (36.5 C) (!) 129 20 100 % 92.8 kg    Constitutional: Well-developed, well-nourished female in no acute distress.  Cardiovascular: normal rate & rhythm, warm and well-perfused Respiratory: normal effort, no problems with respiration noted GI: Abd soft, non-tender, no pain on palpation MS: Extremities nontender, no edema, pain in upper thoracic area of her back palpated , no flank pain illicited Neurologic: Alert and oriented x 4.  GU: no CVA tenderness Pelvic: deferred     Fetal Tracing: Cat 1, reactive Baseline: 160 Variability: moderate  Accelerations: present Decelerations:absent Toco: no ctx    Labs: Results for orders placed or performed during the hospital encounter of 10/14/23 (from the past 24 hours)  Urinalysis, Routine w reflex microscopic -Urine, Clean Catch     Status: Abnormal   Collection Time: 10/14/23  7:07 PM  Result Value Ref Range   Color, Urine YELLOW YELLOW   APPearance HAZY (A) CLEAR   Specific Gravity, Urine 1.017 1.005 - 1.030   pH 6.0 5.0 - 8.0   Glucose, UA NEGATIVE NEGATIVE mg/dL   Hgb urine dipstick NEGATIVE NEGATIVE   Bilirubin Urine NEGATIVE NEGATIVE   Ketones, ur 20 (A) NEGATIVE mg/dL   Protein, ur NEGATIVE NEGATIVE mg/dL   Nitrite NEGATIVE NEGATIVE   Leukocytes,Ua NEGATIVE NEGATIVE  CBC     Status: Abnormal   Collection Time: 10/14/23  7:59 PM  Result Value Ref Range   WBC 9.5 4.0 - 10.5 K/uL   RBC 3.62 (L) 3.87 - 5.11 MIL/uL   Hemoglobin 10.9 (L) 12.0 - 15.0 g/dL   HCT 40.9 (L) 81.1 - 91.4 %   MCV 91.2 80.0 - 100.0 fL   MCH 30.1 26.0 - 34.0 pg   MCHC 33.0 30.0 - 36.0 g/dL   RDW 78.2 95.6 - 21.3 %   Platelets 169 150 - 400 K/uL   nRBC 0.0 0.0 - 0.2 %  Comprehensive metabolic panel     Status: Abnormal   Collection Time: 10/14/23  7:59 PM  Result Value Ref Range   Sodium 131 (L) 135 - 145 mmol/L   Potassium 3.6 3.5 - 5.1 mmol/L   Chloride 100 98 - 111 mmol/L   CO2 19 (L) 22 - 32 mmol/L   Glucose, Bld 103 (H) 70 - 99 mg/dL   BUN 7 6 - 20 mg/dL   Creatinine, Ser 0.86 0.44 - 1.00 mg/dL   Calcium 8.6 (L) 8.9 - 10.3 mg/dL   Total Protein 5.8 (L) 6.5 - 8.1 g/dL   Albumin  2.6 (L) 3.5 - 5.0 g/dL   AST 23 15 - 41 U/L   ALT 16 0 - 44 U/L   Alkaline Phosphatase 103 38 - 126 U/L   Total Bilirubin 0.7 0.0 - 1.2 mg/dL   GFR, Estimated >57 >84 mL/min   Anion gap 12 5 - 15    Imaging:  No results found.  MDM & MAU COURSE  MDM:  HIGH  Back pain - CBC, CMP, U/A to R/O infectious process - NST for GA and fetal well being - CBC c/w anemia ( Patient reports she is taking daily iron supplements) - Tylenol  and Flexeril  administered for  likely  muscular skeletal pain in pregnancy  MAU Course: Orders Placed This Encounter  Procedures   Urinalysis, Routine w reflex microscopic -Urine, Clean Catch   CBC   Comprehensive metabolic panel   Discharge patient Discharge disposition: 01-Home or Self Care; Discharge patient date: 10/14/2023   Meds ordered this encounter  Medications   acetaminophen  (TYLENOL ) tablet 1,000 mg   cyclobenzaprine  (FLEXERIL ) tablet 10 mg   cyclobenzaprine  (FLEXERIL ) 10 MG tablet    Sig: Take 1 tablet (10 mg total) by mouth 3 (three) times daily as needed for muscle spasms.    Dispense:  30 tablet    Refill:  0    Supervising Provider:   PRATT, TANYA S [[id number]]    I have reviewed the patient chart and performed the physical exam . I have ordered & interpreted the lab results and reviewed and interpreted the NST Medications ordered as stated below.  A/P as described below.  Counseling and education provided and patient agreeable  with plan as described below. Verbalized understanding.    ASSESSMENT   1. Back pain affecting pregnancy in third trimester   2. [redacted] weeks gestation of pregnancy   3. Iron deficiency anemia, unspecified iron deficiency anemia type   4. NST (non-stress test) reactive on fetal surveillance     PLAN  Discharge home in stable condition with return precautions.   Follow up with OB   See AVS for full description of information given to the patient including both verbal and written. Patient verbalized understanding and agrees with the plan as described above.      Allergies as of 10/14/2023   No Known Allergies      Medication List     TAKE these medications    cyclobenzaprine  10 MG tablet Commonly known as: FLEXERIL  Take 1 tablet (10 mg total) by mouth 3 (three) times daily as needed for muscle spasms.   ferrous sulfate 324 MG Tbec Take 324 mg by mouth.   Glatiramer  Acetate 40 MG/ML Sosy Inject 40 mg into the skin 3 (three) times a week.   levothyroxine 50  MCG tablet Commonly known as: SYNTHROID Take 50 mcg by mouth daily before breakfast.   PRENATAL VITAMINS PO Take 1 tablet by mouth daily.        [name], MSN, [facility], [facility] [employer]

## 2023-10-14 NOTE — MAU Note (Signed)
.  Brittany Johnston is a 38 y.o. at [redacted]w[redacted]d here in MAU reporting: feeling achy all day. Patient reports chills and pain in her left side. Patient reports it feels like there are knives in her back.  Patient reports trouble peeing  Onset of complaint: today Pain score: 8/10 body pain  There were no vitals filed for this visit.    Lab orders placed from triage:   ua

## 2023-10-14 NOTE — Discharge Instructions (Signed)
Follow up with your OB.

## 2023-11-01 ENCOUNTER — Encounter: Payer: Self-pay | Admitting: Neurology

## 2023-11-01 ENCOUNTER — Telehealth: Payer: Self-pay | Admitting: *Deleted

## 2023-11-01 ENCOUNTER — Telehealth: Payer: Self-pay

## 2023-11-01 ENCOUNTER — Ambulatory Visit: Payer: No Typology Code available for payment source | Admitting: Neurology

## 2023-11-01 VITALS — BP 106/69 | HR 85 | Ht 64.5 in | Wt 203.5 lb

## 2023-11-01 DIAGNOSIS — G35 Multiple sclerosis: Secondary | ICD-10-CM | POA: Diagnosis not present

## 2023-11-01 DIAGNOSIS — R252 Cramp and spasm: Secondary | ICD-10-CM

## 2023-11-01 DIAGNOSIS — O99891 Other specified diseases and conditions complicating pregnancy: Secondary | ICD-10-CM

## 2023-11-01 DIAGNOSIS — Z79899 Other long term (current) drug therapy: Secondary | ICD-10-CM | POA: Diagnosis not present

## 2023-11-01 DIAGNOSIS — F09 Unspecified mental disorder due to known physiological condition: Secondary | ICD-10-CM

## 2023-11-01 DIAGNOSIS — Z3490 Encounter for supervision of normal pregnancy, unspecified, unspecified trimester: Secondary | ICD-10-CM

## 2023-11-01 DIAGNOSIS — G4700 Insomnia, unspecified: Secondary | ICD-10-CM

## 2023-11-01 NOTE — Progress Notes (Signed)
 [facility]  PATIENT: Brittany Johnston DOB: [birth year]  REFERRING DOCTOR OR PCP:  Ozell Mirza [facility])      _________________________________   HISTORICAL  CHIEF COMPLAINT:  Chief Complaint  Patient presents with   Follow-up    Pt in room 11.alone. Here for MS follow up. On glatiramer .  Pt reports cramps in calf in middle of night, toes are locked up at night as well, unable to move them.     HISTORY OF PRESENT ILLNESS:  Brittany Johnston is a 38 y.o. woman with relapsing remitting MS.     Update 11/01/2023 She is [redacted] weeks pregnant (will have C-section 12/05/2023).    She is on glatiramer .  Shots are often painful so she does 1-2 times most weeks.    Her last Tysabri  was in [facility] 2024.    She is not planning on breastfeeding.      She was on Tysabri .   SHe tolerates it well and has no exacerbation.  . She is JCV antibody negative.     She feels her MS is stable.  No exacerbation or new symptom.     Gait is doing well.   Balance is good but she holds bannister for safety.. She feels unsafe going downstairs fast   Limbs are strong but she gets leg cramps frequently.  Toes sometimes cramp up.   No dysesthesias.   Bladder function is fine.  Vision is normal and symmetric.  Color vision is symmetric.  She notes mild reduced focus and STM,   She sometimes has word finding errors.  She notes some fatigue. She has insomnia most nights (4 hours)- sleep onset is fine but sleep maintenance sometimes poor.  .   She denies depression     She has a daughter is 4 and son is 2, almost 3.   Due 12/05/2023 for daughter.     MS History   Around 05/20/2016, she had the onset of left eye pain, worse with movement.   Symptoms started while driving and she noted looking to merge was painful. She saw her ophthalmologist and she had an MRI 06/03/2016 showing optic nerve enhancement on the left and a second focus in the right hemisphere that also enhanced.     She noted mild blurry  vision shortly after that but it seemed worse 06/05/2016 and the next day, vision was worse.    She went to the [facility] ED and was admitted for 5 days of IV Solu-Medrol  (3 days in hospital, 2 as outpatient).  There, she had an MRI of the cervical spine that showed 2 more lesions (at C4C5 and T1) consistent with MS.  In retrospect, a few weeks before the visual symptoms, she noted right hand numbness, especially if she hold her phone which is persisting.     I have reviewed the MRI of the brain dated 06/03/2016 and the MRI of the cervical spine dated 06/08/2016.   The MRI of the brain showed enhancement of the left optic nerve. Additionally in the right temporal lobes there is another T2/FLAIR hyperintense focus that enhances. Additionally there is a periventricular right cerebellar focus that does not enhance. I also reviewed the MRI of the cervical spine. At C3-C4 there is a right lateral focus with subtle enhancement. There is also a right posterolateral focus adjacent to T1 that does not enhance.   Lab reports from her recent hospital stay were also evaluated. He is JCV antibody negative. Anti-NMO is negative.  She  started Tysabri  in March 2018.SABRA   She was started on Tysabri  and did well.  She discontinued Tysabri  summer 2020 after becoming pregnant.  She started Copaxone .  MRI of the brain after delivery March 2021 showed one new lesion that was not there previously.  She went back on Tysabri  after 01/2021 delivery (son Feliciano)  Imaging studies: MRI od the brain 01/2021 showed T2/FLAIR hyperintense foci in the cerebellum and cerebral hemispheres in a pattern consistent with chronic demyelinating plaque associated with multiple sclerosis.  None of the foci enhanced or appear to be acute.  Compared to the previous MRIs, there are no new lesions.  MRI of the brain 07/09/2019 shows T2/flair hyperintense foci in the right cerebellar hemisphere and in the cerebral hemispheres in a pattern configuration consistent with  chronic demyelinating plaque associated with multiple sclerosis.  None of the foci enhance or appear to be acute.  One focus in the right temporal lobe was not apparent on the previous MRI from 08/16/2018.  I of the brain 08/16/2018 shows T2/flair hyperintense focus adjacent to the fourth ventricle in the right cerebellum and a couple smaller foci in the periventricular deep white matter of the hemispheres consistent with chronic demyelinating plaque associated with multiple sclerosis.  When compared to the MRI dated 09/21/2017, there are no new lesions.  MRI of the cervical spine 09/21/2017 shows small foci within the spinal cord posteriorly adjacent to C2-C3, to the right adjacent to C4 and posteriorly to the right adjacent to T1.  All these foci were present on the 06/08/2016 MRI and were more apparent on the previous MRI than the current scan.  These are consistent with small chronic demyelinating plaques associated with multiple sclerosis   REVIEW OF SYSTEMS: Constitutional: No fevers, chills, sweats, or change in appetite.   She denies much fatigue. Eyes: No visual changes, double vision, eye pain Ear, nose and throat: No hearing loss, ear pain, nasal congestion, sore throat Cardiovascular: No chest pain, palpitations Respiratory:  No shortness of breath at rest or with exertion.   No wheezes GastrointestinaI: No nausea, vomiting, diarrhea, abdominal pain, fecal incontinence,    Has constipation Genitourinary:  No dysuria, urinary retention or frequency.  No nocturia.  Has had some yeast infections.  Musculoskeletal:  No neck pain, back pain Integumentary: No rash, pruritus, skin lesions Neurological: as above Psychiatric: Mood is doing well. Endocrine: No palpitations, diaphoresis, change in appetite, change in weigh or increased thirst Hematologic/Lymphatic:  No anemia, purpura, petechiae. Allergic/Immunologic: No itchy/runny eyes, nasal congestion, recent allergic reactions,  rashes  ALLERGIES: No Known Allergies  HOME MEDICATIONS:  Current Outpatient Medications:    cyclobenzaprine  (FLEXERIL ) 10 MG tablet, Take 1 tablet (10 mg total) by mouth 3 (three) times daily as needed for muscle spasms., Disp: 30 tablet, Rfl: 0   Glatiramer  Acetate 40 MG/ML SOSY, Inject 40 mg into the skin 3 (three) times a week., Disp: , Rfl:    IRON, FERROUS SULFATE, PO, Take 68 mg by mouth daily., Disp: , Rfl:    levothyroxine (SYNTHROID) 50 MCG tablet, Take 50 mcg by mouth daily before breakfast., Disp: , Rfl:    Prenatal Vit-Fe Fumarate-FA (PRENATAL VITAMINS PO), Take 1 tablet by mouth daily., Disp: , Rfl:    ferrous sulfate 324 MG TBEC, Take 324 mg by mouth. (Patient not taking: Reported on 11/01/2023), Disp: , Rfl:   PAST MEDICAL HISTORY: Past Medical History:  Diagnosis Date   Anxiety    Depression    Irritable bowel syndrome  Multiple sclerosis (HCC)    Multiple sclerosis (HCC)    Vaginal Pap smear, abnormal    Vision abnormalities     PAST SURGICAL HISTORY: Past Surgical History:  Procedure Laterality Date   CESAREAN SECTION N/A 05/21/2019   Procedure: CESAREAN SECTION;  Surgeon: Marne Kelly Nest, MD;  Location: [facility] LD ORS;  Service: Obstetrics;  Laterality: N/A;   CESAREAN SECTION N/A 01/09/2021   Procedure: REPEAT CESAREAN SECTION EDC: 01-16-21 ALLERG: NKDA  PREVIOUS X 1;  Surgeon: Marne Kelly Nest, MD;  [facility]: [facility];  Service: Obstetrics;  Laterality: N/A;   CYSTOSCOPY N/A 05/22/2019   Procedure: CYSTOSCOPY;  Surgeon: Marne Kelly Nest, MD;  Location: [facility] LD ORS;  Service: Gynecology;  Laterality: N/A;   LAPAROTOMY N/A 05/22/2019   Procedure: LAPAROTOMY;  Surgeon: Marne Kelly Nest, MD;  Location: [facility] LD ORS;  Service: Gynecology;  Laterality: N/A;   LEEP     suppose to have follow up of cervical cells in January   MYRINGOTOMY     WISDOM TOOTH EXTRACTION      FAMILY HISTORY: Family History  Problem Relation Age of Onset   Heart disease  Mother    Colon polyps Father    Healthy Father    Diabetes Maternal Grandmother    Leukemia Paternal Grandfather    Colon cancer Neg Hx    Esophageal cancer Neg Hx    Inflammatory bowel disease Neg Hx    Liver disease Neg Hx    Pancreatic cancer Neg Hx    Rectal cancer Neg Hx     SOCIAL HISTORY:  Social History   Socioeconomic History   Marital status: Married    Spouse name: Not on file   Number of children: Not on file   Years of education: Not on file   Highest education level: Not on file  Occupational History   Not on file  Tobacco Use   Smoking status: Never   Smokeless tobacco: Never  Vaping Use   Vaping status: Never Used  Substance and Sexual Activity   Alcohol use: Not Currently    Comment: occasional   Drug use: No   Sexual activity: Yes    Birth control/protection: None  Other Topics Concern   Not on file  Social History Narrative   Not on file   Social Drivers of Health   Financial Resource Strain: Low Risk  (08/29/2023)   Received from [facility]   Overall Financial Resource Strain (CARDIA)    Difficulty of Paying Living Expenses: Not hard at all  Food Insecurity: No Food Insecurity (08/29/2023)   Received from [facility]   Hunger Vital Sign    Within the past 12 months, you worried that your food would run out before you got the money to buy more.: Never true    Within the past 12 months, the food you bought just didn't last and you didn't have money to get more.: Never true  Transportation Needs: No Transportation Needs (08/29/2023)   Received from [facility] - Transportation    Lack of Transportation (Medical): No    Lack of Transportation (Non-Medical): No  Physical Activity: Not on file  Stress: Not on file  Social Connections: Unknown (09/11/2021)   Received from [facility]   Social Network    Social Network: Not on file  Intimate Partner Violence: Unknown (08/05/2021)   Received from [facility]   HITS     Physically Hurt: Not on file  Insult or Talk Down To: Not on file    Threaten Physical Harm: Not on file    Scream or Curse: Not on file     PHYSICAL EXAM  BP 106/69 (BP Location: Right Arm, Patient Position: Sitting, Cuff Size: Normal)   Pulse 85   Ht 5' 4.5 (1.638 m)   Wt 203 lb 8 oz (92.3 kg)   BMI 34.39 kg/m    Body mass index is 34.39 kg/m.   General: The patient is well-developed and well-nourished and in no acute distress   HEENT:   Head is normocephalic and atraumatic.    Neurologic Exam  Mental status: She has a good affect.. The patient is alert and oriented x 3 at the time of the examination. The patient has apparent normal recent and remote memory, with an apparently normal attention span and concentration ability.   Speech is normal.  Cranial nerves: Extraocular movements are full.  Visual acuity and color vision is symmetric.  Facial strength and sensation is normal.  Trapezius strength is normal.  No obvious hearing deficits are noted.  Motor:  Muscle bulk is normal.   Tone is normal. Strength is  5 / 5 in all 4 extremities.   Sensory: Normal sensation to touch and vibration in the arms and legs.   Gait and station: Station is normal.  The gait is normal.  Tandem gait is mildly wide.  Romberg is negative.   Reflexes: Deep tendon reflexes are symmetric and normal bilaterally.        ASSESSMENT AND PLAN  Multiple sclerosis (HCC) - Plan: Stratify JCV Antibody Test ([facility]), CBC with Differential/Platelet, Magnesium   High risk medication use - Plan: Stratify JCV Antibody Test ([facility]), CBC with Differential/Platelet, Magnesium   Leg cramps in pregnancy - Plan: Magnesium   Pregnancy, unspecified gestational age  Cognitive deficit secondary to multiple sclerosis (HCC)  Insomnia, unspecified type    1.   Continue glatiramer  for now.  She would like to get back on Tysabri  shortlyafter delivery.  Her MS has been very stable while on Tysabri .    JCV Ab is  negative but we will recheck as > 8 months since last tested.  2.  If insomnia worsens can take low dose melatonin 3.   Stay active and exercise as tolerated.   4.   Return in 6 months or sooner if there are new or worsening neurologic symptoms and depending on options of treatment.       Khalel Alms A. Vear, MD, PhD 11/01/2023, 10:21 AM   Certified in Neurology, Clinical Neurophysiology, Sleep Medicine, Pain Medicine and Neuroimaging  [facility] [address] [place], [place] [address] [phone]

## 2023-11-01 NOTE — Telephone Encounter (Signed)
 Faxed complete/signed Tysabri  start form to MS touch prescribing program at [phone]. Received fax confirmation. Gave order to [employer] to start approval process.  Per Dr. Vear, pt has planned C-section 12/05/23. Wanting her to start shortly after around the week of 12/12/23.

## 2023-11-01 NOTE — Telephone Encounter (Signed)
 Placed JCV in [facility] on 11/01/2023

## 2023-11-02 ENCOUNTER — Ambulatory Visit: Payer: Self-pay | Admitting: Neurology

## 2023-11-02 LAB — CBC WITH DIFFERENTIAL/PLATELET
Basophils Absolute: 0 10*3/uL (ref 0.0–0.2)
Basos: 0 %
EOS (ABSOLUTE): 0.1 10*3/uL (ref 0.0–0.4)
Eos: 1 %
Hematocrit: 33.5 % — ABNORMAL LOW (ref 34.0–46.6)
Hemoglobin: 11 g/dL — ABNORMAL LOW (ref 11.1–15.9)
Immature Grans (Abs): 0 10*3/uL (ref 0.0–0.1)
Immature Granulocytes: 0 %
Lymphocytes Absolute: 1.7 10*3/uL (ref 0.7–3.1)
Lymphs: 24 %
MCH: 30.9 pg (ref 26.6–33.0)
MCHC: 32.8 g/dL (ref 31.5–35.7)
MCV: 94 fL (ref 79–97)
Monocytes Absolute: 0.5 10*3/uL (ref 0.1–0.9)
Monocytes: 8 %
Neutrophils Absolute: 4.7 10*3/uL (ref 1.4–7.0)
Neutrophils: 67 %
Platelets: 207 10*3/uL (ref 150–450)
RBC: 3.56 x10E6/uL — ABNORMAL LOW (ref 3.77–5.28)
RDW: 14.6 % (ref 11.7–15.4)
WBC: 7.1 10*3/uL (ref 3.4–10.8)

## 2023-11-02 LAB — MAGNESIUM: Magnesium: 1.8 mg/dL (ref 1.6–2.3)

## 2023-11-15 LAB — OB RESULTS CONSOLE GBS: GBS: NEGATIVE

## 2023-11-24 ENCOUNTER — Encounter (HOSPITAL_COMMUNITY): Payer: Self-pay

## 2023-11-24 NOTE — Telephone Encounter (Signed)
 Per [place] in infusion suite, patient is coming on 12/10/23.

## 2023-11-24 NOTE — Patient Instructions (Signed)
 Brittany Johnston  11/24/2023   Your procedure is scheduled on:  12/05/2023  Arrive at 0800 at Entrance C on [address] at [facility]  and [employer]. You are invited to use the FREE valet parking or use the Visitor's parking deck.  Pick up the phone at the desk and dial [phone].  Call this number if you have problems the morning of surgery: [phone]  Remember:   Do not eat food:(After Midnight) Desps de medianoche.  You may drink clear liquids until  __0600___.  Clear liquids means a liquid you can see thru.  It can have color such as Cola or Kool aid.  Tea is OK and coffee as long as no milk or creamer of any kind.  Take these medicines the morning of surgery with A SIP OF WATER :  levothyroxine   Do not wear jewelry, make-up or nail polish.  Do not wear lotions, powders, or perfumes. Do not wear deodorant.  Do not shave 48 hours prior to surgery.  Do not bring valuables to the hospital.  [facility] is not   responsible for any belongings or valuables brought to the hospital.  Contacts, dentures or bridgework may not be worn into surgery.  Leave suitcase in the car. After surgery it may be brought to your room.  For patients admitted to the hospital, checkout time is 11:00 AM the day of              discharge.      Please read over the following fact sheets that you were given:     Preparing for Surgery

## 2023-11-24 NOTE — Telephone Encounter (Signed)
 I called patient.  She reports that she spoke with [employer] and received a co-pay card.  She will call the infusion suite and give them this information.  I will continue to follow.

## 2023-11-24 NOTE — Telephone Encounter (Signed)
 I spoke with [facility] in the infusion suite. Patient has not returned their calls.

## 2023-11-28 NOTE — H&P (Signed)
 RAFEEF Johnston is a 38 y.o. female presenting for scheduled RCS. She has a history of a previous cesarean section c/b pp XL of an 8#2 infan and scheduled RCS She has a history of MA and he has GAD controlled w/out meds. She has hx known kidney stones. She has hypothyroidism and is on Levothyroxine 50mcg.    OB History     Gravida  3   Para  2   Term  2   Preterm      AB      Living  2      SAB      IAB      Ectopic      Multiple  0   Live Births  2          Past Medical History:  Diagnosis Date   Anxiety    Depression    Irritable bowel syndrome    Multiple sclerosis (HCC)    Multiple sclerosis (HCC)    Vaginal Pap smear, abnormal    Vision abnormalities    Past Surgical History:  Procedure Laterality Date   CESAREAN SECTION N/A 05/21/2019   Procedure: CESAREAN SECTION;  Surgeon: Marne Kelly Nest, MD;  Location: [facility];  Service: Obstetrics;  Laterality: N/A;   CESAREAN SECTION N/A 01/09/2021   Procedure: REPEAT CESAREAN SECTION EDC: 01-16-21 ALLERG: NKDA  PREVIOUS X 1;  Surgeon: Marne Kelly Nest, MD;  Location: [facility];  Service: Obstetrics;  Laterality: N/A;   CYSTOSCOPY N/A 05/22/2019   Procedure: CYSTOSCOPY;  Surgeon: Marne Kelly Nest, MD;  Location: [facility];  Service: Gynecology;  Laterality: N/A;   LAPAROTOMY N/A 05/22/2019   Procedure: LAPAROTOMY;  Surgeon: Marne Kelly Nest, MD;  Location: [facility];  Service: Gynecology;  Laterality: N/A;   LEEP     suppose to have follow up of cervical cells in January   MYRINGOTOMY     TYMPANOPLASTY     WISDOM TOOTH EXTRACTION     Family History: family history includes Colon polyps in her father; Diabetes in her maternal grandmother; Heart disease in her mother; Leukemia in her paternal grandfather; Prostate cancer in her father. Social History:  reports that she has never smoked. She has never used smokeless tobacco. She reports that she does not currently use alcohol. She reports  that she does not use drugs.     Maternal Diabetes: No Genetic Screening: Normal Maternal Ultrasounds/Referrals: Normal Fetal Ultrasounds or other Referrals:  None Maternal Substance Abuse:  No Significant Maternal Medications:  None Significant Maternal Lab Results:  None and Group B Strep negative Number of Prenatal Visits:greater than 3 verified prenatal visits  Review of Systems History   unknown if currently breastfeeding. Exam Physical Exam  (from office) NAD, A&O NWOB Abd soft, nondistended, gravid  Prenatal labs: ABO, Rh:   Antibody:   Rubella:   RPR:    HBsAg:    HIV:    GBS:     Assessment/Plan:  38 yo presenting for scheduled R CS. Risks discussed including infection, bleeding, damage to surrounding structures, the need for additional procedures including hysterectomy, and the possibility of uterine rupture with neonatal morbidity/mortality, scarring, and abnormal placentation with subsequent pregnancies. Patient agrees to proceed. 2g ancef  on call to OR. Continue home meds.     Kelly Nest Brittany Johnston 11/28/2023, 1:24 PM

## 2023-12-02 ENCOUNTER — Encounter (HOSPITAL_COMMUNITY)
Admission: RE | Admit: 2023-12-02 | Discharge: 2023-12-02 | Disposition: A | Discharge status: Home / Self Care | Source: Ambulatory Visit | Attending: Obstetrics and Gynecology | Admitting: Obstetrics and Gynecology

## 2023-12-02 DIAGNOSIS — Z01812 Encounter for preprocedural laboratory examination: Secondary | ICD-10-CM | POA: Insufficient documentation

## 2023-12-02 DIAGNOSIS — Z3490 Encounter for supervision of normal pregnancy, unspecified, unspecified trimester: Secondary | ICD-10-CM

## 2023-12-02 LAB — CBC
HCT: 35.2 % — ABNORMAL LOW (ref 36.0–46.0)
Hemoglobin: 11.7 g/dL — ABNORMAL LOW (ref 12.0–15.0)
MCH: 31 pg (ref 26.0–34.0)
MCHC: 33.2 g/dL (ref 30.0–36.0)
MCV: 93.4 fL (ref 80.0–100.0)
Platelets: 210 K/uL (ref 150–400)
RBC: 3.77 MIL/uL — ABNORMAL LOW (ref 3.87–5.11)
RDW: 14 % (ref 11.5–15.5)
WBC: 8 K/uL (ref 4.0–10.5)
nRBC: 0 % (ref 0.0–0.2)

## 2023-12-02 LAB — TYPE AND SCREEN
ABO/RH(D): O POS
Antibody Screen: NEGATIVE

## 2023-12-02 LAB — RPR: RPR Ser Ql: NONREACTIVE

## 2023-12-05 ENCOUNTER — Inpatient Hospital Stay (HOSPITAL_COMMUNITY): Admitting: Anesthesiology

## 2023-12-05 ENCOUNTER — Inpatient Hospital Stay (HOSPITAL_COMMUNITY)
Admission: RE | Admit: 2023-12-05 | Discharge: 2023-12-08 | DRG: 787 | Disposition: A | Discharge status: Home / Self Care | Payer: Self-pay | Attending: Obstetrics and Gynecology | Admitting: Obstetrics and Gynecology

## 2023-12-05 ENCOUNTER — Encounter (HOSPITAL_COMMUNITY)
Admission: RE | Disposition: A | Discharge status: Home / Self Care | Payer: Self-pay | Source: Home / Self Care | Attending: Obstetrics and Gynecology

## 2023-12-05 ENCOUNTER — Other Ambulatory Visit: Payer: Self-pay

## 2023-12-05 ENCOUNTER — Encounter (HOSPITAL_COMMUNITY): Payer: Self-pay | Admitting: Obstetrics and Gynecology

## 2023-12-05 DIAGNOSIS — Z8249 Family history of ischemic heart disease and other diseases of the circulatory system: Secondary | ICD-10-CM

## 2023-12-05 DIAGNOSIS — E039 Hypothyroidism, unspecified: Secondary | ICD-10-CM | POA: Diagnosis present

## 2023-12-05 DIAGNOSIS — Z833 Family history of diabetes mellitus: Secondary | ICD-10-CM | POA: Diagnosis not present

## 2023-12-05 DIAGNOSIS — Z3A39 39 weeks gestation of pregnancy: Secondary | ICD-10-CM

## 2023-12-05 DIAGNOSIS — O34211 Maternal care for low transverse scar from previous cesarean delivery: Principal | ICD-10-CM | POA: Diagnosis present

## 2023-12-05 DIAGNOSIS — O99354 Diseases of the nervous system complicating childbirth: Secondary | ICD-10-CM | POA: Diagnosis present

## 2023-12-05 DIAGNOSIS — O99284 Endocrine, nutritional and metabolic diseases complicating childbirth: Secondary | ICD-10-CM | POA: Diagnosis present

## 2023-12-05 DIAGNOSIS — G35 Multiple sclerosis: Secondary | ICD-10-CM | POA: Diagnosis present

## 2023-12-05 DIAGNOSIS — Z3490 Encounter for supervision of normal pregnancy, unspecified, unspecified trimester: Principal | ICD-10-CM

## 2023-12-05 SURGERY — Surgical Case
Anesthesia: Spinal

## 2023-12-05 MED ORDER — FENTANYL CITRATE (PF) 100 MCG/2ML IJ SOLN: 25.0000 ug | INTRAMUSCULAR | Status: DC | PRN

## 2023-12-05 MED ORDER — BUPIVACAINE IN DEXTROSE 0.75-8.25 % IT SOLN
INTRATHECAL | Status: DC | PRN
  Administered 2023-12-05: 2 mL via INTRATHECAL

## 2023-12-05 MED ORDER — ACETAMINOPHEN 500 MG PO TABS
ORAL_TABLET | ORAL | Status: AC
Start: 2023-12-05 — End: 2023-12-05
  Filled 2023-12-05: qty 2

## 2023-12-05 MED ORDER — STERILE WATER FOR IRRIGATION IR SOLN
Status: DC | PRN
  Administered 2023-12-05: 1000 mL

## 2023-12-05 MED ORDER — KETOROLAC TROMETHAMINE 30 MG/ML IJ SOLN
30.0000 mg | Freq: Four times a day (QID) | INTRAMUSCULAR | Status: AC
  Administered 2023-12-05 – 2023-12-06 (×3): 30 mg via INTRAVENOUS
  Filled 2023-12-05 (×3): qty 1

## 2023-12-05 MED ORDER — CEFAZOLIN SODIUM-DEXTROSE 2-4 GM/100ML-% IV SOLN
2.0000 g | INTRAVENOUS | Status: AC
  Administered 2023-12-05: 2 g via INTRAVENOUS

## 2023-12-05 MED ORDER — SIMETHICONE 80 MG PO CHEW: 80.0000 mg | CHEWABLE_TABLET | ORAL | Status: DC | PRN

## 2023-12-05 MED ORDER — DEXAMETHASONE SODIUM PHOSPHATE 4 MG/ML IJ SOLN
INTRAMUSCULAR | Status: AC
  Filled 2023-12-05: qty 1

## 2023-12-05 MED ORDER — NALOXONE HCL 4 MG/10ML IJ SOLN: 1.0000 ug/kg/h | INTRAVENOUS | Status: DC | PRN

## 2023-12-05 MED ORDER — MORPHINE SULFATE (PF) 0.5 MG/ML IJ SOLN
INTRAMUSCULAR | Status: DC | PRN
  Administered 2023-12-05: 150 ug via INTRATHECAL

## 2023-12-05 MED ORDER — LEVOTHYROXINE SODIUM 50 MCG PO TABS
50.0000 ug | ORAL_TABLET | Freq: Every day | ORAL | Status: DC
  Administered 2023-12-06 – 2023-12-08 (×3): 50 ug via ORAL
  Filled 2023-12-05 (×3): qty 1

## 2023-12-05 MED ORDER — MORPHINE SULFATE (PF) 0.5 MG/ML IJ SOLN
INTRAMUSCULAR | Status: AC
  Filled 2023-12-05: qty 10

## 2023-12-05 MED ORDER — IBUPROFEN 600 MG PO TABS
600.0000 mg | ORAL_TABLET | Freq: Four times a day (QID) | ORAL | Status: DC
  Administered 2023-12-06 – 2023-12-08 (×9): 600 mg via ORAL
  Filled 2023-12-05 (×9): qty 1

## 2023-12-05 MED ORDER — OXYTOCIN-SODIUM CHLORIDE 30-0.9 UT/500ML-% IV SOLN
INTRAVENOUS | Status: DC | PRN
Start: 2023-12-05 — End: 2023-12-05
  Administered 2023-12-05: 999 mL/h via INTRAVENOUS

## 2023-12-05 MED ORDER — DIPHENHYDRAMINE HCL 50 MG/ML IJ SOLN
12.5000 mg | INTRAMUSCULAR | Status: DC | PRN
  Administered 2023-12-05: 12.5 mg via INTRAVENOUS
  Filled 2023-12-05: qty 1

## 2023-12-05 MED ORDER — DIPHENHYDRAMINE HCL 25 MG PO CAPS
25.0000 mg | ORAL_CAPSULE | Freq: Four times a day (QID) | ORAL | Status: DC | PRN
  Filled 2023-12-05: qty 1

## 2023-12-05 MED ORDER — ZOLPIDEM TARTRATE 5 MG PO TABS: 5.0000 mg | ORAL_TABLET | Freq: Every evening | ORAL | Status: DC | PRN

## 2023-12-05 MED ORDER — FENTANYL CITRATE (PF) 100 MCG/2ML IJ SOLN
INTRAMUSCULAR | Status: AC
  Filled 2023-12-05: qty 2

## 2023-12-05 MED ORDER — SODIUM CHLORIDE 0.9 % IR SOLN
Status: DC | PRN
  Administered 2023-12-05: 1

## 2023-12-05 MED ORDER — SIMETHICONE 80 MG PO CHEW
80.0000 mg | CHEWABLE_TABLET | Freq: Three times a day (TID) | ORAL | Status: DC
  Administered 2023-12-05 – 2023-12-08 (×8): 80 mg via ORAL
  Filled 2023-12-05 (×8): qty 1

## 2023-12-05 MED ORDER — LACTATED RINGERS IV SOLN: INTRAVENOUS | Status: DC

## 2023-12-05 MED ORDER — OXYCODONE HCL 5 MG/5ML PO SOLN: 5.0000 mg | Freq: Once | ORAL | Status: DC | PRN

## 2023-12-05 MED ORDER — METOCLOPRAMIDE HCL 5 MG/ML IJ SOLN
INTRAMUSCULAR | Status: DC | PRN
  Administered 2023-12-05: 10 mg via INTRAVENOUS

## 2023-12-05 MED ORDER — CEFAZOLIN SODIUM-DEXTROSE 2-4 GM/100ML-% IV SOLN
INTRAVENOUS | Status: AC
  Filled 2023-12-05: qty 100

## 2023-12-05 MED ORDER — COCONUT OIL OIL: 1.0000 | TOPICAL_OIL | Status: DC | PRN

## 2023-12-05 MED ORDER — ONDANSETRON HCL 4 MG/2ML IJ SOLN: 4.0000 mg | Freq: Three times a day (TID) | INTRAMUSCULAR | Status: DC | PRN

## 2023-12-05 MED ORDER — TRANEXAMIC ACID-NACL 1000-0.7 MG/100ML-% IV SOLN
INTRAVENOUS | Status: AC
Start: 2023-12-05 — End: 2023-12-05
  Filled 2023-12-05: qty 100

## 2023-12-05 MED ORDER — PHENYLEPHRINE HCL-NACL 20-0.9 MG/250ML-% IV SOLN
INTRAVENOUS | Status: DC | PRN
  Administered 2023-12-05: 50 ug/min via INTRAVENOUS

## 2023-12-05 MED ORDER — DIBUCAINE (PERIANAL) 1 % EX OINT: 1.0000 | TOPICAL_OINTMENT | CUTANEOUS | Status: DC | PRN

## 2023-12-05 MED ORDER — KETOROLAC TROMETHAMINE 30 MG/ML IJ SOLN
INTRAMUSCULAR | Status: AC
  Filled 2023-12-05: qty 1

## 2023-12-05 MED ORDER — DIPHENHYDRAMINE HCL 25 MG PO CAPS
25.0000 mg | ORAL_CAPSULE | ORAL | Status: DC | PRN
  Administered 2023-12-05: 25 mg via ORAL

## 2023-12-05 MED ORDER — NALOXONE HCL 0.4 MG/ML IJ SOLN: 0.4000 mg | INTRAMUSCULAR | Status: DC | PRN

## 2023-12-05 MED ORDER — OXYCODONE HCL 5 MG PO TABS
5.0000 mg | ORAL_TABLET | ORAL | Status: DC | PRN
  Administered 2023-12-06: 10 mg via ORAL
  Administered 2023-12-06: 5 mg via ORAL
  Administered 2023-12-07 – 2023-12-08 (×4): 10 mg via ORAL
  Filled 2023-12-05 (×2): qty 2
  Filled 2023-12-05: qty 1
  Filled 2023-12-05 (×3): qty 2

## 2023-12-05 MED ORDER — KETOROLAC TROMETHAMINE 30 MG/ML IJ SOLN
30.0000 mg | Freq: Four times a day (QID) | INTRAMUSCULAR | Status: AC | PRN
  Administered 2023-12-05: 30 mg via INTRAVENOUS

## 2023-12-05 MED ORDER — MENTHOL 3 MG MT LOZG: 1.0000 | LOZENGE | OROMUCOSAL | Status: DC | PRN

## 2023-12-05 MED ORDER — ONDANSETRON HCL 4 MG/2ML IJ SOLN
INTRAMUSCULAR | Status: AC
  Filled 2023-12-05: qty 4

## 2023-12-05 MED ORDER — DEXAMETHASONE SODIUM PHOSPHATE 10 MG/ML IJ SOLN
INTRAMUSCULAR | Status: DC | PRN
  Administered 2023-12-05: 5 mg via INTRAVENOUS

## 2023-12-05 MED ORDER — OXYCODONE HCL 5 MG PO TABS: 5.0000 mg | ORAL_TABLET | Freq: Once | ORAL | Status: DC | PRN

## 2023-12-05 MED ORDER — LACTATED RINGERS IV SOLN
INTRAVENOUS | Status: DC | PRN
Start: 2023-12-05 — End: 2023-12-05

## 2023-12-05 MED ORDER — FENTANYL CITRATE (PF) 100 MCG/2ML IJ SOLN
INTRAMUSCULAR | Status: DC | PRN
  Administered 2023-12-05: 15 ug via INTRATHECAL

## 2023-12-05 MED ORDER — TRANEXAMIC ACID-NACL 1000-0.7 MG/100ML-% IV SOLN
1000.0000 mg | Freq: Once | INTRAVENOUS | Status: AC
  Administered 2023-12-05: 1000 mg via INTRAVENOUS

## 2023-12-05 MED ORDER — ACETAMINOPHEN 500 MG PO TABS
1000.0000 mg | ORAL_TABLET | Freq: Four times a day (QID) | ORAL | Status: DC
  Administered 2023-12-05 – 2023-12-08 (×12): 1000 mg via ORAL
  Filled 2023-12-05 (×13): qty 2

## 2023-12-05 MED ORDER — SCOPOLAMINE 1 MG/3DAYS TD PT72: 1.0000 | MEDICATED_PATCH | Freq: Once | TRANSDERMAL | Status: DC

## 2023-12-05 MED ORDER — PRENATAL MULTIVITAMIN CH
1.0000 | ORAL_TABLET | Freq: Every day | ORAL | Status: DC
  Administered 2023-12-06 – 2023-12-08 (×3): 1 via ORAL
  Filled 2023-12-05 (×3): qty 1

## 2023-12-05 MED ORDER — WITCH HAZEL-GLYCERIN EX PADS
1.0000 | MEDICATED_PAD | CUTANEOUS | Status: DC | PRN
  Administered 2023-12-06: 1 via TOPICAL

## 2023-12-05 MED ORDER — ACETAMINOPHEN 500 MG PO TABS
1000.0000 mg | ORAL_TABLET | ORAL | Status: AC
  Administered 2023-12-05: 1000 mg via ORAL

## 2023-12-05 MED ORDER — POVIDONE-IODINE 10 % EX SWAB
2.0000 | Freq: Once | CUTANEOUS | Status: AC
  Administered 2023-12-05: 2 via TOPICAL

## 2023-12-05 MED ORDER — KETOROLAC TROMETHAMINE 30 MG/ML IJ SOLN: 30.0000 mg | Freq: Four times a day (QID) | INTRAMUSCULAR | Status: AC | PRN

## 2023-12-05 MED ORDER — SODIUM CHLORIDE 0.9% FLUSH: 3.0000 mL | INTRAVENOUS | Status: DC | PRN

## 2023-12-05 MED ORDER — HYDROMORPHONE HCL 1 MG/ML IJ SOLN: 0.2000 mg | INTRAMUSCULAR | Status: DC | PRN

## 2023-12-05 MED ORDER — SENNOSIDES-DOCUSATE SODIUM 8.6-50 MG PO TABS
2.0000 | ORAL_TABLET | Freq: Every day | ORAL | Status: DC
  Administered 2023-12-06 – 2023-12-08 (×3): 2 via ORAL
  Filled 2023-12-05 (×3): qty 2

## 2023-12-05 MED ORDER — FENTANYL CITRATE (PF) 100 MCG/2ML IJ SOLN
INTRAMUSCULAR | Status: DC | PRN
  Administered 2023-12-05: 85 ug via INTRAVENOUS

## 2023-12-05 MED ORDER — OXYTOCIN-SODIUM CHLORIDE 30-0.9 UT/500ML-% IV SOLN: 2.5000 [IU]/h | INTRAVENOUS | Status: AC

## 2023-12-05 MED ORDER — ONDANSETRON HCL 4 MG/2ML IJ SOLN
INTRAMUSCULAR | Status: DC | PRN
  Administered 2023-12-05: 4 mg via INTRAVENOUS

## 2023-12-05 MED ORDER — PROPOFOL 500 MG/50ML IV EMUL
INTRAVENOUS | Status: AC
  Filled 2023-12-05: qty 50

## 2023-12-05 MED ORDER — EPHEDRINE 5 MG/ML INJ
INTRAVENOUS | Status: AC
  Filled 2023-12-05: qty 10

## 2023-12-05 MED ORDER — ACETAMINOPHEN 500 MG PO TABS: 1000.0000 mg | ORAL_TABLET | Freq: Four times a day (QID) | ORAL | Status: DC

## 2023-12-05 MED ORDER — PHENYLEPHRINE HCL-NACL 20-0.9 MG/250ML-% IV SOLN
INTRAVENOUS | Status: AC
  Filled 2023-12-05: qty 250

## 2023-12-05 SURGICAL SUPPLY — 27 items
BENZOIN TINCTURE PRP APPL 2/3 (GAUZE/BANDAGES/DRESSINGS) ×2 IMPLANT
CHLORAPREP W/TINT 26 (MISCELLANEOUS) ×4 IMPLANT
CLAMP UMBILICAL CORD (MISCELLANEOUS) ×2 IMPLANT
CLOTH BEACON ORANGE TIMEOUT ST (SAFETY) ×2 IMPLANT
CLSR STERI-STRIP ANTIMIC 1/2X4 (GAUZE/BANDAGES/DRESSINGS) ×2 IMPLANT
DRSG OPSITE POSTOP 4X10 (GAUZE/BANDAGES/DRESSINGS) ×2 IMPLANT
ELECTRODE REM PT RTRN 9FT ADLT (ELECTROSURGICAL) ×2 IMPLANT
EXTRACTOR VACUUM KIWI (MISCELLANEOUS) IMPLANT
GLOVE BIO SURGEON STRL SZ 6.5 (GLOVE) ×2 IMPLANT
GLOVE BIOGEL PI IND STRL 6.5 (GLOVE) ×2 IMPLANT
GLOVE BIOGEL PI IND STRL 7.0 (GLOVE) ×4 IMPLANT
GOWN STRL REUS W/TWL LRG LVL3 (GOWN DISPOSABLE) ×4 IMPLANT
KIT ABG SYR 3ML LUER SLIP (SYRINGE) ×2 IMPLANT
NDL HYPO 25X5/8 SAFETYGLIDE (NEEDLE) ×2 IMPLANT
NEEDLE HYPO 25X5/8 SAFETYGLIDE (NEEDLE) ×1 IMPLANT
NS IRRIG 1000ML POUR BTL (IV SOLUTION) ×2 IMPLANT
PACK C SECTION WH (CUSTOM PROCEDURE TRAY) ×2 IMPLANT
PAD OB MATERNITY 4.3X12.25 (PERSONAL CARE ITEMS) ×2 IMPLANT
STRIP CLOSURE SKIN 1/2X4 (GAUZE/BANDAGES/DRESSINGS) IMPLANT
SUT PLAIN 0 NONE (SUTURE) IMPLANT
SUT PLAIN ABS 2-0 CT1 27XMFL (SUTURE) ×2 IMPLANT
SUT VIC AB 0 CT1 36 (SUTURE) ×2 IMPLANT
SUT VIC AB 0 CTX36XBRD ANBCTRL (SUTURE) ×4 IMPLANT
SUT VIC AB 4-0 PS2 27 (SUTURE) ×2 IMPLANT
TOWEL OR 17X24 6PK STRL BLUE (TOWEL DISPOSABLE) ×2 IMPLANT
TRAY FOLEY W/BAG SLVR 14FR LF (SET/KITS/TRAYS/PACK) IMPLANT
WATER STERILE IRR 1000ML POUR (IV SOLUTION) ×2 IMPLANT

## 2023-12-05 NOTE — Progress Notes (Signed)
No updates to above H&P. Patient arrived NPO and was consented in PACU. Risks again discussed, all questions answered, and consent signed. Proceed with above surgery.    Kailo Kosik MD  

## 2023-12-05 NOTE — Anesthesia Postprocedure Evaluation (Signed)
 Anesthesia Post Note  Patient: Brittany Johnston  Procedure(s) Performed: CESAREAN DELIVERY     Patient location during evaluation: PACU Anesthesia Type: Spinal Level of consciousness: oriented and awake and alert Pain management: pain level controlled Vital Signs Assessment: post-procedure vital signs reviewed and stable Respiratory status: spontaneous breathing, respiratory function stable and patient connected to nasal cannula oxygen Cardiovascular status: blood pressure returned to baseline and stable Postop Assessment: no headache, no backache and no apparent nausea or vomiting Anesthetic complications: no   No notable events documented.  Last Vitals:  Vitals:   12/05/23 1430 12/05/23 1522  BP: 106/66 99/67  Pulse: 74 67  Resp: 19 18  Temp: (!) 36.4 C 36.5 C  SpO2: 98% 99%    Last Pain:  Vitals:   12/05/23 1522  TempSrc: Axillary  PainSc: 3    Pain Goal:                   Adisynn Suleiman L Jessilynn Taft

## 2023-12-05 NOTE — Anesthesia Preprocedure Evaluation (Addendum)
 Anesthesia Evaluation  Patient identified by MRN, date of birth, ID band Patient awake    Reviewed: Allergy & Precautions, NPO status , Patient's Chart, lab work & pertinent test results  Airway Mallampati: II  TM Distance: >3 FB Neck ROM: Full    Dental no notable dental hx. (+) Teeth Intact, Dental Advisory Given   Pulmonary neg pulmonary ROS   Pulmonary exam normal breath sounds clear to auscultation       Cardiovascular negative cardio ROS Normal cardiovascular exam Rhythm:Regular Rate:Normal     Neuro/Psych  PSYCHIATRIC DISORDERS Anxiety Depression    MS negative neurological ROS     GI/Hepatic negative GI ROS, Neg liver ROS,,,  Endo/Other  Hypothyroidism    Renal/GU negative Renal ROS  negative genitourinary   Musculoskeletal negative musculoskeletal ROS (+)    Abdominal   Peds  Hematology negative hematology ROS (+)   Anesthesia Other Findings Repeat C/S, h/o repeat C/Sx2  Reproductive/Obstetrics (+) Pregnancy                              Anesthesia Physical Anesthesia Plan  ASA: 2  Anesthesia Plan: Spinal   Post-op Pain Management:    Induction:   PONV Risk Score and Plan: Treatment may vary due to age or medical condition  Airway Management Planned: Natural Airway  Additional Equipment:   Intra-op Plan:   Post-operative Plan:   Informed Consent: I have reviewed the patients History and Physical, chart, labs and discussed the procedure including the risks, benefits and alternatives for the proposed anesthesia with the patient or authorized representative who has indicated his/her understanding and acceptance.     Dental advisory given  Plan Discussed with: CRNA  Anesthesia Plan Comments:          Anesthesia Quick Evaluation

## 2023-12-05 NOTE — Anesthesia Procedure Notes (Signed)
 Spinal  Patient location during procedure: OR Start time: 12/05/2023 9:33 AM End time: 12/05/2023 9:36 AM Reason for block: surgical anesthesia Staffing Performed: anesthesiologist  Anesthesiologist: Niels Marien CROME, MD Performed by: Niels Marien CROME, MD Authorized by: Niels Marien CROME, MD   Preanesthetic Checklist Completed: patient identified, IV checked, risks and benefits discussed, surgical consent, monitors and equipment checked, pre-op evaluation and timeout performed Spinal Block Patient position: sitting Prep: DuraPrep and site prepped and draped Patient monitoring: cardiac monitor, continuous pulse ox and blood pressure Approach: midline Location: L3-4 Injection technique: single-shot Needle Needle type: Pencan  Needle gauge: 24 G Needle length: 9 cm Assessment Sensory level: T6 Events: CSF return Additional Notes Functioning IV was confirmed and monitors were applied. Sterile prep and drape, including hand hygiene and sterile gloves were used. The patient was positioned and the spine was prepped. The skin was anesthetized with lidocaine .  Free flow of clear CSF was obtained prior to injecting local anesthetic into the CSF.  The spinal needle aspirated freely following injection.  The needle was carefully withdrawn.  The patient tolerated the procedure well.

## 2023-12-05 NOTE — Op Note (Signed)
 PROCEDURE DATE: 12/05/23   PREOPERATIVE DIAGNOSIS: Repeat CS   POSTOPERATIVE DIAGNOSIS: The same   PROCEDURE:    Repeat Low Transverse Cesarean Section   SURGEON:  Dr. Kelly Milian  ASSISTANT: Dr. Jacob Stinson  An experienced assistant was required given the standard of surgical care given the complexity of the case.  This assistant was needed for exposure, dissection, suctioning, retraction, instrument exchange, assisting with delivery with administration of fundal pressure, and for overall help during the procedure.    INDICATIONS: This is a 38 yo G3P2002 at 55 wga requiring cesarean section secondary to repeat.  Decision made to proceed with LTCS. The risks of cesarean section discussed with the patient included but were not limited to: bleeding which may require transfusion or reoperation; infection which may require antibiotics; injury to bowel, bladder, ureters or other surrounding organs; injury to the fetus; need for additional procedures including hysterectomy in the event of a life-threatening hemorrhage; placental abnormalities wth subsequent pregnancies, incisional problems, thromboembolic phenomenon and other postoperative/anesthesia complications. The patient agreed with the proposed plan, giving informed consent for the procedure.     FINDINGS:  Viable female infant in vertex presentation, APGARs pending,  Weight pending, Amniotic fluid clear,  Intact placenta, three vessel cord.  Dense fascial adhesive disease - thick. Bladder tacked very high. Grossly normal uterus. .   ANESTHESIA:    Epidural ESTIMATED BLOOD LOSS: 568cc SPECIMENS: Placenta for routine COMPLICATIONS: None immediate    PROCEDURE IN DETAIL:  The patient received intravenous antibiotics (2g Ancef ) and had sequential compression devices applied to her lower extremities while in the preoperative area.  She was then taken to the operating room where epidural anesthesia was dosed up to surgical level and was found to  be adequate. She was then placed in a dorsal supine position with a leftward tilt, and prepped and draped in a sterile manner.  A foley catheter was placed into her bladder and attached to constant gravity.  After an adequate timeout was performed, a Pfannenstiel skin incision was made with scalpel and carried through to the underlying layer of fascia. The fascia was incised in the midline and this incision was extended bilaterally using the Mayo scissors. Kocher clamps were applied to the superior aspect of the fascial incision and the underlying rectus muscles were dissected off bluntly and sharply. A similar process was carried out on the inferior aspect of the facial incision. The rectus muscles were separated in the midline bluntly and the peritoneum was entered bluntly.  A bladder flap was created sharply and developed bluntly. A transverse hysterotomy was made with a scalpel and extended bilaterally bluntly. The bladder blade was then removed. The infant was successfully delivered with assistance of a kiwi vacuum over gentle traction, and cord was clamped and cut and infant was handed over to awaiting neonatology team. Uterine massage was then administered and the placenta delivered intact with three-vessel cord. Cord gases were taken. The uterus was cleared of clot and debris.  The hysterotomy was closed with 0 vicryl.  A second imbricating suture of 0-vicryl was used to reinforce the incision and aid in hemostasis.The fascia was closed with 0-Vicryl in a running fashion with good restoration of anatomy.  The subcutaneus tissue was irrigated and was reapproximated using three interrupted plain gut stitches.  The skin was closed with 4-0 Vicryl in a subcuticular fashion.  All surgical site and was hemostatic at end of procedure) without any further bleeding on exam.    It's a girl -  Juliette!!   Pt tolerated the procedure well. All sponge/lap/needle counts were correct  X 2. Pt taken to recovery room  in stable condition.     Kelly Milian MD

## 2023-12-05 NOTE — Transfer of Care (Signed)
 Immediate Anesthesia Transfer of Care Note  Patient: Brittany Johnston  Procedure(s) Performed: CESAREAN DELIVERY  Patient Location: PACU  Anesthesia Type:Spinal  Level of Consciousness: awake, alert , and oriented  Airway & Oxygen Therapy: Patient Spontanous Breathing  Post-op Assessment: Report given to RN and Post -op Vital signs reviewed and stable  Post vital signs: Reviewed and stable  Last Vitals:  Vitals Value Taken Time  BP 106/66 12/05/23 14:30  Temp 36.4 C 12/05/23 14:30  Pulse 74 12/05/23 14:30  Resp 19 12/05/23 14:30  SpO2 98 % 12/05/23 14:30    Last Pain:  Vitals:   12/05/23 1430  TempSrc: Oral  PainSc: 0-No pain         Complications: No notable events documented.

## 2023-12-06 LAB — CBC
HCT: 29 % — ABNORMAL LOW (ref 36.0–46.0)
Hemoglobin: 9.5 g/dL — ABNORMAL LOW (ref 12.0–15.0)
MCH: 30.9 pg (ref 26.0–34.0)
MCHC: 32.8 g/dL (ref 30.0–36.0)
MCV: 94.5 fL (ref 80.0–100.0)
Platelets: 169 K/uL (ref 150–400)
RBC: 3.07 MIL/uL — ABNORMAL LOW (ref 3.87–5.11)
RDW: 13.9 % (ref 11.5–15.5)
WBC: 10.3 K/uL (ref 4.0–10.5)
nRBC: 0 % (ref 0.0–0.2)

## 2023-12-06 MED ORDER — POLYETHYLENE GLYCOL 3350 17 G PO PACK
17.0000 g | PACK | Freq: Every day | ORAL | Status: DC
  Administered 2023-12-06 – 2023-12-08 (×3): 17 g via ORAL
  Filled 2023-12-06 (×3): qty 1

## 2023-12-06 MED ORDER — FERROUS SULFATE 325 (65 FE) MG PO TABS
325.0000 mg | ORAL_TABLET | Freq: Every day | ORAL | Status: DC
  Administered 2023-12-06 – 2023-12-08 (×3): 325 mg via ORAL
  Filled 2023-12-06 (×3): qty 1

## 2023-12-06 NOTE — Progress Notes (Signed)
 Postpartum Progress Note  S: No complaints. Feeling well. Lochia appropriate. No subjective fevers/chills, Cp, or SOB. Ambulating.   O:     12/06/2023    5:31 AM 12/06/2023   12:46 AM 12/05/2023    8:20 PM  Vitals with BMI  Systolic 95 93 107  Diastolic 68 61 67  Pulse 68 62 70    Gen: NAD, A&O Pulm: NWOB Abd: soft, appropriately ttp, fundus firm and below Umb. Incision c/d under honeycomb Ext: No evidence of DVT  UOP adequate.   Labs Recent Results (from the past 2160 hours)  Urinalysis, Routine w reflex microscopic -Urine, Clean Catch     Status: Abnormal   Collection Time: 09/09/23 11:13 PM  Result Value Ref Range   Color, Urine YELLOW YELLOW   APPearance HAZY (A) CLEAR   Specific Gravity, Urine 1.009 1.005 - 1.030   pH 6.0 5.0 - 8.0   Glucose, UA NEGATIVE NEGATIVE mg/dL   Hgb urine dipstick NEGATIVE NEGATIVE   Bilirubin Urine NEGATIVE NEGATIVE   Ketones, ur NEGATIVE NEGATIVE mg/dL   Protein, ur NEGATIVE NEGATIVE mg/dL   Nitrite NEGATIVE NEGATIVE   Leukocytes,Ua SMALL (A) NEGATIVE   RBC / HPF 0-5 0 - 5 RBC/hpf   WBC, UA 0-5 0 - 5 WBC/hpf   Bacteria, UA MANY (A) NONE SEEN   Squamous Epithelial / HPF 0-5 0 - 5 /HPF    Comment: Performed at [facility], [address] [place], [place] [address]  Culture, MAINE Urine     Status: Abnormal   Collection Time: 09/09/23 11:13 PM   Specimen: Urine, Random  Result Value Ref Range   Specimen Description URINE, RANDOM    Special Requests NONE    Culture (A)     MULTIPLE SPECIES PRESENT, SUGGEST RECOLLECTION NO GROUP B STREP (S.AGALACTIAE) ISOLATED Performed at [facility], [address] [place], [place] [address]    Report Status 09/11/2023 FINAL   Wet prep, genital     Status: Abnormal   Collection Time: 09/09/23 11:58 PM   Specimen: Cervix  Result Value Ref Range   Yeast Wet Prep HPF POC NONE SEEN NONE SEEN   Trich, Wet Prep NONE SEEN NONE SEEN   Clue Cells Wet Prep HPF POC NONE SEEN NONE SEEN   WBC, Wet  Prep HPF POC >=10 (A) <10   Sperm NONE SEEN     Comment: Performed at [facility], [address] [place], [place] [address]  Urinalysis, Routine w reflex microscopic -Urine, Clean Catch     Status: Abnormal   Collection Time: 10/14/23  7:07 PM  Result Value Ref Range   Color, Urine YELLOW YELLOW   APPearance HAZY (A) CLEAR   Specific Gravity, Urine 1.017 1.005 - 1.030   pH 6.0 5.0 - 8.0   Glucose, UA NEGATIVE NEGATIVE mg/dL   Hgb urine dipstick NEGATIVE NEGATIVE   Bilirubin Urine NEGATIVE NEGATIVE   Ketones, ur 20 (A) NEGATIVE mg/dL   Protein, ur NEGATIVE NEGATIVE mg/dL   Nitrite NEGATIVE NEGATIVE   Leukocytes,Ua NEGATIVE NEGATIVE    Comment: Performed at [facility], [address], [place], [place] [address]  CBC     Status: Abnormal   Collection Time: 10/14/23  7:59 PM  Result Value Ref Range   WBC 9.5 4.0 - 10.5 K/uL   RBC 3.62 (L) 3.87 - 5.11 MIL/uL   Hemoglobin 10.9 (L) 12.0 - 15.0 g/dL   HCT 66.9 (L) 63.9 - 53.9 %  MCV 91.2 80.0 - 100.0 fL   MCH 30.1 26.0 - 34.0 pg   MCHC 33.0 30.0 - 36.0 g/dL   RDW 84.8 88.4 - 84.4 %   Platelets 169 150 - 400 K/uL   nRBC 0.0 0.0 - 0.2 %    Comment: Performed at [facility], [address] [place], [place] [address]  Comprehensive metabolic panel     Status: Abnormal   Collection Time: 10/14/23  7:59 PM  Result Value Ref Range   Sodium 131 (L) 135 - 145 mmol/L   Potassium 3.6 3.5 - 5.1 mmol/L   Chloride 100 98 - 111 mmol/L   CO2 19 (L) 22 - 32 mmol/L   Glucose, Bld 103 (H) 70 - 99 mg/dL    Comment: Glucose reference range applies only to samples taken after fasting for at least 8 hours.   BUN 7 6 - 20 mg/dL   Creatinine, Ser 9.41 0.44 - 1.00 mg/dL   Calcium 8.6 (L) 8.9 - 10.3 mg/dL   Total Protein 5.8 (L) 6.5 - 8.1 g/dL   Albumin  2.6 (L) 3.5 - 5.0 g/dL   AST 23 15 - 41 U/L   ALT 16 0 - 44 U/L   Alkaline Phosphatase 103 38 - 126 U/L   Total Bilirubin 0.7 0.0 - 1.2 mg/dL   GFR, Estimated >39 >39 mL/min     Comment: (NOTE) Calculated using the CKD-EPI Creatinine Equation (2021)    Anion gap 12 5 - 15    Comment: Performed at [facility], [address], [place], [place] [address]  CBC with Differential/Platelet     Status: Abnormal   Collection Time: 11/01/23  8:59 AM  Result Value Ref Range   WBC 7.1 3.4 - 10.8 x10E3/uL   RBC 3.56 (L) 3.77 - 5.28 x10E6/uL   Hemoglobin 11.0 (L) 11.1 - 15.9 g/dL   Hematocrit 66.4 (L) 65.9 - 46.6 %   MCV 94 79 - 97 fL   MCH 30.9 26.6 - 33.0 pg   MCHC 32.8 31.5 - 35.7 g/dL   RDW 85.3 88.2 - 84.5 %   Platelets 207 150 - 450 x10E3/uL   Neutrophils 67 Not Estab. %   Lymphs 24 Not Estab. %   Monocytes 8 Not Estab. %   Eos 1 Not Estab. %   Basos 0 Not Estab. %   Neutrophils Absolute 4.7 1.4 - 7.0 x10E3/uL   Lymphocytes Absolute 1.7 0.7 - 3.1 x10E3/uL   Monocytes Absolute 0.5 0.1 - 0.9 x10E3/uL   EOS (ABSOLUTE) 0.1 0.0 - 0.4 x10E3/uL   Basophils Absolute 0.0 0.0 - 0.2 x10E3/uL   Immature Granulocytes 0 Not Estab. %   Immature Grans (Abs) 0.0 0.0 - 0.1 x10E3/uL  Magnesium      Status: None   Collection Time: 11/01/23  8:59 AM  Result Value Ref Range   Magnesium  1.8 1.6 - 2.3 mg/dL  OB RESULT CONSOLE Group B Strep     Status: None   Collection Time: 11/15/23 12:00 AM  Result Value Ref Range   GBS Negative   CBC     Status: Abnormal   Collection Time: 12/02/23 10:34 AM  Result Value Ref Range   WBC 8.0 4.0 - 10.5 K/uL   RBC 3.77 (L) 3.87 - 5.11 MIL/uL   Hemoglobin 11.7 (L) 12.0 - 15.0 g/dL   HCT 64.7 (L) 63.9 - 53.9 %   MCV 93.4 80.0 - 100.0 fL   MCH 31.0 26.0 - 34.0 pg  MCHC 33.2 30.0 - 36.0 g/dL   RDW 85.9 88.4 - 84.4 %   Platelets 210 150 - 400 K/uL   nRBC 0.0 0.0 - 0.2 %    Comment: Performed at [facility], [address] [place], [place] [address]  RPR     Status: None   Collection Time: 12/02/23 10:34 AM  Result Value Ref Range   RPR Ser Ql NON REACTIVE NON REACTIVE    Comment: Performed at [facility],  [address] [place], [place] [address]  Type and screen [facility]     Status: None   Collection Time: 12/02/23 10:38 AM  Result Value Ref Range   ABO/RH(D) O POS    Antibody Screen NEG    Sample Expiration      12/05/2023,2359 Performed at [facility], [address], [place], [place] [address]   CBC     Status: Abnormal   Collection Time: 12/06/23  7:29 AM  Result Value Ref Range   WBC 10.3 4.0 - 10.5 K/uL   RBC 3.07 (L) 3.87 - 5.11 MIL/uL   Hemoglobin 9.5 (L) 12.0 - 15.0 g/dL   HCT 70.9 (L) 63.9 - 53.9 %   MCV 94.5 80.0 - 100.0 fL   MCH 30.9 26.0 - 34.0 pg   MCHC 32.8 30.0 - 36.0 g/dL   RDW 86.0 88.4 - 84.4 %   Platelets 169 150 - 400 K/uL   nRBC 0.0 0.0 - 0.2 %    Comment: Performed at [facility], [address], [place], [place] [address]     A/P:  POD1 s/p rCS, doing well pp. AFVSS. Benign exam. Hypothyroidism - on synthroid  Acute blood loss anemia, not clinically significant - will start oral iron Plan for d/c POD#2 or 3.   Slater Door, MD

## 2023-12-07 NOTE — Plan of Care (Signed)
  Problem: Education: Goal: Knowledge of General Education information will improve Description: Including pain rating scale, medication(s)/side effects and non-pharmacologic comfort measures Outcome: Progressing   Problem: Health Behavior/Discharge Planning: Goal: Ability to manage health-related needs will improve Outcome: Progressing   Problem: Clinical Measurements: Goal: Ability to maintain clinical measurements within normal limits will improve Outcome: Progressing Goal: Will remain free from infection Outcome: Progressing Goal: Diagnostic test results will improve Outcome: Progressing Goal: Respiratory complications will improve Outcome: Progressing Goal: Cardiovascular complication will be avoided Outcome: Progressing   Problem: Activity: Goal: Risk for activity intolerance will decrease Outcome: Progressing   Problem: Nutrition: Goal: Adequate nutrition will be maintained Outcome: Progressing   Problem: Coping: Goal: Level of anxiety will decrease Outcome: Progressing   Problem: Elimination: Goal: Will not experience complications related to bowel motility Outcome: Progressing Goal: Will not experience complications related to urinary retention Outcome: Progressing   Problem: Pain Managment: Goal: General experience of comfort will improve and/or be controlled Outcome: Progressing   Problem: Safety: Goal: Ability to remain free from injury will improve Outcome: Progressing   Problem: Skin Integrity: Goal: Risk for impaired skin integrity will decrease Outcome: Progressing   Problem: Education: Goal: Knowledge of condition will improve Outcome: Progressing Goal: Individualized Educational Video(s) Outcome: Progressing Goal: Individualized Newborn Educational Video(s) Outcome: Progressing   Problem: Activity: Goal: Will verbalize the importance of balancing activity with adequate rest periods Outcome: Progressing Goal: Ability to tolerate increased  activity will improve Outcome: Progressing   Problem: Coping: Goal: Ability to identify and utilize available resources and services will improve Outcome: Progressing   Problem: Life Cycle: Goal: Chance of risk for complications during the postpartum period will decrease Outcome: Progressing   Problem: Role Relationship: Goal: Ability to demonstrate positive interaction with newborn will improve Outcome: Progressing   Problem: Skin Integrity: Goal: Demonstration of wound healing without infection will improve Outcome: Progressing   Problem: Education: Goal: Knowledge of the prescribed therapeutic regimen will improve Outcome: Progressing Goal: Understanding of sexual limitations or changes related to disease process or condition will improve Outcome: Progressing Goal: Individualized Educational Video(s) Outcome: Progressing   Problem: Self-Concept: Goal: Communication of feelings regarding changes in body function or appearance will improve Outcome: Progressing   Problem: Skin Integrity: Goal: Demonstration of wound healing without infection will improve Outcome: Progressing

## 2023-12-07 NOTE — Progress Notes (Signed)
 Subjective: Postpartum Day 2: Cesarean Delivery Patient reports incisional pain, tolerating PO, + flatus, and no problems voiding.    Objective: Vital signs in last 24 hours: Temp:  [97.5 F (36.4 C)-97.8 F (36.6 C)] 97.6 F (36.4 C) (08/06 0537) Pulse Rate:  [60-74] 60 (08/06 0537) Resp:  [18-19] 18 (08/06 0537) BP: (102-121)/(67-72) 102/68 (08/06 0537) SpO2:  [98 %-100 %] 98 % (08/06 0537)  Physical Exam:  General: alert, cooperative, appears stated age, and no distress Lochia: appropriate Uterine Fundus: firm Incision: healing well DVT Evaluation: No evidence of DVT seen on physical exam.  Recent Labs    12/06/23 0729  HGB 9.5*  HCT 29.0*    Assessment/Plan: Status post Cesarean section. Doing well postoperatively.  Continue current care Anticipate d/c in am.  Alm JAYSON Cook, MD 12/07/2023, 9:30 AM

## 2023-12-07 NOTE — Progress Notes (Signed)
 [name] was referred for history of depression/anxiety.  * Referral screened out by Clinical Social Worker because none of the following criteria appear to apply:  ~ History of anxiety/depression during this pregnancy, or of post-partum depression following prior delivery.  ~ Diagnosis of anxiety and/or depression within last 3 years  Per OB notes, [name] did not indicate any signs/symptoms during her pregnancy. Per chart review anxiety and depression dates back to 2012 Edinburgh score 1  OR  * [name]'s symptoms currently being treated with medication and/or therapy.  Please contact the Clinical Social Worker if needs arise, by [facility] request, or if [name] scores greater than 9/yes to question 10 on Edinburgh Postpartum Depression Screen.    Brittany Johnston, [place] Clinical Social Worker [phone]

## 2023-12-08 MED ORDER — ACETAMINOPHEN 500 MG PO TABS: 1000.0000 mg | ORAL_TABLET | Freq: Four times a day (QID) | ORAL | 0 refills | Status: DC | PRN

## 2023-12-08 MED ORDER — OXYCODONE HCL 5 MG PO TABS: 5.0000 mg | ORAL_TABLET | Freq: Four times a day (QID) | ORAL | 0 refills | Status: DC | PRN

## 2023-12-08 MED ORDER — IBUPROFEN 600 MG PO TABS: 600.0000 mg | ORAL_TABLET | Freq: Four times a day (QID) | ORAL | 0 refills | Status: DC | PRN

## 2023-12-08 MED ORDER — LIDOCAINE 5 % EX PTCH
1.0000 | MEDICATED_PATCH | CUTANEOUS | 0 refills | Status: DC
Start: 2023-12-08 — End: 2024-01-30

## 2023-12-08 NOTE — Progress Notes (Signed)
 CTSP re: rt clavicular Fx of newborn baby. D/W clavicular fractures. D/W VE at cesarean section. All questions answered. She states she understands and feels better after our conversation.

## 2023-12-08 NOTE — Progress Notes (Signed)
 Subjective: Postpartum Day 3: Cesarean Delivery Patient reports incisional pain, tolerating PO, + flatus, and no problems voiding.   Sharp pain at right angle of incision with movement Objective: Vital signs in last 24 hours: Temp:  [97.6 F (36.4 C)-99.4 F (37.4 C)] 97.6 F (36.4 C) (08/07 0546) Pulse Rate:  [61-72] 61 (08/07 0546) Resp:  [18] 18 (08/07 0546) BP: (98-128)/(59-66) 128/64 (08/07 0546) SpO2:  [98 %-100 %] 98 % (08/07 0546)  Physical Exam:  General: alert, cooperative, and no distress Lochia: appropriate Uterine Fundus: firm Incision: healing well DVT Evaluation: No evidence of DVT seen on physical exam.  Recent Labs    12/06/23 0729  HGB 9.5*  HCT 29.0*    Assessment/Plan: Status post Cesarean section. Doing well postoperatively.  Discharge home with standard precautions and return to clinic in 4-6 weeks. D/W lidocaine  patch prn to right angle of incision Lynwood FORBES Clubs II, MD 12/08/2023, 7:47 AM

## 2023-12-08 NOTE — Discharge Summary (Signed)
 Postpartum Discharge Summary  Date of Service updated 12/08/23     Patient Name: Brittany Johnston DOB: [birth year] MRN: [id number]  Date of admission: 12/05/2023 Delivery date:12/05/2023 Delivering provider: MARNE KELLY NEST Date of discharge: 12/08/2023  Admitting diagnosis: Pregnancy [Z34.90] Intrauterine pregnancy: [redacted]w[redacted]d     Secondary diagnosis:  Principal Problem:   Pregnancy  Additional problems:     Discharge diagnosis: Term Pregnancy Delivered                                              Post partum procedures: Augmentation: N/A Complications: None  Hospital course: Sceduled C/S   38 y.o. yo G3P3003 at [redacted]w[redacted]d was admitted to the hospital 12/05/2023 for scheduled cesarean section with the following indication:Elective Repeat.Delivery details are as follows:  Membrane Rupture Time/Date: 10:05 AM,12/05/2023  Delivery Method:Vaginal, Vacuum (Extractor) Operative Delivery:N/A Details of operation can be found in separate operative note.  Patient had a postpartum course complicated by sharp pain at right angle of incision with movement.  She is ambulating, tolerating a regular diet, passing flatus, and urinating well. Patient is discharged home in stable condition on  12/08/23        Newborn Data: Birth date:12/05/2023 Birth time:10:07 AM Gender:Female Living status:Living Apgars:9 ,9  Weight:3930 g    Magnesium  Sulfate received: No BMZ received: No Rhophylac:No MMR: T-DaP: Flu:  RSV Vaccine received:  Transfusion:No Immunizations administered: Immunization History  Administered Date(s) Administered   MMR 01/11/2021    Physical exam  Vitals:   12/07/23 0537 12/07/23 1415 12/07/23 1937 12/08/23 0546  BP: 102/68 (!) 98/59 123/66 128/64  Pulse: 60 68 72 61  Resp: 18 18 18 18   Temp: 97.6 F (36.4 C) 98 F (36.7 C) 99.4 F (37.4 C) 97.6 F (36.4 C)  TempSrc: Axillary Axillary Oral Oral  SpO2: 98%  100% 98%  Weight:      Height:       General: alert,  cooperative, and no distress Lochia: appropriate Uterine Fundus: firm Incision: Healing well with no significant drainage DVT Evaluation: No evidence of DVT seen on physical exam. Labs: Lab Results  Component Value Date   WBC 10.3 12/06/2023   HGB 9.5 (L) 12/06/2023   HCT 29.0 (L) 12/06/2023   MCV 94.5 12/06/2023   PLT 169 12/06/2023      Latest Ref Rng & Units 10/14/2023    7:59 PM  CMP  Glucose 70 - 99 mg/dL 896   BUN 6 - 20 mg/dL 7   Creatinine 9.55 - 8.99 mg/dL 9.41   Sodium 864 - 854 mmol/L 131   Potassium 3.5 - 5.1 mmol/L 3.6   Chloride 98 - 111 mmol/L 100   CO2 22 - 32 mmol/L 19   Calcium 8.9 - 10.3 mg/dL 8.6   Total Protein 6.5 - 8.1 g/dL 5.8   Total Bilirubin 0.0 - 1.2 mg/dL 0.7   Alkaline Phos 38 - 126 U/L 103   AST 15 - 41 U/L 23   ALT 0 - 44 U/L 16    Edinburgh Score:    12/06/2023    9:04 PM  Edinburgh Postnatal Depression Scale Screening Tool  I have been able to laugh and see the funny side of things. 0  I have looked forward with enjoyment to things. 0  I have blamed myself unnecessarily when things went wrong. 0  I  have been anxious or worried for no good reason. 1  I have felt scared or panicky for no good reason. 0  Things have been getting on top of me. 0  I have been so unhappy that I have had difficulty sleeping. 0  I have felt sad or miserable. 0  I have been so unhappy that I have been crying. 0  The thought of harming myself has occurred to me. 0  Edinburgh Postnatal Depression Scale Total 1      After visit meds:  Allergies as of 12/08/2023   No Known Allergies      Medication List     STOP taking these medications    ferrous sulfate  325 (65 FE) MG tablet   Glatiramer  Acetate 40 MG/ML Sosy       TAKE these medications    acetaminophen  500 MG tablet Commonly known as: TYLENOL  Take 2 tablets (1,000 mg total) by mouth every 6 (six) hours as needed.   ibuprofen  600 MG tablet Commonly known as: ADVIL  Take 1 tablet (600 mg  total) by mouth every 6 (six) hours as needed.   levothyroxine  50 MCG tablet Commonly known as: SYNTHROID  Take 50 mcg by mouth daily before breakfast.   lidocaine  5 % Commonly known as: Lidoderm  Place 1 patch onto the skin daily. Remove & Discard patch within 12 hours or as directed by MD   oxyCODONE  5 MG immediate release tablet Commonly known as: Oxy IR/ROXICODONE  Take 1 tablet (5 mg total) by mouth every 6 (six) hours as needed for moderate pain (pain score 4-6).   PRENATAL VITAMINS PO Take 1 tablet by mouth daily.         Discharge home in stable condition Infant Feeding: Breast Infant Disposition:home with mother Discharge instruction: per After Visit Summary and Postpartum booklet. Activity: Advance as tolerated. Pelvic rest for 6 weeks.  Diet: routine diet Anticipated Birth Control: Unsure Postpartum Appointment:6 weeks Additional Postpartum F/U:  Future Appointments: Future Appointments  Date Time Provider Department Center  05/16/2024  1:00 PM Sater, Charlie LABOR, MD [facility] None   Follow up Visit:      12/08/2023 Lynwood FORBES Curlene PONCE, MD

## 2023-12-11 ENCOUNTER — Inpatient Hospital Stay (HOSPITAL_COMMUNITY): Admission: AD | Admit: 2023-12-11 | Source: Home / Self Care | Admitting: Obstetrics & Gynecology

## 2023-12-20 ENCOUNTER — Telehealth (HOSPITAL_COMMUNITY): Payer: Self-pay | Admitting: *Deleted

## 2023-12-20 NOTE — Telephone Encounter (Signed)
 12/20/2023  Name: Brittany Johnston MRN: [id number] DOB: 04/15/1986  Reason for Call:  Transition of Care Hospital Discharge Call  Contact Status: Patient Contact Status: Complete  Language assistant needed: Interpreter Mode: Interpreter Not Needed        Follow-Up Questions: Do You Have Any Concerns About Your Health As You Heal From Delivery?: No (saw OB yesterday for pain in incisional area, was given prescription for gabapentin and that has helped) Do You Have Any Concerns About Your Infants Health?: No  Edinburgh Postnatal Depression Scale:  In the Past 7 Days: I have been able to laugh and see the funny side of things.: As much as I always could I have looked forward with enjoyment to things.: As much as I ever did I have blamed myself unnecessarily when things went wrong.: No, never I have been anxious or worried for no good reason.: No, not at all I have felt scared or panicky for no good reason.: No, not at all Things have been getting on top of me.: No, I have been coping as well as ever I have been so unhappy that I have had difficulty sleeping.: Not at all I have felt sad or miserable.: Not very often I have been so unhappy that I have been crying.: No, never The thought of harming myself has occurred to me.: Never [place] Postnatal Depression Scale Total: 1  PHQ2-9 Depression Scale:     Discharge Follow-up: Edinburgh score requires follow up?: No Patient was advised of the following resources:: Support Group, Breastfeeding Support Group (declines postpartum group information via email)  Post-discharge interventions: Reviewed Newborn Safe Sleep Practices  Mliss Sieve, RN 12/20/2023 11:08

## 2023-12-27 NOTE — Telephone Encounter (Signed)
 Patient resumed Tysabri  infusions on 12/20/2023

## 2023-12-30 ENCOUNTER — Encounter: Payer: Self-pay | Admitting: Neurology

## 2024-01-08 DIAGNOSIS — O4440 Low lying placenta NOS or without hemorrhage, unspecified trimester: Secondary | ICD-10-CM | POA: Insufficient documentation

## 2024-01-08 DIAGNOSIS — O3660X0 Maternal care for excessive fetal growth, unspecified trimester, not applicable or unspecified: Secondary | ICD-10-CM | POA: Insufficient documentation

## 2024-01-08 DIAGNOSIS — O09529 Supervision of elderly multigravida, unspecified trimester: Secondary | ICD-10-CM | POA: Insufficient documentation

## 2024-01-08 DIAGNOSIS — Z98891 History of uterine scar from previous surgery: Secondary | ICD-10-CM | POA: Insufficient documentation

## 2024-01-08 DIAGNOSIS — Z87442 Personal history of urinary calculi: Secondary | ICD-10-CM | POA: Insufficient documentation

## 2024-01-16 ENCOUNTER — Other Ambulatory Visit: Payer: Self-pay

## 2024-01-16 ENCOUNTER — Encounter (HOSPITAL_COMMUNITY): Payer: Self-pay

## 2024-01-16 ENCOUNTER — Emergency Department (HOSPITAL_COMMUNITY)
Admission: EM | Admit: 2024-01-16 | Discharge: 2024-01-17 | Disposition: A | Discharge status: Home / Self Care | Attending: Emergency Medicine | Admitting: Emergency Medicine

## 2024-01-16 ENCOUNTER — Emergency Department (HOSPITAL_COMMUNITY): Admission: EM | Admit: 2024-01-16 | Discharge: 2024-01-16 | Discharge status: Absent without leave

## 2024-01-16 DIAGNOSIS — K645 Perianal venous thrombosis: Secondary | ICD-10-CM | POA: Insufficient documentation

## 2024-01-16 DIAGNOSIS — K649 Unspecified hemorrhoids: Secondary | ICD-10-CM

## 2024-01-16 DIAGNOSIS — K6289 Other specified diseases of anus and rectum: Secondary | ICD-10-CM | POA: Diagnosis present

## 2024-01-16 LAB — COMPREHENSIVE METABOLIC PANEL WITH GFR
ALT: 35 U/L (ref 0–44)
AST: 24 U/L (ref 15–41)
Albumin: 4.4 g/dL (ref 3.5–5.0)
Alkaline Phosphatase: 96 U/L (ref 38–126)
Anion gap: 13 (ref 5–15)
BUN: 16 mg/dL (ref 6–20)
CO2: 24 mmol/L (ref 22–32)
Calcium: 10 mg/dL (ref 8.9–10.3)
Chloride: 102 mmol/L (ref 98–111)
Creatinine, Ser: 0.54 mg/dL (ref 0.44–1.00)
GFR, Estimated: >60 mL/min (ref >60)
Glucose, Bld: 91 mg/dL (ref 70–99)
Potassium: 4.1 mmol/L (ref 3.5–5.1)
Sodium: 138 mmol/L (ref 135–145)
Total Bilirubin: 0.5 mg/dL (ref 0.0–1.2)
Total Protein: 7.1 g/dL (ref 6.5–8.1)

## 2024-01-16 LAB — TYPE AND SCREEN
ABO/RH(D): O POS
Antibody Screen: NEGATIVE

## 2024-01-16 LAB — CBC
HCT: 36.1 % (ref 36.0–46.0)
Hemoglobin: 12 g/dL (ref 12.0–15.0)
MCH: 30.2 pg (ref 26.0–34.0)
MCHC: 33.2 g/dL (ref 30.0–36.0)
MCV: 90.7 fL (ref 80.0–100.0)
Platelets: 221 K/uL (ref 150–400)
RBC: 3.98 MIL/uL (ref 3.87–5.11)
RDW: 12.2 % (ref 11.5–15.5)
WBC: 9.7 K/uL (ref 4.0–10.5)
nRBC: 0.5 % — ABNORMAL HIGH (ref 0.0–0.2)

## 2024-01-16 LAB — HCG, SERUM, QUALITATIVE: Preg, Serum: NEGATIVE

## 2024-01-16 NOTE — ED Triage Notes (Signed)
 Pt. Arrives for rectal bleeding from hemorrhoids. States that she is 6 months post partum and her pcp told her to come in if she is bleeding too much. States that she is not going through any pads, she is just seeping blood and a mucus like discharge that she states is coming from her vagina and her anus. Reports pain to the rectum, denies dizziness.

## 2024-01-17 MED ORDER — NITROGLYCERIN 2 % TD OINT: TOPICAL_OINTMENT | TRANSDERMAL | 0 refills | Status: AC

## 2024-01-17 NOTE — ED Provider Notes (Signed)
 [place] EMERGENCY DEPARTMENT AT [facility] Provider Note  CSN: [id number] Arrival date & time: 01/16/24 2147  Chief Complaint(s) Hemorrhoids  HPI Brittany Johnston is a 38 y.o. female {Add pertinent medical, surgical, social history, OB history to HPI:1}   HPI  Past Medical History Past Medical History:  Diagnosis Date   Anxiety    Depression    Irritable bowel syndrome    Multiple sclerosis (HCC)    Multiple sclerosis (HCC)    Vaginal Pap smear, abnormal    Vision abnormalities    Patient Active Problem List   Diagnosis Date Noted   Cognitive deficit secondary to multiple sclerosis (HCC) 07/06/2021   Liver lesion 02/14/2021   Rectal bleeding 02/14/2021   Bloating symptom 02/14/2021   Hemorrhoids 02/14/2021   Rectal pain 02/14/2021   Chronic constipation 02/14/2021   Change in bowel habits 02/14/2021   Anal fissure 02/14/2021   Other hemorrhoids 02/14/2021   Calculus of gallbladder without cholecystitis without obstruction 02/14/2021   Upper abdominal pain 02/14/2021   Pregnancy 01/09/2021   Vitamin D  deficiency 06/18/2019   Pregnant 05/21/2019   ADD (attention deficit disorder) 12/28/2017   Family planning counseling 09/19/2017   Other fatigue 09/19/2017   Depression with anxiety 01/06/2017   Ear infection 10/18/2016   Atypical face pain 10/18/2016   Numbness 08/05/2016   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 Authorizing Provider  nitroGLYCERIN  (NITROGLYN) 2 % ointment Apply a pea size amount to the perianal region (hemorrhoids) 2-3 times per day 01/17/24  Yes Manasvi Dickard, Raynell Moder, MD   acetaminophen  (TYLENOL ) 500 MG tablet Take 2 tablets (1,000 mg total) by mouth every 6 (six) hours as needed. 12/08/23   Tomblin, James, MD  ibuprofen  (ADVIL ) 600 MG tablet Take 1 tablet (600 mg total) by mouth every 6 (six) hours as needed. 12/08/23   Tomblin, James, MD  levothyroxine  (SYNTHROID ) 50 MCG tablet Take 50 mcg by mouth daily before breakfast.    [provider]  lidocaine  (LIDODERM ) 5 % Place 1 patch onto the skin daily. Remove & Discard patch within 12 hours or as directed by MD 12/08/23   Curlene Agent, MD  oxyCODONE  (OXY IR/ROXICODONE ) 5 MG immediate release tablet Take 1 tablet (5 mg total) by mouth every 6 (six) hours as needed for moderate pain (pain score 4-6). 12/08/23   Curlene Agent, MD  Prenatal Vit-Fe Fumarate-FA (PRENATAL VITAMINS PO) Take 1 tablet by mouth daily.    [provider]  Allergies Patient has no known allergies.  Review of Systems Review of Systems As noted in HPI  Physical Exam Vital Signs  I have reviewed the triage vital signs BP 112/68   Pulse 73   Temp 97.8 F (36.6 C)   Resp 18   SpO2 100%  *** Physical Exam  ED Results and Treatments Labs (all labs ordered are listed, but only abnormal results are displayed) Labs Reviewed  CBC - Abnormal; Notable for the following components:      Result Value   nRBC 0.5 (*)    All other components within normal limits  COMPREHENSIVE METABOLIC PANEL WITH GFR  HCG, SERUM, QUALITATIVE  POC OCCULT BLOOD, ED  TYPE AND SCREEN                                                                                                                         EKG  EKG Interpretation Date/Time:    Ventricular Rate:    PR Interval:    QRS Duration:    QT Interval:    QTC Calculation:   R Axis:      Text Interpretation:         Radiology No results  found.  Medications Ordered in ED Medications - No data to display Procedures Procedures  (including critical care time) Medical Decision Making / ED Course   Medical Decision Making Risk Prescription drug management.    ***    Final Clinical Impression(s) / ED Diagnoses Final diagnoses:  External hemorrhoid, thrombosed  Bleeding hemorrhoids    This chart was dictated using voice recognition software.  Despite best efforts to proofread,  errors can occur which can change the documentation meaning.

## 2024-01-17 NOTE — Discharge Instructions (Addendum)
Increase hydration and follow a high fiber diet. Use a stool softener such as Colace, DulcoLax, or MiraLax to help soften stool and relieve pain with bowel movements. Sitz baths (ask pharmacist if you need help finding this) and Epsom salt baths will also help improve symptoms. Apply topical medications to hemorrhoid twice daily. If symptoms persist longer than 1-2 weeks, please follow up with your primary care provider. Please return to ER for new or worsening symptoms, any additional concerns.  ° °

## 2024-01-30 ENCOUNTER — Encounter: Payer: Self-pay | Admitting: Student in an Organized Health Care Education/Training Program

## 2024-01-30 ENCOUNTER — Ambulatory Visit: Admitting: Student in an Organized Health Care Education/Training Program

## 2024-01-30 VITALS — BP 108/64 | HR 64 | Ht 65.0 in | Wt 186.0 lb

## 2024-01-30 DIAGNOSIS — Z0000 Encounter for general adult medical examination without abnormal findings: Secondary | ICD-10-CM | POA: Insufficient documentation

## 2024-01-30 DIAGNOSIS — G35 Multiple sclerosis: Secondary | ICD-10-CM

## 2024-01-30 DIAGNOSIS — Z789 Other specified health status: Secondary | ICD-10-CM | POA: Insufficient documentation

## 2024-01-30 DIAGNOSIS — K5909 Other constipation: Secondary | ICD-10-CM

## 2024-01-30 DIAGNOSIS — E039 Hypothyroidism, unspecified: Secondary | ICD-10-CM | POA: Diagnosis not present

## 2024-01-30 DIAGNOSIS — E611 Iron deficiency: Secondary | ICD-10-CM | POA: Diagnosis not present

## 2024-01-30 DIAGNOSIS — K649 Unspecified hemorrhoids: Secondary | ICD-10-CM | POA: Diagnosis not present

## 2024-01-30 DIAGNOSIS — N1330 Unspecified hydronephrosis: Secondary | ICD-10-CM | POA: Insufficient documentation

## 2024-01-30 NOTE — Progress Notes (Addendum)
 Complete physical exam  Patient: Brittany Johnston   DOB: 07/24/1985   38 y.o. Female  MRN: [id number]  Subjective:    Chief Complaint  Patient presents with   Establish Care    Hemorrhoids having trouble and has been referred for surgery      Brittany Johnston is a 38 y.o. female who presents today for a complete physical exam. She reports consuming a general diet. She generally feels well. She reports sleeping well. She does have additional problems to discuss today.   Discussed the use of AI scribe software for clinical note transcription with the patient, who gave verbal consent to proceed.  History of Present Illness Brittany Johnston is a 38 year old female with multiple sclerosis who presents for primary care establishment and management of hemorrhoids.  She has a history of hemorrhoids, which worsened during her recent third pregnancy due to increased weight gain and age. Postpartum, she experienced significant bleeding from hemorrhoids, even without bowel movements, leading to an ER visit two weeks ago. She currently uses nitroglycerin  cream, initially daily for four days, now only after bowel movements, and takes Miralax  daily. She also uses sitz baths and Preparation H suppositories. She reports that the bleeding is not as bad as it was two weeks ago and that she is not currently experiencing pain.  She has a history of constipation, having undergone a colonoscopy at age 31. Despite taking Miralax  daily, she experiences bowel movements every other day. She has not used Proctofoam or similar steroid foams.  She was diagnosed with multiple sclerosis in 2018 after experiencing optic neuritis and losing vision in her left eye, which has since returned. She is on Tysabri  infusions monthly and has not had any flare-ups since starting this treatment. During pregnancies, she switches to a different medication administered via shots at home.  She was started on levothyroxine  during her  recent pregnancy due to thyroid  function changes, taking a 50 microgram dose. She also takes an iron supplement, as recommended by her OB and neurologist, due to previous blood work results.  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 She lives in [place] with her husband and is close to family. She has three children and reports no issues with alcohol or drug use. She is working on improving her diet, particularly to manage constipation, and has recently started walking for exercise.  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 She also has a history of multiple skin lesions removed by a dermatologist. She wears glasses for driving due to distance vision issues, with some residual effects from past optic neuritis.    Most recent fall risk assessment:    01/30/2024    2:26 PM  Fall Risk   Falls in the past year? 0  Number falls in past yr: 0  Injury with Fall? 0  Risk for fall due to : No Fall Risks  Follow up Falls evaluation completed     Most recent depression screenings:    01/30/2024    2:25 PM  PHQ 2/9 Scores  PHQ - 2 Score 0  PHQ- 9 Score 0    Past Medical History:  Diagnosis Date   Abnormal auditory perception of both ears 06/20/2018   ADD (attention deficit disorder) 12/28/2017   Anxiety    Calculus of gallbladder without cholecystitis without obstruction 02/14/2021   Depression  History of cesarean section 01/08/2024   G1= 05/21/19, 39.5wga, SROM, c/s for arrest of descent, F, Evie Jane, 8#2oz, [place] (had pp exploratory laparotomy/cystoscopy d/t incisional bleeding)///G2= 01/09/21, 39wga, sched rpt c/s, M, [place], [place]     History of renal calculi 01/08/2024   passed 1, still has 1     Irritable bowel syndrome    Multiple sclerosis    Multiple sclerosis    Optic neuritis 06/28/2016   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   levothyroxine  (SYNTHROID ) 50 MCG tablet Take 50 mcg by mouth daily before breakfast.   natalizumab  (TYSABRI ) 300 MG/15ML injection Infuse 15 mL every 4 weeks by intravenous route.   nitroGLYCERIN  (NITROGLYN) 2 % ointment Apply a pea size amount to the perianal region (hemorrhoids) 2-3 times per day   Prenatal Vit-Fe Fumarate-FA (PRENATAL VITAMINS PO) Take 1 tablet by mouth daily.   [DISCONTINUED] acetaminophen  (TYLENOL ) 500 MG tablet Take 2 tablets (1,000 mg total) by mouth every 6 (six) hours as needed.   [DISCONTINUED] Cholecalciferol (VITAMIN D3) 250 MCG (10000 UT) TABS Take 1 tablet by mouth daily.   [DISCONTINUED] ibuprofen  (ADVIL ) 600 MG tablet Take 1 tablet (600 mg total) by mouth every 6 (six) hours as needed.   [DISCONTINUED] lidocaine  (LIDODERM ) 5 % Place 1 patch onto the skin daily. Remove & Discard patch within 12 hours or as directed by MD (Patient not taking: Reported on 01/30/2024)   [DISCONTINUED] oxyCODONE  (OXY IR/ROXICODONE ) 5 MG immediate release tablet Take 1 tablet (5 mg total) by mouth every 6 (six) hours as needed for moderate pain (pain score 4-6). (Patient not taking: Reported on 01/30/2024)   No facility-administered medications prior to visit.       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  No active bleeding. Ext: Warm, no edema, normal joints Neuro: Alert, conversational, full strength upper and lower extremities, normal get up and go, normal gait and balance Psych: Appropriate mood and affect, pleasant to talk with, not depressed or anxious appearing      Assessment & Plan:    Routine Health Maintenance and Physical Exam  Immunization History  Administered Date(s) Administered   Influenza,inj,Quad PF,6+ Mos 02/20/2019   MMR 01/11/2021   Tdap 02/22/2019, 10/21/2020, 09/13/2023    Health Maintenance  Topic Date Due   COVID-19 Vaccine (1) Never done   Hepatitis B Vaccines 19-59 Average Risk (1 of 3 - 19+ 3-dose series) Never done   HPV VACCINES (1 - 3-dose SCDM series) Never done   Cervical Cancer Screening (HPV/Pap Cotest)  Never done  Influenza Vaccine  12/02/2023   DTaP/Tdap/Td (4 - Td or Tdap) 09/12/2033   Hepatitis C Screening  Completed   HIV Screening  Completed   Pneumococcal Vaccine  Aged Out   Meningococcal B Vaccine  Aged Out    Discussed health benefits of physical activity, and encouraged her to engage in regular exercise appropriate for her age and condition.   Problem List Items Addressed This Visit       High   Multiple sclerosis   Managed by Dr. Vear in neurology. Diagnosed in 2018, her multiple sclerosis is well-managed on Tysabri  with no recent flare-ups. She has a history of optic neuritis with resolved vision loss.      Relevant Medications   natalizumab  (TYSABRI ) 300 MG/15ML injection     Medium    Hemorrhoids   Chronic hemorrhoids, worsened by recent pregnancy, have shown improvement with nitroglycerin  cream and home remedies. There is no current pain or active bleeding. Surgery consultation planned for later this week. Continue sitz baths and  Preparation H suppositories. Discontinue nitroglycerin  cream unless pain recurs, no signs of anal fissure currently or thrombosis. Encourage good bowel habits to prevent constipation and limit time on the toilet to reduce pressure on hemorrhoids.       Chronic constipation   Long-standing constipation, worsened by recent pregnancy, is managed with daily Miralax , resulting in bowel movements every other day. Iron supplementation may contribute to constipation. Continue daily Miralax , increasing to two doses if needed for daily bowel movements. Check iron studies to assess the need for continued iron supplementation. Encourage dietary modifications to improve bowel regularity.        Low   Health maintenance examination - Primary   Healthy young person about 8 weeks postpartum.  Recovering very well.  Up-to-date on vaccinations.  Up-to-date on cervical cancer screening.  No issues with mood or substance use.  Weight is coming down postpartum.  Working on increasing exercise, working on Sales promotion account executive.      Hypothyroidism   Started on levothyroxine  during pregnancy, her current need for medication is uncertain pending thyroid  function tests. Order TSH. Considering discontinuing levothyroxine  based on lab results. Recheck thyroid  function in 8 weeks if levothyroxine  is discontinued.      Relevant Orders   TSH   T4, free   Iron deficiency   Relevant Orders   IBC + Ferritin   No follow-ups on file.     Cleatus Debby Specking, MD

## 2024-01-30 NOTE — Assessment & Plan Note (Signed)
 Started on levothyroxine  during pregnancy, her current need for medication is uncertain pending thyroid  function tests. Order TSH. Considering discontinuing levothyroxine  based on lab results. Recheck thyroid  function in 8 weeks if levothyroxine  is discontinued.

## 2024-01-30 NOTE — Assessment & Plan Note (Signed)
 Chronic hemorrhoids, worsened by recent pregnancy, have shown improvement with nitroglycerin  cream and home remedies. There is no current pain or active bleeding. Surgery consultation planned for later this week. Continue sitz baths and Preparation H suppositories. Discontinue nitroglycerin  cream unless pain recurs, no signs of anal fissure currently or thrombosis. Encourage good bowel habits to prevent constipation and limit time on the toilet to reduce pressure on hemorrhoids.

## 2024-01-30 NOTE — Patient Instructions (Addendum)
  VISIT SUMMARY: Today, you came in for a primary care visit to establish care and manage your hemorrhoids. We discussed your history of hemorrhoids, which worsened during your recent pregnancy, and your current treatment plan. We also reviewed your chronic constipation, multiple sclerosis, and hypothyroidism. You are currently managing your hemorrhoids with nitroglycerin  cream, sitz baths, and Preparation H suppositories. Your multiple sclerosis is stable on Tysabri , and we are monitoring your thyroid  function to determine if you need to continue levothyroxine .  YOUR PLAN: -HEMORRHOIDS WITH RECENT THROMBOSIS AND BLEEDING: Hemorrhoids are swollen veins in the lower rectum or anus. Your hemorrhoids have improved with the use of nitroglycerin  cream and home remedies. Continue using sitz baths and Preparation H suppositories. You can stop using the nitroglycerin  cream unless you experience pain again. Maintain good bowel habits to prevent constipation and limit the time you spend on the toilet to reduce pressure on your hemorrhoids. If your symptoms worsen, we may need to consider surgery and consult with a surgeon.  -CHRONIC CONSTIPATION: Constipation is when you have infrequent or difficult bowel movements. Your constipation, which has been ongoing and worsened by your recent pregnancy, is currently managed with daily Miralax . Continue taking Miralax  daily, and you can increase to two doses if needed to achieve daily bowel movements. We will check your iron levels to see if you need to continue taking the iron supplement, as it may be contributing to your constipation. Make dietary changes to help improve your bowel regularity.  -MULTIPLE SCLEROSIS, STABLE ON TYSABRI : Multiple sclerosis is a disease where the immune system attacks the protective covering of nerves. Your condition is stable with no recent flare-ups while on Tysabri . Continue with your current treatment plan.  -HYPOTHYROIDISM, STATUS POST  PREGNANCY, ON LEVOTHYROXINE  (PENDING LABS): Hypothyroidism is when your thyroid  gland doesn't produce enough thyroid  hormone. You started taking levothyroxine  during your pregnancy. We will check your thyroid  function with lab tests to determine if you need to continue the medication. If the lab results suggest you no longer need it, we will discontinue levothyroxine  and recheck your thyroid  function in 8 weeks.  INSTRUCTIONS: Please follow up with the recommended lab tests for your thyroid  function. If your hemorrhoid symptoms worsen, contact us  to discuss the possibility of surgery and a consultation with a surgeon. Continue with your current medications and dietary changes to manage your constipation. Recheck your thyroid  function in 8 weeks if we discontinue levothyroxine  based on your lab results.

## 2024-01-30 NOTE — Assessment & Plan Note (Signed)
 Managed by Dr. Vear in neurology. Diagnosed in 2018, her multiple sclerosis is well-managed on Tysabri  with no recent flare-ups. She has a history of optic neuritis with resolved vision loss.

## 2024-01-30 NOTE — Assessment & Plan Note (Signed)
 Long-standing constipation, worsened by recent pregnancy, is managed with daily Miralax , resulting in bowel movements every other day. Iron supplementation may contribute to constipation. Continue daily Miralax , increasing to two doses if needed for daily bowel movements. Check iron studies to assess the need for continued iron supplementation. Encourage dietary modifications to improve bowel regularity.

## 2024-01-30 NOTE — Assessment & Plan Note (Signed)
 Healthy young person about 8 weeks postpartum.  Recovering very well.  Up-to-date on vaccinations.  Up-to-date on cervical cancer screening.  No issues with mood or substance use.  Weight is coming down postpartum.  Working on increasing exercise, working on Sales promotion account executive.

## 2024-01-31 LAB — TSH: TSH: 2.25 u[IU]/mL (ref 0.35–5.50)

## 2024-01-31 LAB — T4, FREE: Free T4: 0.75 ng/dL (ref 0.60–1.60)

## 2024-01-31 LAB — IBC + FERRITIN
Ferritin: 44.9 ng/mL (ref 10.0–291.0)
Iron: 59 ug/dL (ref 42–145)
Saturation Ratios: 16.5 % — ABNORMAL LOW (ref 20.0–50.0)
TIBC: 357 ug/dL (ref 250.0–450.0)
Transferrin: 255 mg/dL (ref 212.0–360.0)

## 2024-02-01 ENCOUNTER — Ambulatory Visit: Payer: Self-pay | Admitting: Student in an Organized Health Care Education/Training Program

## 2024-02-01 NOTE — Telephone Encounter (Signed)
 Patient called and scheduled an appointment for 11/18

## 2024-02-03 ENCOUNTER — Ambulatory Visit: Admitting: Student in an Organized Health Care Education/Training Program

## 2024-02-07 ENCOUNTER — Encounter: Payer: Self-pay | Admitting: Neurology

## 2024-02-24 ENCOUNTER — Ambulatory Visit: Admitting: Physician Assistant

## 2024-03-12 NOTE — Progress Notes (Unsigned)
 Chief Complaint: Black spot on hemorrhoids  HPI:    Brittany Johnston is a 38 year old female with a past medical history as listed below including MS, depression, anxiety and multiple others, known to Dr. Wilhelmenia, who was referred to me by Jerrell Cleatus Ned,* for a complaint of black spot on hemorrhoids.      02/13/2021 office visit with Dr. Wilhelmenia.  At that time fecal elastase testing, H. pylori stool imaging, started on Nexium  40 daily, given RectiCare 4 times a day after a bowel movement, Linzess 145 mcg, continue MiraLAX , MRI of the liver.    02/01/2024 patient seen by general surgery for external hemorrhoid.  At that time noted to have relatively small external hemorrhoid no bleeding.  Small thrombosis in the left side.  Posterior internal hemorrhoid a little inflammation.  Did not tolerate anoscopy.  Recommended continued MiraLAX  and Anusol suppositories.  GI Procedures and Studies  We do not have access to the following: Previous EGD/colonoscopy at [facility] at age 30  Repeat endoscopy elsewhere approximately 10 years ago   Laboratory Studies  Reviewed those in epic and care everywhere   Imaging Studies  October 2022 abdominal ultrasound right upper quadrant IMPRESSION: 1. Cholelithiasis without evidence of acute cholecystitis. 2. Hyperechoic lesion in the liver measuring up to 2.4 cm may reflect a hemangioma. Recommend MRI of the abdomen with and without contrast for definitive characterization.  September 2022 CT abdomen pelvis IMPRESSION: Changes consistent with recent cesarean section. Mild inflammatory changes are noted along the surgical scar although no abscess is seen. Fluid is noted within the endometrial canal of uncertain significance no findings to suggest focal abscess are identified. Nonobstructing left renal calculi. Fatty liver. Hypodensities are noted within the liver which correspond to that seen on recent ultrasound and likely  represent hemangiomas but incompletely evaluated on this exam. Again MRI is recommended on a nonemergent basis for further characterization. Known cholelithiasis is not appreciated on this study.      Past Medical History:  Diagnosis Date   Abnormal auditory perception of both ears 06/20/2018   ADD (attention deficit disorder) 12/28/2017   Anxiety    Calculus of gallbladder without cholecystitis without obstruction 02/14/2021   Depression    History of cesarean section 01/08/2024   G1= 05/21/19, 39.5wga, SROM, c/s for arrest of descent, F, Evie Jane, 8#2oz, [place] (had pp exploratory laparotomy/cystoscopy d/t incisional bleeding)///G2= 01/09/21, 39wga, sched rpt c/s, M, Blake, [place]     History of renal calculi 01/08/2024   passed 1, still has 1     Irritable bowel syndrome    Multiple sclerosis    Multiple sclerosis    Optic neuritis 06/28/2016   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   Vaginal Pap smear, abnormal    Vision abnormalities     Past Surgical History:  Procedure Laterality Date   CESAREAN SECTION N/A 05/21/2019   Procedure: CESAREAN SECTION;  Surgeon: Marne Kelly Nest, MD;  Location: [facility] LD ORS;  Service: Obstetrics;  Laterality: N/A;   CESAREAN SECTION N/A 01/09/2021   Procedure: REPEAT CESAREAN SECTION EDC: 01-16-21 ALLERG: NKDA  PREVIOUS X 1;  Surgeon: Marne Kelly Nest, MD;  Location: [facility] LD ORS;  Service: Obstetrics;  Laterality: N/A;   CESAREAN SECTION N/A 12/05/2023   Procedure: CESAREAN DELIVERY;  Surgeon: Marne Kelly Nest, MD;  Location: [facility] LD ORS;  Service: Obstetrics;  Laterality: N/A;   CYSTOSCOPY N/A 05/22/2019   Procedure: CYSTOSCOPY;  Surgeon: Marne Kelly Nest, MD;  Location: [facility] LD ORS;  Service:  Gynecology;  Laterality: N/A;   LAPAROTOMY N/A 05/22/2019   Procedure: LAPAROTOMY;  Surgeon: Marne Kelly Nest, MD;  Location: [facility] LD ORS;  Service: Gynecology;  Laterality: N/A;   LEEP     suppose to have follow up of cervical cells in  January   MYRINGOTOMY     TYMPANOPLASTY     WISDOM TOOTH EXTRACTION      Current Outpatient Medications  Medication Sig Dispense Refill   ferrous sulfate  325 (65 FE) MG EC tablet Take 325 mg by mouth.     levothyroxine  (SYNTHROID ) 50 MCG tablet Take 50 mcg by mouth daily before breakfast.     natalizumab  (TYSABRI ) 300 MG/15ML injection Infuse 15 mL every 4 weeks by intravenous route.     nitroGLYCERIN  (NITROGLYN) 2 % ointment Apply a pea size amount to the perianal region (hemorrhoids) 2-3 times per day 30 g 0   Prenatal Vit-Fe Fumarate-FA (PRENATAL VITAMINS PO) Take 1 tablet by mouth daily.     No current facility-administered medications for this visit.    Allergies as of 03/13/2024   (No Known Allergies)    Family History  Problem Relation Age of Onset   Heart disease Mother    Colon polyps Father    Prostate cancer Father    Diabetes Maternal Grandmother    Leukemia Paternal Grandfather    Colon cancer Neg Hx    Esophageal cancer Neg Hx    Inflammatory bowel disease Neg Hx    Liver disease Neg Hx    Pancreatic cancer Neg Hx    Rectal cancer Neg Hx     Social History   Socioeconomic History   Marital status: Married    Spouse name: Not on file   Number of children: Not on file   Years of education: Not on file   Highest education level: Not on file  Occupational History   Not on file  Tobacco Use   Smoking status: Never   Smokeless tobacco: Never  Vaping Use   Vaping status: Never Used  Substance and Sexual Activity   Alcohol use: Not Currently    Comment: occasional   Drug use: No   Sexual activity: Yes    Birth control/protection: None  Other Topics Concern   Not on file  Social History Narrative   Not on file   Social Drivers of Health   Financial Resource Strain: Low Risk  (08/29/2023)   Received from [facility]   Overall Financial Resource Strain (CARDIA)    Difficulty of Paying Living Expenses: Not hard at all  Food Insecurity: No Food  Insecurity (12/07/2023)   Hunger Vital Sign    Worried About Running Out of Food in the Last Year: Never true    Ran Out of Food in the Last Year: Never true  Transportation Needs: No Transportation Needs (12/07/2023)   PRAPARE - Administrator, Civil Service (Medical): No    Lack of Transportation (Non-Medical): No  Physical Activity: Not on file  Stress: Not on file  Social Connections: Unknown (09/11/2021)   Received from [facility]   Social Network    Social Network: Not on file  Intimate Partner Violence: Not At Risk (12/07/2023)   Humiliation, Afraid, Rape, and Kick questionnaire    Fear of Current or Ex-Partner: No    Emotionally Abused: No    Physically Abused: No    Sexually Abused: No    Review of Systems:    Constitutional: No weight loss, fever, chills,  weakness or fatigue HEENT: Eyes: No change in vision               Ears, Nose, Throat:  No change in hearing or congestion Skin: No rash or itching Cardiovascular: No chest pain, chest pressure or palpitations   Respiratory: No SOB or cough Gastrointestinal: See HPI and otherwise negative Genitourinary: No dysuria or change in urinary frequency Neurological: No headache, dizziness or syncope Musculoskeletal: No new muscle or joint pain Hematologic: No bleeding or bruising Psychiatric: No history of depression or anxiety    Physical Exam:  Vital signs: There were no vitals taken for this visit.  Constitutional:   Pleasant Caucasian female appears to be in NAD, Well developed, Well nourished, alert and cooperative Head:  Normocephalic and atraumatic. Eyes:   PEERL, EOMI. No icterus. Conjunctiva pink. Ears:  Normal auditory acuity. Neck:  Supple Throat: Oral cavity and pharynx without inflammation, swelling or lesion.  Respiratory: Respirations even and unlabored. Lungs clear to auscultation bilaterally.   No wheezes, crackles, or rhonchi.  Cardiovascular: Normal S1, S2. No MRG. Regular rate and rhythm.  No peripheral edema, cyanosis or pallor.  Gastrointestinal:  Soft, nondistended, nontender. No rebound or guarding. Normal bowel sounds. No appreciable masses or hepatomegaly. Rectal:  Not performed.  Msk:  Symmetrical without gross deformities. Without edema, no deformity or joint abnormality.  Neurologic:  Alert and  oriented x4;  grossly normal neurologically.  Skin:   Dry and intact without significant lesions or rashes. Psychiatric: Oriented to person, place and time. Demonstrates good judgement and reason without abnormal affect or behaviors.  RELEVANT LABS AND IMAGING: CBC    Component Value Date/Time   WBC 9.7 01/16/2024 2221   RBC 3.98 01/16/2024 2221   HGB 12.0 01/16/2024 2221   HGB 11.0 (L) 11/01/2023 0859   HCT 36.1 01/16/2024 2221   HCT 33.5 (L) 11/01/2023 0859   PLT 221 01/16/2024 2221   PLT 207 11/01/2023 0859   MCV 90.7 01/16/2024 2221   MCV 94 11/01/2023 0859   MCH 30.2 01/16/2024 2221   MCHC 33.2 01/16/2024 2221   RDW 12.2 01/16/2024 2221   RDW 14.6 11/01/2023 0859   LYMPHSABS 1.7 11/01/2023 0859   MONOABS 0.6 11/26/2020 0026   EOSABS 0.1 11/01/2023 0859   BASOSABS 0.0 11/01/2023 0859    CMP     Component Value Date/Time   NA 138 01/16/2024 2221   NA 137 02/12/2019 0850   K 4.1 01/16/2024 2221   CL 102 01/16/2024 2221   CO2 24 01/16/2024 2221   GLUCOSE 91 01/16/2024 2221   BUN 16 01/16/2024 2221   BUN 7 02/12/2019 0850   CREATININE 0.54 01/16/2024 2221   CALCIUM 10.0 01/16/2024 2221   PROT 7.1 01/16/2024 2221   PROT 5.9 (L) 02/12/2019 0850   ALBUMIN  4.4 01/16/2024 2221   ALBUMIN  3.5 (L) 02/12/2019 0850   AST 24 01/16/2024 2221   ALT 35 01/16/2024 2221   ALKPHOS 96 01/16/2024 2221   BILITOT 0.5 01/16/2024 2221   BILITOT <0.2 02/12/2019 0850   GFRNONAA >60 01/16/2024 2221   GFRAA >60 04/27/2019 1804    Assessment: 1.  Hemorrhoids:  Plan: 1. ***     Delon Failing, PA-C [facility] 03/12/2024, 11:23 AM  Cc: Jerrell Solian [facility]

## 2024-03-13 ENCOUNTER — Ambulatory Visit: Admitting: Physician Assistant

## 2024-03-13 ENCOUNTER — Encounter: Payer: Self-pay | Admitting: Physician Assistant

## 2024-03-13 VITALS — BP 100/66 | HR 88 | Ht 65.0 in | Wt 175.4 lb

## 2024-03-13 DIAGNOSIS — K6289 Other specified diseases of anus and rectum: Secondary | ICD-10-CM

## 2024-03-13 DIAGNOSIS — K640 First degree hemorrhoids: Secondary | ICD-10-CM | POA: Diagnosis not present

## 2024-03-13 NOTE — Patient Instructions (Signed)
 Follow up as needed

## 2024-03-13 NOTE — Progress Notes (Signed)
 Attending Physician's Attestation   I have reviewed the chart.   I agree with the Advanced Practitioner's note, impression, and recommendations with any updates as below. If issues occur again, she can reach out and we will set up for colonoscopy update.    Aloha Finner, MD [facility] Advanced Endoscopy Office # [phone]

## 2024-03-20 ENCOUNTER — Encounter: Payer: Self-pay | Admitting: Student in an Organized Health Care Education/Training Program

## 2024-03-20 ENCOUNTER — Ambulatory Visit: Admitting: Student in an Organized Health Care Education/Training Program

## 2024-03-20 VITALS — BP 122/78 | HR 93 | Wt 175.0 lb

## 2024-03-20 DIAGNOSIS — E663 Overweight: Secondary | ICD-10-CM | POA: Diagnosis not present

## 2024-03-20 DIAGNOSIS — E039 Hypothyroidism, unspecified: Secondary | ICD-10-CM | POA: Diagnosis not present

## 2024-03-20 DIAGNOSIS — E611 Iron deficiency: Secondary | ICD-10-CM | POA: Diagnosis not present

## 2024-03-20 NOTE — Progress Notes (Signed)
   Established Patient Office Visit  Patient ID: Brittany Johnston, female    DOB: 12/09/1985  Age: 38 y.o. MRN: [id number] PCP: Jerrell Cleatus Ned, MD  Chief Complaint  Patient presents with   Medication Management    8 week follow up     Subjective:     HPI  Discussed the use of AI scribe software for clinical note transcription with the patient, who gave verbal consent to proceed.  History of Present Illness Brittany Johnston is a 38 year old female who presents for follow-up on thyroid  and iron levels.  She sometimes forgets to take her iron pill but feels good overall. She has been taking semaglutide for four weeks without any side effects and has not increased the dose due to concerns about constipation.  There is a significant improvement in her hemorrhoid condition, which previously required a surgical consultation.  Recently, she has experienced lightheadedness upon standing, particularly in the mornings before eating. This symptom has been present for the last couple of days. No lightheadedness during afternoon activities or exercise, and no shortness of breath or fatigue.  She has lost weight since starting semaglutide, going from 179 pounds to 171 pounds, although she is unsure of the accuracy of her home scale. She is currently taking a daily women's multivitamin but no additional supplements containing biotin.     Objective:     BP 122/78 (Patient Position: Standing)   Pulse 93   LMP 02/17/2024 (Approximate)   Physical Exam  Gen: Well-appearing woman.  We did orthostatic vitals because of some lightheadedness on standing and these were normal. Neck: Normal thyroid , no nodules Heart: Regular, no murmur Lungs: Unlabored, clear throughout    Assessment & Plan:   Problem List Items Addressed This Visit       High   Overweight (BMI 25.0-29.9) (Chronic)   Semaglutide was started four weeks ago with no side effects from a pharmacologist. She prefers the  current dose to avoid constipation. The current dose of semaglutide will continue, with weight loss monitored and dose adjustments made if necessary. Continued physical activity is encouraged.        Low   Hypothyroidism (Chronic)   History of hypothyroidism during pregnancy, now well into the postpartum period.  We discontinued levothyroxine  about 2 months ago.  Will recheck TSH and free T4 today.  No symptoms of hypothyroidism currently.      Relevant Orders   TSH   T4, free   Iron deficiency - Primary (Chronic)   Recent history of iron deficiency due to pregnancy.  Currently using iron supplementation.  Still having some symptoms of lightheadedness which may or may not be related.  Will check iron levels today but likely can discontinue the iron supplements soon.      Relevant Orders   CBC   IBC + Ferritin     Return in about 6 months (around 09/17/2024).    Cleatus Ned Jerrell, MD [place] [facility]

## 2024-03-20 NOTE — Assessment & Plan Note (Signed)
 History of hypothyroidism during pregnancy, now well into the postpartum period.  We discontinued levothyroxine  about 2 months ago.  Will recheck TSH and free T4 today.  No symptoms of hypothyroidism currently.

## 2024-03-20 NOTE — Patient Instructions (Signed)
  VISIT SUMMARY: Today, we reviewed your thyroid  and iron levels, discussed your recent lightheadedness, and evaluated your progress with semaglutide for weight management. Your iron levels and thyroid  function are stable, and you have experienced significant improvement in your hemorrhoid condition. We also addressed your recent lightheadedness and discussed your weight loss progress.  YOUR PLAN: -IRON DEFICIENCY: Iron deficiency means your body doesn't have enough iron, which is important for making red blood cells. Your iron levels and blood counts have been checked, and missing occasional doses of your iron pill is not a major concern at this time.  -THYROID  DISORDER, UNSPECIFIED: Your thyroid  disorder is being managed well, and your thyroid  function remains stable under the current treatment plan.  -LIGHTHEADEDNESS: Your lightheadedness, especially in the mornings, is likely due to starting semaglutide. Your blood pressure and pulse are normal, so dehydration is not a concern. We will monitor your symptoms, and you should report any worsening.  -OBESITY, UNSPECIFIED: Obesity means having excess body weight. You have started semaglutide four weeks ago and have experienced weight loss without side effects. You will continue with the current dose to avoid constipation, and we will monitor your weight loss and adjust the dose if necessary. Keep up with your physical activity.  -GENERAL HEALTH MAINTENANCE: Routine follow-up and monitoring of your health conditions are scheduled. Continue with your current health regimen, and we will have a follow-up appointment in six months for a routine check-in.  INSTRUCTIONS: Please continue taking your medications as prescribed and monitor your symptoms. Report any worsening of your lightheadedness. Maintain your physical activity and follow your current health regimen. We will see you again in six months for a routine follow-up appointment.

## 2024-03-20 NOTE — Assessment & Plan Note (Signed)
 Recent history of iron deficiency due to pregnancy.  Currently using iron supplementation.  Still having some symptoms of lightheadedness which may or may not be related.  Will check iron levels today but likely can discontinue the iron supplements soon.

## 2024-03-20 NOTE — Assessment & Plan Note (Signed)
 Semaglutide was started four weeks ago with no side effects from a pharmacologist. She prefers the current dose to avoid constipation. The current dose of semaglutide will continue, with weight loss monitored and dose adjustments made if necessary. Continued physical activity is encouraged.

## 2024-03-21 LAB — CBC
HCT: 34.5 % — ABNORMAL LOW (ref 36.0–46.0)
Hemoglobin: 12.1 g/dL (ref 12.0–15.0)
MCHC: 35 g/dL (ref 30.0–36.0)
MCV: 86.5 fl (ref 78.0–100.0)
Platelets: 306 K/uL (ref 150.0–400.0)
RBC: 3.99 Mil/uL (ref 3.87–5.11)
RDW: 13.2 % (ref 11.5–15.5)
WBC: 10.9 K/uL — ABNORMAL HIGH (ref 4.0–10.5)

## 2024-03-21 LAB — IBC + FERRITIN
Ferritin: 48.9 ng/mL (ref 10.0–291.0)
Iron: 25 ug/dL — ABNORMAL LOW (ref 42–145)
Saturation Ratios: 7.5 % — ABNORMAL LOW (ref 20.0–50.0)
TIBC: 333.2 ug/dL (ref 250.0–450.0)
Transferrin: 238 mg/dL (ref 212.0–360.0)

## 2024-03-21 LAB — T4, FREE: Free T4: 0.72 ng/dL (ref 0.60–1.60)

## 2024-03-21 LAB — TSH: TSH: 1.54 u[IU]/mL (ref 0.35–5.50)

## 2024-03-22 ENCOUNTER — Ambulatory Visit: Payer: Self-pay | Admitting: Student in an Organized Health Care Education/Training Program

## 2024-03-27 NOTE — Progress Notes (Signed)
 Patient called back in about her menstrual cycle.SABRASABRA

## 2024-03-27 NOTE — Telephone Encounter (Signed)
 Copied from CRM #[id number]. Topic: Clinical - Lab/Test Results >> Mar 27, 2024 11:33 AM Brittany Johnston wrote: Reason for CRM: Patient called in stated periods have been heavy , stated she has always had low iron. Didn't know if that would make a difference  Patient has a few more questions regarding it would like for Dr.Vincent to give her a callback

## 2024-04-23 ENCOUNTER — Other Ambulatory Visit: Payer: Self-pay

## 2024-04-23 ENCOUNTER — Emergency Department (HOSPITAL_BASED_OUTPATIENT_CLINIC_OR_DEPARTMENT_OTHER)

## 2024-04-23 ENCOUNTER — Emergency Department (HOSPITAL_BASED_OUTPATIENT_CLINIC_OR_DEPARTMENT_OTHER)
Admission: EM | Admit: 2024-04-23 | Discharge: 2024-04-23 | Disposition: A | Discharge status: Home / Self Care | Attending: Emergency Medicine | Admitting: Emergency Medicine

## 2024-04-23 ENCOUNTER — Encounter (HOSPITAL_BASED_OUTPATIENT_CLINIC_OR_DEPARTMENT_OTHER): Payer: Self-pay

## 2024-04-23 DIAGNOSIS — D72829 Elevated white blood cell count, unspecified: Secondary | ICD-10-CM | POA: Insufficient documentation

## 2024-04-23 DIAGNOSIS — R10A2 Flank pain, left side: Secondary | ICD-10-CM | POA: Diagnosis present

## 2024-04-23 DIAGNOSIS — N132 Hydronephrosis with renal and ureteral calculous obstruction: Secondary | ICD-10-CM | POA: Insufficient documentation

## 2024-04-23 DIAGNOSIS — N201 Calculus of ureter: Secondary | ICD-10-CM

## 2024-04-23 LAB — COMPREHENSIVE METABOLIC PANEL WITH GFR
ALT: 27 U/L (ref 0–44)
AST: 25 U/L (ref 15–41)
Albumin: 4.9 g/dL (ref 3.5–5.0)
Alkaline Phosphatase: 91 U/L (ref 38–126)
Anion gap: 12 (ref 5–15)
BUN: 10 mg/dL (ref 6–20)
CO2: 24 mmol/L (ref 22–32)
Calcium: 10.2 mg/dL (ref 8.9–10.3)
Chloride: 103 mmol/L (ref 98–111)
Creatinine, Ser: 0.67 mg/dL (ref 0.44–1.00)
GFR, Estimated: >60 mL/min
Glucose, Bld: 114 mg/dL — ABNORMAL HIGH (ref 70–99)
Potassium: 4.2 mmol/L (ref 3.5–5.1)
Sodium: 139 mmol/L (ref 135–145)
Total Bilirubin: 0.4 mg/dL (ref 0.0–1.2)
Total Protein: 7.8 g/dL (ref 6.5–8.1)

## 2024-04-23 LAB — URINALYSIS, ROUTINE W REFLEX MICROSCOPIC
Bacteria, UA: NONE SEEN
Bilirubin Urine: NEGATIVE
Glucose, UA: NEGATIVE mg/dL
Ketones, ur: NEGATIVE mg/dL
Leukocytes,Ua: NEGATIVE
Nitrite: NEGATIVE
RBC / HPF: >50 RBC/hpf (ref 0–5)
Specific Gravity, Urine: 1.026 (ref 1.005–1.030)
pH: 6.5 (ref 5.0–8.0)

## 2024-04-23 LAB — CBC WITH DIFFERENTIAL/PLATELET
Abs Immature Granulocytes: 0.03 K/uL (ref 0.00–0.07)
Basophils Absolute: 0 K/uL (ref 0.0–0.1)
Basophils Relative: 0 %
Eosinophils Absolute: 0.2 K/uL (ref 0.0–0.5)
Eosinophils Relative: 2 %
HCT: 40.3 % (ref 36.0–46.0)
Hemoglobin: 13.6 g/dL (ref 12.0–15.0)
Immature Granulocytes: 0 %
Lymphocytes Relative: 59 %
Lymphs Abs: 6.5 K/uL — ABNORMAL HIGH (ref 0.7–4.0)
MCH: 29 pg (ref 26.0–34.0)
MCHC: 33.7 g/dL (ref 30.0–36.0)
MCV: 85.9 fL (ref 80.0–100.0)
Monocytes Absolute: 0.6 K/uL (ref 0.1–1.0)
Monocytes Relative: 5 %
Neutro Abs: 3.8 K/uL (ref 1.7–7.7)
Neutrophils Relative %: 34 %
Platelets: 308 K/uL (ref 150–400)
RBC: 4.69 MIL/uL (ref 3.87–5.11)
RDW: 12.6 % (ref 11.5–15.5)
WBC: 11.1 K/uL — ABNORMAL HIGH (ref 4.0–10.5)
nRBC: 0 % (ref 0.0–0.2)

## 2024-04-23 LAB — PREGNANCY, URINE: Preg Test, Ur: NEGATIVE

## 2024-04-23 MED ORDER — ONDANSETRON HCL 4 MG/2ML IJ SOLN
4.0000 mg | Freq: Once | INTRAMUSCULAR | Status: AC
  Administered 2024-04-23: 4 mg via INTRAVENOUS
  Filled 2024-04-23: qty 2

## 2024-04-23 MED ORDER — ONDANSETRON 4 MG PO TBDP: 4.0000 mg | ORAL_TABLET | Freq: Three times a day (TID) | ORAL | 0 refills | Status: AC | PRN

## 2024-04-23 MED ORDER — ONDANSETRON 4 MG PO TBDP
4.0000 mg | ORAL_TABLET | Freq: Once | ORAL | Status: AC
  Administered 2024-04-23: 4 mg via ORAL
  Filled 2024-04-23: qty 1

## 2024-04-23 MED ORDER — MORPHINE SULFATE (PF) 4 MG/ML IV SOLN
4.0000 mg | Freq: Once | INTRAVENOUS | Status: AC
  Administered 2024-04-23: 4 mg via INTRAVENOUS
  Filled 2024-04-23: qty 1

## 2024-04-23 MED ORDER — TAMSULOSIN HCL 0.4 MG PO CAPS: 0.4000 mg | ORAL_CAPSULE | Freq: Every day | ORAL | 0 refills | Status: AC

## 2024-04-23 MED ORDER — OXYCODONE-ACETAMINOPHEN 5-325 MG PO TABS: 1.0000 | ORAL_TABLET | Freq: Four times a day (QID) | ORAL | 0 refills | Status: AC | PRN

## 2024-04-23 MED ORDER — KETOROLAC TROMETHAMINE 15 MG/ML IJ SOLN
15.0000 mg | Freq: Once | INTRAMUSCULAR | Status: AC
  Administered 2024-04-23: 15 mg via INTRAVENOUS
  Filled 2024-04-23: qty 1

## 2024-04-23 NOTE — ED Triage Notes (Signed)
 Pt c/o kidney stone- severe pain in lower L back, I hurt extremely really bad. Pain onset this AM, went from a 10 to a 1000 in the last . Hx kidney stones, feels the same.

## 2024-04-23 NOTE — Discharge Instructions (Addendum)
 It was a pleasure taking care of you here today  Your CT scan showed a kidney stone which is likely the cause of your pain.  I have written you for a few medications to help with your symptoms.  Percocet-this is a pain medication.  It does contain an opiate which does have the addictive potential.  Only take as needed.  Do not drive, operate heavy machinery while taking this medication.  This medication may make you sleepy.  Use caution as this medication does contain 325 mg of Tylenol .  Do not take more than 4000 mg of Tylenol  in a 24-hour period Flomax -this is a medication to help dilate your ureters.  It also dilates your arteries.  Whenever you go from sitting to standing make sure to pump your legs as this medication may make blood pull in your legs and can cause some lightheadedness Zofran -this is a nausea medication.  Take as needed  You are given a urine strainer.  Make sure to follow-up with urology  Return for new or worsening symptoms

## 2024-04-23 NOTE — ED Provider Notes (Signed)
 " [place] [facility]ERGENCY DEPARTMENT AT [facility] Provider Note   CSN: [id number] Arrival date & time: 04/23/24  1342     Patient presents with: Kidney stone   RITA VIALPANDO is a 38 y.o. female here for evaluation of left flank pain.  Started earlier today.  Sudden in onset.  Noted dark-colored urine as well.  Feels like her previous kidney stone which happened 4 years ago.  Associated dysuria.  No fever.  Did have an episode of NBNB emesis at symptom onset.  No chest pain, shortness of breath, cough, changes in bowel movements.  Denies chance of pregnancy.  No known trauma or injury   HPI     Prior to Admission medications  Medication Sig Start Date End Date Taking? Authorizing Provider  ondansetron  (ZOFRAN -ODT) 4 MG disintegrating tablet Take 1 tablet (4 mg total) by mouth every 8 (eight) hours as needed. 04/23/24  Yes Camela Wich A, PA-C  oxyCODONE -acetaminophen  (PERCOCET/ROXICET) 5-325 MG tablet Take 1 tablet by mouth every 6 (six) hours as needed for severe pain (pain score 7-10). 04/23/24  Yes Alyas Creary A, PA-C  tamsulosin  (FLOMAX ) 0.4 MG CAPS capsule Take 1 capsule (0.4 mg total) by mouth daily for 14 days. 04/23/24 05/07/24 Yes Seniya Stoffers A, PA-C  Cholecalciferol 50 MCG (2000 UT) TABS Take 2,000 Units by mouth daily at 6 (six) AM.    [provider]  ferrous sulfate  325 (65 FE) MG EC tablet Take 325 mg by mouth.    [provider]  natalizumab  (TYSABRI ) 300 MG/15ML injection Infuse 15 mL every 4 weeks by intravenous route. 06/20/18   [provider]  nitroGLYCERIN  (NITROGLYN) 2 % ointment Apply a pea size amount to the perianal region (hemorrhoids) 2-3 times per day 01/17/24   Trine Raynell Moder, MD  Prenatal Vit-Fe Fumarate-FA (PRENATAL VITAMINS PO) Take 1 tablet by mouth daily.    [provider]  SEMAGLUTIDE [place] Inject 10 Units into the skin once a week.    [provider]    Allergies: Patient has no  known allergies.    Review of Systems  Constitutional: Negative.   HENT: Negative.    Respiratory: Negative.    Cardiovascular: Negative.   Gastrointestinal: Negative.   Genitourinary:  Positive for dysuria, flank pain and hematuria. Negative for decreased urine volume, pelvic pain, urgency, vaginal bleeding, vaginal discharge and vaginal pain.  Skin: Negative.   Neurological: Negative.   All other systems reviewed and are negative.   Updated Vital Signs BP 111/72 (BP Location: Right Arm)   Pulse 76   Temp (!) 97.5 F (36.4 C)   Resp 16   SpO2 97%   Physical Exam Vitals and nursing note reviewed.  Constitutional:      General: She is not in acute distress.    Appearance: She is well-developed. She is not ill-appearing, toxic-appearing or diaphoretic.  HENT:     Head: Normocephalic and atraumatic.     Nose: Nose normal.     Mouth/Throat:     Mouth: Mucous membranes are moist.  Eyes:     Pupils: Pupils are equal, round, and reactive to light.  Cardiovascular:     Rate and Rhythm: Normal rate.     Pulses: Normal pulses.     Heart sounds: Normal heart sounds.  Pulmonary:     Effort: Pulmonary effort is normal. No respiratory distress.     Breath sounds: Normal breath sounds.  Abdominal:     General: Bowel sounds are normal.  There is no distension.     Palpations: Abdomen is soft.     Tenderness: There is no abdominal tenderness. There is no right CVA tenderness, left CVA tenderness, guarding or rebound.  Musculoskeletal:        General: No swelling, tenderness, deformity or signs of injury. Normal range of motion.     Cervical back: Normal range of motion.  Skin:    General: Skin is warm and dry.     Capillary Refill: Capillary refill takes less than 2 seconds.  Neurological:     General: No focal deficit present.     Mental Status: She is alert.  Psychiatric:        Mood and Affect: Mood normal.     (all labs ordered are listed, but only abnormal results are  displayed) Labs Reviewed  COMPREHENSIVE METABOLIC PANEL WITH GFR - Abnormal; Notable for the following components:      Result Value   Glucose, Bld 114 (*)    All other components within normal limits  CBC WITH DIFFERENTIAL/PLATELET - Abnormal; Notable for the following components:   WBC 11.1 (*)    Lymphs Abs 6.5 (*)    All other components within normal limits  URINALYSIS, ROUTINE W REFLEX MICROSCOPIC - Abnormal; Notable for the following components:   APPearance HAZY (*)    Hgb urine dipstick LARGE (*)    Protein, ur TRACE (*)    Non Squamous Epithelial 0-5 (*)    All other components within normal limits  URINE CULTURE  PREGNANCY, URINE    EKG: None  Radiology: CT Renal Stone Study Result Date: 04/23/2024 CLINICAL DATA:  Left-sided back pain nausea EXAM: CT ABDOMEN AND PELVIS WITHOUT CONTRAST TECHNIQUE: Multidetector CT imaging of the abdomen and pelvis was performed following the standard protocol without IV contrast. RADIATION DOSE REDUCTION: This exam was performed according to the departmental dose-optimization program which includes automated exposure control, adjustment of the mA and/or kV according to patient size and/or use of iterative reconstruction technique. COMPARISON:  CT 02/02/2021 FINDINGS: Lower chest: Lung bases are clear Hepatobiliary: Gallstones. No focal hepatic abnormality or biliary dilatation Pancreas: Unremarkable. No pancreatic ductal dilatation or surrounding inflammatory changes. Spleen: Normal in size without focal abnormality. Adrenals/Urinary Tract: Adrenal glands are normal. There are multiple left kidney stones, measuring up to 5 mm in size. Minimal left hydronephrosis and hydroureter, secondary to a 4 mm stone at the left UVJ. Bladder is unremarkable Stomach/Bowel: Stomach is within normal limits. Appendix appears normal. No evidence of bowel wall thickening, distention, or inflammatory changes. Vascular/Lymphatic: No significant vascular findings are  present. No enlarged abdominal or pelvic lymph nodes. Reproductive: Uterus and bilateral adnexa are unremarkable. Other: No ascites or free air Musculoskeletal: No acute or suspicious osseous abnormality IMPRESSION: 1. Minimal left hydronephrosis and hydroureter, secondary to a 4 mm stone at the left UVJ. 2. Multiple left kidney stones. 3. Gallstones. Electronically Signed   By: Luke Bun M.D.   On: 04/23/2024 17:49     Procedures   Medications Ordered in the ED  ketorolac  (TORADOL ) 15 MG/ML injection 15 mg (has no administration in time range)  ondansetron  (ZOFRAN -ODT) disintegrating tablet 4 mg (4 mg Oral Given 04/23/24 1357)  morphine  (PF) 4 MG/ML injection 4 mg (4 mg Intravenous Given 04/23/24 1626)  ondansetron  (ZOFRAN ) injection 4 mg (4 mg Intravenous Given 04/23/24 3337)   68 year old history of kidney stone here for evaluation of left flank pain.  Started earlier today.  Described as severe.  Associated  hematuria, dysuria.  Feels consistent with her prior kidney stone.  No known trauma or injury.  No changes in bowel movements.  No chest pain, shortness of breath, URI symptoms.  Here she has no obvious rashes or lesions.  Her heart and lungs are clear.  She is PECARN low risk.  Abdomen soft, nontender.  Will plan labs, imaging, reassess  Labs and imaging personally viewed and interpreted:  CBC leukocytosis 11.1 Metabolic panel without significant abnormality UA large blood, nitrate, leuks, no bacteria however 21-50 WBC.  Otherwise clean-catch.  Will add on a culture Pregnancy test neg CT stone study 4 mm left UPJ stone with hydroureter and hydronephrosis.  Multiple left kidney stones, gallstone  Patient reassessed.  Pain controlled.  Will give Toradol .  Discussed labs and imaging.  Urine sent for culture given elevated WBC in urine.  Will hold on antibiotics at this time.  Pain controlled here.  Tolerating p.o. intake.  Will have her follow-up outpatient, return for new or worsening  symptom  On repeat exam patient does not have a surgical abdomin and there are no peritoneal signs.  No indication of appendicitis, bowel obstruction, bowel perforation, cholecystitis, diverticulitis, PID, intermittent, persistent torsion, TOA, ectopic pregnancy, AAA, dissection, traumatic injury, infected kidney stone.  Patient discharged home with symptomatic treatment and given strict instructions for follow-up with their primary care physician.  I have also discussed reasons to return immediately to the ER.  Patient expresses understanding and agrees with plan.                                    Medical Decision Making Amount and/or Complexity of Data Reviewed Independent Historian: parent External Data Reviewed: labs, radiology and notes. Labs: ordered. Decision-making details documented in ED Course. Radiology: ordered and independent interpretation performed. Decision-making details documented in ED Course.  Risk OTC drugs. Prescription drug management. Decision regarding hospitalization. Diagnosis or treatment significantly limited by social determinants of health.        Final diagnoses:  Obstruction of left ureteropelvic junction (UPJ) due to stone    ED Discharge Orders          Ordered    tamsulosin  (FLOMAX ) 0.4 MG CAPS capsule  Daily        04/23/24 1808    oxyCODONE -acetaminophen  (PERCOCET/ROXICET) 5-325 MG tablet  Every 6 hours PRN        04/23/24 1808    ondansetron  (ZOFRAN -ODT) 4 MG disintegrating tablet  Every 8 hours PRN        04/23/24 1808               Daeshaun Specht A, PA-C 04/23/24 1821  "

## 2024-04-25 LAB — URINE CULTURE

## 2024-05-10 ENCOUNTER — Telehealth: Payer: Self-pay | Admitting: Neurology

## 2024-05-10 DIAGNOSIS — G35A Relapsing-remitting multiple sclerosis: Secondary | ICD-10-CM

## 2024-05-10 NOTE — Telephone Encounter (Signed)
 I left her a voice mail to call me back to schedule the MRI at [facility]

## 2024-05-10 NOTE — Telephone Encounter (Signed)
 Brittany Johnston this pt was last seen by Sater in July and she had a baby in August. He told her that after the baby she would need an MRI, but [name] has no order for it. Can you check to see if he still wants her to have so [name] can contact her. TY

## 2024-05-10 NOTE — Addendum Note (Signed)
 Addended by: VEAR ADE A on: 05/10/2024 12:58 PM   Modules accepted: Orders

## 2024-05-16 ENCOUNTER — Ambulatory Visit: Admitting: Neurology

## 2024-05-16 ENCOUNTER — Encounter: Payer: Self-pay | Admitting: Neurology

## 2024-05-16 VITALS — BP 100/62 | HR 77 | Ht 60.5 in | Wt 166.5 lb

## 2024-05-16 DIAGNOSIS — G35A Relapsing-remitting multiple sclerosis: Secondary | ICD-10-CM

## 2024-05-16 DIAGNOSIS — R252 Cramp and spasm: Secondary | ICD-10-CM

## 2024-05-16 DIAGNOSIS — Z79899 Other long term (current) drug therapy: Secondary | ICD-10-CM

## 2024-05-16 DIAGNOSIS — O99891 Other specified diseases and conditions complicating pregnancy: Secondary | ICD-10-CM

## 2024-05-16 NOTE — Progress Notes (Signed)
 "  [facility]  PATIENT: Brittany Johnston DOB: December 03, 1985  REFERRING DOCTOR OR PCP:  Ozell Mirza [facility])      _________________________________   HISTORICAL  CHIEF COMPLAINT:  Chief Complaint  Patient presents with   Follow-up    Pt in room 24. 64 month old baby in room. Here for MS follow up. Pt wants to discuss getting MRI post baby.     HISTORY OF PRESENT ILLNESS:  Brittany Johnston is a 39 y.o. woman with relapsing remitting MS.     Update 05/16/2024 She delivered C-section 12/05/2023).    During the pregnancy, she was on glatiramer . She took one dose of Tysabri  in November (a complimentary dose) but did not do one in December and insurance has now changed.   SHe tolerates it well and has no exacerbation.  . She is JCV antibody negative.     She feels her MS is stable.  No exacerbation or new symptom.     Gait is doing well.   Balance is good but she holds bannister for safety.. She feels unsafe going downstairs so uses the bannister.   Limbs are strong but she gets leg cramps frequently.  Toes sometimes cramp up.   No dysesthesias.   Bladder function is fine.  Vision is normal and symmetric.  Color vision is symmetric.  She notes stable mild reduced focus and STM,   She sometimes has word finding errors.  She notes fatigue., probably worse the past couple months.  She has insomnia some nights.  She sometimes has to get up at night because of the children.  She denies depression     She has a daughter just turns 5 and son is 3 and daughter 4 months..      She han injection for right CTS in 2022 that helped       MS History   Around 05/20/2016, she had the onset of left eye pain, worse with movement.   Symptoms started while driving and she noted looking to merge was painful. She saw her ophthalmologist and she had an MRI 06/03/2016 showing optic nerve enhancement on the left and a second focus in the right hemisphere that also enhanced.     She noted mild  blurry vision shortly after that but it seemed worse 06/05/2016 and the next day, vision was worse.    She went to the [facility] ED and was admitted for 5 days of IV Solu-Medrol  (3 days in hospital, 2 as outpatient).  There, she had an MRI of the cervical spine that showed 2 more lesions (at C4C5 and T1) consistent with MS.  In retrospect, a few weeks before the visual symptoms, she noted right hand numbness, especially if she hold her phone which is persisting.     I have reviewed the MRI of the brain dated 06/03/2016 and the MRI of the cervical spine dated 06/08/2016.   The MRI of the brain showed enhancement of the left optic nerve. Additionally in the right temporal lobes there is another T2/FLAIR hyperintense focus that enhances. Additionally there is a periventricular right cerebellar focus that does not enhance. I also reviewed the MRI of the cervical spine. At C3-C4 there is a right lateral focus with subtle enhancement. There is also a right posterolateral focus adjacent to T1 that does not enhance.   Lab reports from her recent hospital stay were also evaluated. He is JCV antibody negative. Anti-NMO is negative.  She started Tysabri  in March 2018.SABRA  She was started on Tysabri  and did well.  She discontinued Tysabri  summer 2020 after becoming pregnant.  She started Copaxone .  MRI of the brain after delivery March 2021 showed one new lesion that was not there previously.  She went back on Tysabri  after 01/2021 delivery (son Feliciano)  Imaging studies: MRI od the brain 01/2021 showed T2/FLAIR hyperintense foci in the cerebellum and cerebral hemispheres in a pattern consistent with chronic demyelinating plaque associated with multiple sclerosis.  None of the foci enhanced or appear to be acute.  Compared to the previous MRIs, there are no new lesions.  MRI of the brain 07/09/2019 shows T2/flair hyperintense foci in the right cerebellar hemisphere and in the cerebral hemispheres in a pattern configuration consistent  with chronic demyelinating plaque associated with multiple sclerosis.  None of the foci enhance or appear to be acute.  One focus in the right temporal lobe was not apparent on the previous MRI from 08/16/2018.  I of the brain 08/16/2018 shows T2/flair hyperintense focus adjacent to the fourth ventricle in the right cerebellum and a couple smaller foci in the periventricular deep white matter of the hemispheres consistent with chronic demyelinating plaque associated with multiple sclerosis.  When compared to the MRI dated 09/21/2017, there are no new lesions.  MRI of the cervical spine 09/21/2017 shows small foci within the spinal cord posteriorly adjacent to C2-C3, to the right adjacent to C4 and posteriorly to the right adjacent to T1.  All these foci were present on the 06/08/2016 MRI and were more apparent on the previous MRI than the current scan.  These are consistent with small chronic demyelinating plaques associated with multiple sclerosis   REVIEW OF SYSTEMS: Constitutional: No fevers, chills, sweats, or change in appetite.   She denies much fatigue. Eyes: No visual changes, double vision, eye pain Ear, nose and throat: No hearing loss, ear pain, nasal congestion, sore throat Cardiovascular: No chest pain, palpitations Respiratory:  No shortness of breath at rest or with exertion.   No wheezes GastrointestinaI: No nausea, vomiting, diarrhea, abdominal pain, fecal incontinence,    Has constipation Genitourinary:  No dysuria, urinary retention or frequency.  No nocturia.  Has had some yeast infections.  Musculoskeletal:  No neck pain, back pain Integumentary: No rash, pruritus, skin lesions Neurological: as above Psychiatric: Mood is doing well. Endocrine: No palpitations, diaphoresis, change in appetite, change in weigh or increased thirst Hematologic/Lymphatic:  No anemia, purpura, petechiae. Allergic/Immunologic: No itchy/runny eyes, nasal congestion, recent allergic reactions,  rashes  ALLERGIES: No Known Allergies  HOME MEDICATIONS:  Current Outpatient Medications:    Cholecalciferol 50 MCG (2000 UT) TABS, Take 2,000 Units by mouth daily at 6 (six) AM., Disp: , Rfl:    ferrous sulfate  325 (65 FE) MG EC tablet, Take 325 mg by mouth., Disp: , Rfl:    natalizumab  (TYSABRI ) 300 MG/15ML injection, Infuse 15 mL every 4 weeks by intravenous route., Disp: , Rfl:    Prenatal Vit-Fe Fumarate-FA (PRENATAL VITAMINS PO), Take 1 tablet by mouth daily., Disp: , Rfl:    SEMAGLUTIDE [place], Inject 10 Units into the skin once a week., Disp: , Rfl:    nitroGLYCERIN  (NITROGLYN) 2 % ointment, Apply a pea size amount to the perianal region (hemorrhoids) 2-3 times per day (Patient not taking: Reported on 05/16/2024), Disp: 30 g, Rfl: 0   ondansetron  (ZOFRAN -ODT) 4 MG disintegrating tablet, Take 1 tablet (4 mg total) by mouth every 8 (eight) hours as needed. (Patient not taking: Reported on 05/16/2024), Disp:  20 tablet, Rfl: 0   oxyCODONE -acetaminophen  (PERCOCET/ROXICET) 5-325 MG tablet, Take 1 tablet by mouth every 6 (six) hours as needed for severe pain (pain score 7-10). (Patient not taking: Reported on 05/16/2024), Disp: 15 tablet, Rfl: 0  PAST MEDICAL HISTORY: Past Medical History:  Diagnosis Date   Abnormal auditory perception of both ears 06/20/2018   ADD (attention deficit disorder) 12/28/2017   Anxiety    Calculus of gallbladder without cholecystitis without obstruction 02/14/2021   Depression    Hemorrhoids 02/14/2021   History of cesarean section 01/08/2024   G1= 05/21/19, 39.5wga, SROM, c/s for arrest of descent, F, Brittany Johnston, 8#2oz, [place] (had pp exploratory laparotomy/cystoscopy d/t incisional bleeding)///G2= 01/09/21, 39wga, sched rpt c/s, M, Brittany Johnston, Brittany Johnston     History of renal calculi 01/08/2024   passed 1, still has 1     Irritable bowel syndrome    Multiple sclerosis    Multiple sclerosis    Optic neuritis 06/28/2016   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   Vaginal  Pap smear, abnormal    Vision abnormalities     PAST SURGICAL HISTORY: Past Surgical History:  Procedure Laterality Date   CESAREAN SECTION N/A 05/21/2019   Procedure: CESAREAN SECTION;  Surgeon: [place] Kelly Nest, MD;  Location: [facility] LD ORS;  Service: Obstetrics;  Laterality: N/A;   CESAREAN SECTION N/A 01/09/2021   Procedure: REPEAT CESAREAN SECTION EDC: 01-16-21 ALLERG: NKDA  PREVIOUS X 1;  Surgeon: Marne Kelly Nest, MD;  Location: [facility] LD ORS;  Service: Obstetrics;  Laterality: N/A;   CESAREAN SECTION N/A 12/05/2023   Procedure: CESAREAN DELIVERY;  Surgeon: Marne Kelly Nest, MD;  Location: [facility] LD ORS;  Service: Obstetrics;  Laterality: N/A;   CYSTOSCOPY N/A 05/22/2019   Procedure: CYSTOSCOPY;  Surgeon: Marne Kelly Nest, MD;  Location: [facility] LD ORS;  Service: Gynecology;  Laterality: N/A;   LAPAROTOMY N/A 05/22/2019   Procedure: LAPAROTOMY;  Surgeon: Marne Kelly Nest, MD;  Location: [facility] LD ORS;  Service: Gynecology;  Laterality: N/A;   LEEP     suppose to have follow up of cervical cells in January   MYRINGOTOMY     TYMPANOPLASTY     WISDOM TOOTH EXTRACTION      FAMILY HISTORY: Family History  Problem Relation Age of Onset   Heart disease Mother    Colon polyps Father    Prostate cancer Father    Diabetes Maternal Grandmother    Leukemia Paternal Grandfather    Colon cancer Neg Hx    Esophageal cancer Neg Hx    Inflammatory bowel disease Neg Hx    Liver disease Neg Hx    Pancreatic cancer Neg Hx    Rectal cancer Neg Hx     SOCIAL HISTORY:  Social History   Socioeconomic History   Marital status: Married    Spouse name: Not on file   Number of children: Not on file   Years of education: Not on file   Highest education level: Not on file  Occupational History   Not on file  Tobacco Use   Smoking status: Never   Smokeless tobacco: Never  Vaping Use   Vaping status: Never Used  Substance and Sexual Activity   Alcohol use: Not Currently    Comment:  occasional   Drug use: No   Sexual activity: Yes    Birth control/protection: None  Other Topics Concern   Not on file  Social History Narrative   Not on file   Social Drivers of Health   Tobacco  Use: Low Risk (05/16/2024)   Patient History    Smoking Tobacco Use: Never    Smokeless Tobacco Use: Never    Passive Exposure: Not on file  Financial Resource Strain: Low Risk (08/29/2023)   Received from [facility]   Overall Financial Resource Strain (CARDIA)    Difficulty of Paying Living Expenses: Not hard at all  Food Insecurity: No Food Insecurity (12/07/2023)   Epic    Worried About Radiation Protection Practitioner of Food in the Last Year: Never true    Ran Out of Food in the Last Year: Never true  Transportation Needs: No Transportation Needs (12/07/2023)   Epic    Lack of Transportation (Medical): No    Lack of Transportation (Non-Medical): No  Physical Activity: Not on file  Stress: Not on file  Social Connections: Not on file  Intimate Partner Violence: Not At Risk (12/07/2023)   Epic    Fear of Current or Ex-Partner: No    Emotionally Abused: No    Physically Abused: No    Sexually Abused: No  Depression (PHQ2-9): Low Risk (01/30/2024)   Depression (PHQ2-9)    PHQ-2 Score: 0  Alcohol Screen: Not on file  Housing: Unknown (02/01/2024)   Received from [facility]   Epic    At any time in the past 12 months, were you homeless or living in a shelter (including now)?: No    Number of Times Moved in the Last Year: Not on file    Unable to Pay for Housing in the Last Year: Not on file  Utilities: Not At Risk (12/07/2023)   Epic    Threatened with loss of utilities: No  Health Literacy: Not on file     PHYSICAL EXAM  BP 100/62 (BP Location: Right Arm, Patient Position: Sitting, Cuff Size: Normal)   Pulse 77   Ht 5' 0.5 (1.537 m)   Wt 166 lb 8 oz (75.5 kg)   SpO2 98%   BMI 31.98 kg/m    Body mass index is 31.98 kg/m.   General: The patient is well-developed and  well-nourished and in no acute distress   HEENT:   Head is normocephalic and atraumatic.    Neurologic Exam  Mental status: She has a good affect.. The patient is alert and oriented x 3 at the time of the examination. The patient has apparent normal recent and remote memory, with an apparently normal attention span and concentration ability.   Speech is normal.  Cranial nerves: Extraocular movements are full.  Vision was symmetric.  Color vision was symmetric..  Facial strength and sensation is normal.  Trapezius strength is normal.  No obvious hearing deficits are noted.  Motor:  Muscle bulk is normal.   Tone is normal. Strength is  5 / 5 in all 4 extremities.   Sensory: Normal sensation to touch and vibration in the arms and legs.   Gait and station: Station is normal.  The gait is normal.  Tandem gait is mildly wide.  Romberg is negative.    Reflexes: Deep tendon reflexes are symmetric and normal bilaterally.        ASSESSMENT AND PLAN  Multiple sclerosis, relapsing-remitting - Plan: MR BRAIN W WO CONTRAST, Stratify JCV(TM) Ab w/Index, CBC with Differential/Platelet  High risk medication use - Plan: Stratify JCV(TM) Ab w/Index, CBC with Differential/Platelet  Leg cramps in pregnancy    1. She needs to get back on  Tysabri .  Her MS has been very stable while on this  DMT.   Recheck MRI and JCV Ab 2.  If insomnia worsens again, can take low dose melatonin 3.   Stay active and exercise as tolerated.   4.   Return in 6 months or sooner if there are new or worsening neurologic symptoms and depending on options of treatment.       Malayiah Mcbrayer A. Vear, MD, PhD 05/16/2024, 1:37 PM   Certified in Neurology, Clinical Neurophysiology, Sleep Medicine, Pain Medicine and Neuroimaging  [facility] [address] [place], [place] [address] [phone] "

## 2024-05-24 ENCOUNTER — Ambulatory Visit: Payer: Self-pay | Admitting: Neurology

## 2024-05-24 LAB — CBC WITH DIFFERENTIAL/PLATELET
Basophils Absolute: 0 x10E3/uL (ref 0.0–0.2)
Basos: 0 %
EOS (ABSOLUTE): 0.1 x10E3/uL (ref 0.0–0.4)
Eos: 2 %
Hematocrit: 42.4 % (ref 34.0–46.6)
Hemoglobin: 13.8 g/dL (ref 11.1–15.9)
Immature Grans (Abs): 0 x10E3/uL (ref 0.0–0.1)
Immature Granulocytes: 0 %
Lymphocytes Absolute: 4.3 x10E3/uL — ABNORMAL HIGH (ref 0.7–3.1)
Lymphs: 60 %
MCH: 29.2 pg (ref 26.6–33.0)
MCHC: 32.5 g/dL (ref 31.5–35.7)
MCV: 90 fL (ref 79–97)
Monocytes Absolute: 0.3 x10E3/uL (ref 0.1–0.9)
Monocytes: 4 %
Neutrophils Absolute: 2.4 x10E3/uL (ref 1.4–7.0)
Neutrophils: 34 %
Platelets: 262 x10E3/uL (ref 150–450)
RBC: 4.72 x10E6/uL (ref 3.77–5.28)
RDW: 12.5 % (ref 11.7–15.4)
WBC: 7.2 x10E3/uL (ref 3.4–10.8)

## 2024-05-24 LAB — STRATIFY JCV(TM) AB W/INDEX
JCV Antibody: NEGATIVE
JCV Index Value: 0.18

## 2024-05-29 ENCOUNTER — Other Ambulatory Visit

## 2024-06-05 ENCOUNTER — Telehealth: Payer: Self-pay | Admitting: *Deleted

## 2024-06-05 NOTE — Telephone Encounter (Signed)
 Mliss w/ [employer] states she emailed pt back. She has been approved.

## 2024-06-06 NOTE — Telephone Encounter (Signed)
 Questionnaire faxed to [employer] @ [phone]. Received a receipt of confirmation.

## 2024-06-13 ENCOUNTER — Other Ambulatory Visit

## 2024-09-17 ENCOUNTER — Ambulatory Visit: Admitting: Student in an Organized Health Care Education/Training Program

## 2024-11-05 ENCOUNTER — Ambulatory Visit: Admitting: Neurology
